# Patient Record
Sex: Male | Born: 1975 | Race: Black or African American | Hispanic: No | Marital: Single | Smoking: Current every day smoker
Health system: Southern US, Community
[De-identification: ages and names within clinical notes are randomized; demographics above are authoritative.]

## PROBLEM LIST (undated history)

## (undated) DIAGNOSIS — I10 Essential (primary) hypertension: Secondary | ICD-10-CM

## (undated) DIAGNOSIS — F329 Major depressive disorder, single episode, unspecified: Secondary | ICD-10-CM

## (undated) DIAGNOSIS — E119 Type 2 diabetes mellitus without complications: Secondary | ICD-10-CM

## (undated) DIAGNOSIS — F32A Depression, unspecified: Secondary | ICD-10-CM

---

## 2017-12-14 ENCOUNTER — Other Ambulatory Visit: Payer: Self-pay

## 2017-12-14 ENCOUNTER — Encounter (HOSPITAL_COMMUNITY): Payer: Self-pay | Admitting: *Deleted

## 2017-12-14 ENCOUNTER — Emergency Department (HOSPITAL_COMMUNITY)
Admission: EM | Admit: 2017-12-14 | Discharge: 2017-12-15 | Disposition: A | Discharge status: Other Acute Inpatient Hospital | Payer: BLUE CROSS/BLUE SHIELD | Attending: Emergency Medicine | Admitting: Emergency Medicine

## 2017-12-14 DIAGNOSIS — F333 Major depressive disorder, recurrent, severe with psychotic symptoms: Secondary | ICD-10-CM | POA: Insufficient documentation

## 2017-12-14 DIAGNOSIS — R45851 Suicidal ideations: Secondary | ICD-10-CM | POA: Insufficient documentation

## 2017-12-14 DIAGNOSIS — F329 Major depressive disorder, single episode, unspecified: Secondary | ICD-10-CM | POA: Diagnosis present

## 2017-12-14 HISTORY — DX: Depression, unspecified: F32.A

## 2017-12-14 HISTORY — DX: Major depressive disorder, single episode, unspecified: F32.9

## 2017-12-14 LAB — COMPREHENSIVE METABOLIC PANEL
ALBUMIN: 3.8 g/dL (ref 3.5–5.0)
ALT: 37 U/L (ref 17–63)
AST: 36 U/L (ref 15–41)
Alkaline Phosphatase: 65 U/L (ref 38–126)
Anion gap: 16 — ABNORMAL HIGH (ref 5–15)
BUN: 10 mg/dL (ref 6–20)
CHLORIDE: 98 mmol/L — AB (ref 101–111)
CO2: 23 mmol/L (ref 22–32)
CREATININE: 1.01 mg/dL (ref 0.61–1.24)
Calcium: 8.9 mg/dL (ref 8.9–10.3)
GFR calc non Af Amer: >60 mL/min (ref >60)
GLUCOSE: 94 mg/dL (ref 65–99)
Potassium: 3.6 mmol/L (ref 3.5–5.1)
SODIUM: 137 mmol/L (ref 135–145)
Total Bilirubin: 0.7 mg/dL (ref 0.3–1.2)
Total Protein: 7.4 g/dL (ref 6.5–8.1)

## 2017-12-14 LAB — SALICYLATE LEVEL

## 2017-12-14 LAB — CBC
HEMATOCRIT: 45.7 % (ref 39.0–52.0)
HEMOGLOBIN: 14.9 g/dL (ref 13.0–17.0)
MCH: 29.5 pg (ref 26.0–34.0)
MCHC: 32.6 g/dL (ref 30.0–36.0)
MCV: 90.5 fL (ref 78.0–100.0)
Platelets: 383 10*3/uL (ref 150–400)
RBC: 5.05 MIL/uL (ref 4.22–5.81)
RDW: 14.7 % (ref 11.5–15.5)
WBC: 7.8 10*3/uL (ref 4.0–10.5)

## 2017-12-14 LAB — ACETAMINOPHEN LEVEL: Acetaminophen (Tylenol), Serum: <10 ug/mL — ABNORMAL LOW (ref 10–30)

## 2017-12-14 LAB — ETHANOL: Alcohol, Ethyl (B): 88 mg/dL — ABNORMAL HIGH (ref <10)

## 2017-12-14 MED ORDER — ONDANSETRON HCL 4 MG PO TABS: 4.0000 mg | ORAL_TABLET | Freq: Three times a day (TID) | ORAL | Status: DC | PRN

## 2017-12-14 MED ORDER — ACETAMINOPHEN 325 MG PO TABS: 650.0000 mg | ORAL_TABLET | ORAL | Status: DC | PRN

## 2017-12-14 MED ORDER — ZOLPIDEM TARTRATE 5 MG PO TABS: 5.0000 mg | ORAL_TABLET | Freq: Every evening | ORAL | Status: DC | PRN

## 2017-12-14 NOTE — ED Notes (Addendum)
Called patient for triage, no answer

## 2017-12-14 NOTE — ED Provider Notes (Signed)
[facility] EMERGENCY [facility]PARTMENT Provider Note   CSN: [id number] Arrival date & time: 12/14/17  1526     History   Chief Complaint Chief Complaint  Patient presents with  . Suicidal  . Homicidal    HPI Aaron [name] is a 42 y.o. male.  Patient presents today for mental health evaluation. He endorses a prior history of depression, currently non-compliant with medications. He reports that he wants to kill himself, and placed a hand gun to head several times. He also endorses that he wants to harm others.   The history is provided by the patient. No language interpreter was used.  Mental Health Problem  Presenting symptoms: homicidal ideas, suicidal thoughts and suicidal threats   Degree of incapacity (severity):  Moderate Onset quality:  Unable to specify Progression:  Worsening Chronicity:  Recurrent Context: alcohol use   Treatment compliance:  Some of the time Time since last psychoactive medication taken:  2 months Associated symptoms: feelings of worthlessness and poor judgment   Risk factors: hx of mental illness     Past Medical History:  Diagnosis Date  . Depression     There are no active problems to display for this patient.   History reviewed. No pertinent surgical history.     Home Medications    Prior to Admission medications   Not on File    Family History History reviewed. No pertinent family history.  Social History Social History   Tobacco Use  . Smoking status: Never Smoker  Substance Use Topics  . Alcohol use: Yes    Comment: heavy  . Drug use: No     Allergies   Patient has no known allergies.   Review of Systems Review of Systems  Psychiatric/Behavioral: Positive for homicidal ideas and suicidal ideas.  All other systems reviewed and are negative.    Physical Exam Updated Vital Signs BP 132/85 (BP Location: Right Arm)   Pulse (!) 120   Temp 98.8 F (37.1 C) (Oral)   Resp 18   SpO2  96%   Physical Exam  Constitutional: He is oriented to person, place, and time. He appears well-developed.  HENT:  Head: Atraumatic.  Eyes: Conjunctivae are normal.  Neck: Neck supple.  Cardiovascular: Normal rate and regular rhythm.  Pulmonary/Chest: Effort normal and breath sounds normal.  Abdominal: Soft. Bowel sounds are normal.  Musculoskeletal: Normal range of motion.  Lymphadenopathy:    He has no cervical adenopathy.  Neurological: He is alert and oriented to person, place, and time.  Skin: Skin is warm and dry.  Psychiatric: His speech is normal. He is withdrawn. Cognition and memory are normal. He expresses impulsivity. He exhibits a depressed mood. He expresses homicidal and suicidal ideation. He expresses suicidal plans.  Nursing note and vitals reviewed.    ED Treatments / Results  Labs (all labs ordered are listed, but only abnormal results are displayed) Labs Reviewed  COMPREHENSIVE METABOLIC PANEL - Abnormal; Notable for the following components:      Result Value   Chloride 98 (*)    Anion gap 16 (*)    All other components within normal limits  ETHANOL - Abnormal; Notable for the following components:   Alcohol, Ethyl (B) 88 (*)    All other components within normal limits  ACETAMINOPHEN LEVEL - Abnormal; Notable for the following components:   Acetaminophen (Tylenol), Serum <10 (*)    All other components within normal limits  SALICYLATE LEVEL  CBC  RAPID URINE  DRUG SCREEN, HOSP PERFORMED    EKG  EKG Interpretation None       Radiology No results found.  Procedures Procedures (including critical care time)  Medications Ordered in ED Medications - No data to display   Initial Impression / Assessment and Plan / ED Course  I have reviewed the triage vital signs and the nursing notes.  Pertinent labs & imaging results that were available during my care of the patient were reviewed by me and considered in my medical decision making (see chart  for details).     Patient has been evaluated by TTS. Suicidal with psychosis. Meets criteria for inpatient admission and treatment. TTS working on placement. PATIENT IS CURRENTLY VOLUNTARILY SEEKING HELP, BUT SHOULD HE DECIDED TO LEAVE BEFORE ADMISSION, HE WILL NEED TO BE IVC'ED.  Final Clinical Impressions(s) / ED Diagnoses   Final diagnoses:  Suicidal ideation    ED Discharge Orders    None       Felicie Morn, NP 12/14/17 2114    Arby Barrette, MD 12/19/17 1712

## 2017-12-14 NOTE — ED Triage Notes (Signed)
Pt reports having thoughts of harming himself and others. Reports putting gun to his head multiple times today. Hx of same but denies taking meds as prescribed. Calm and cooperative at this time. Reports heavy ETOH use, last drank this am.
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
## 2017-12-14 NOTE — BH Assessment (Signed)
[facility] Assessment Progress Note   Sam[facility] called to say that they are accepting patient for voluntary admission.  Accepting physician is Dr. Merideth AbbeyEnrique Lopez.  [facility] and they can take him after 10:00 on 01/25.  Nurse call report to [phone].  Nurse Madison at [facility] notified.

## 2017-12-14 NOTE — Care Management (Signed)
Writer referred [facility] to the following [facility]: [facility], [facility], [facility], [facility], [facility], [facility], [facility]

## 2017-12-14 NOTE — ED Notes (Signed)
Ordered diet tray 

## 2017-12-14 NOTE — ED Notes (Signed)
Pt in paper scrubs, wanded at triage, belongings removed and called for a sitter.

## 2017-12-15 ENCOUNTER — Other Ambulatory Visit: Payer: Self-pay

## 2017-12-15 ENCOUNTER — Encounter (HOSPITAL_COMMUNITY): Payer: Self-pay

## 2017-12-15 ENCOUNTER — Inpatient Hospital Stay (HOSPITAL_COMMUNITY)
Admission: AD | Admit: 2017-12-15 | Discharge: 2017-12-22 | DRG: 885 | Disposition: A | Discharge status: Home / Self Care | Payer: BLUE CROSS/BLUE SHIELD | Source: Intra-hospital | Attending: Psychiatry | Admitting: Psychiatry

## 2017-12-15 DIAGNOSIS — F410 Panic disorder [episodic paroxysmal anxiety] without agoraphobia: Secondary | ICD-10-CM | POA: Diagnosis present

## 2017-12-15 DIAGNOSIS — Y904 Blood alcohol level of 80-99 mg/100 ml: Secondary | ICD-10-CM | POA: Diagnosis present

## 2017-12-15 DIAGNOSIS — F419 Anxiety disorder, unspecified: Secondary | ICD-10-CM | POA: Diagnosis not present

## 2017-12-15 DIAGNOSIS — R45851 Suicidal ideations: Secondary | ICD-10-CM | POA: Diagnosis present

## 2017-12-15 DIAGNOSIS — R450 Nervousness: Secondary | ICD-10-CM | POA: Diagnosis not present

## 2017-12-15 DIAGNOSIS — Z813 Family history of other psychoactive substance abuse and dependence: Secondary | ICD-10-CM | POA: Diagnosis not present

## 2017-12-15 DIAGNOSIS — Z590 Homelessness: Secondary | ICD-10-CM

## 2017-12-15 DIAGNOSIS — F17200 Nicotine dependence, unspecified, uncomplicated: Secondary | ICD-10-CM | POA: Diagnosis present

## 2017-12-15 DIAGNOSIS — G4700 Insomnia, unspecified: Secondary | ICD-10-CM | POA: Diagnosis present

## 2017-12-15 DIAGNOSIS — F333 Major depressive disorder, recurrent, severe with psychotic symptoms: Secondary | ICD-10-CM | POA: Diagnosis present

## 2017-12-15 DIAGNOSIS — F1024 Alcohol dependence with alcohol-induced mood disorder: Secondary | ICD-10-CM | POA: Diagnosis present

## 2017-12-15 DIAGNOSIS — R51 Headache: Secondary | ICD-10-CM | POA: Diagnosis not present

## 2017-12-15 DIAGNOSIS — Z811 Family history of alcohol abuse and dependence: Secondary | ICD-10-CM | POA: Diagnosis not present

## 2017-12-15 DIAGNOSIS — F17210 Nicotine dependence, cigarettes, uncomplicated: Secondary | ICD-10-CM | POA: Diagnosis not present

## 2017-12-15 DIAGNOSIS — R110 Nausea: Secondary | ICD-10-CM | POA: Diagnosis not present

## 2017-12-15 DIAGNOSIS — F39 Unspecified mood [affective] disorder: Secondary | ICD-10-CM | POA: Diagnosis not present

## 2017-12-15 MED ORDER — MAGNESIUM HYDROXIDE 400 MG/5ML PO SUSP: 30.0000 mL | Freq: Every day | ORAL | Status: DC | PRN

## 2017-12-15 MED ORDER — ONDANSETRON 4 MG PO TBDP
4.0000 mg | ORAL_TABLET | Freq: Four times a day (QID) | ORAL | Status: AC | PRN
  Administered 2017-12-17: 4 mg via ORAL
  Filled 2017-12-15: qty 1

## 2017-12-15 MED ORDER — CHLORDIAZEPOXIDE HCL 25 MG PO CAPS
25.0000 mg | ORAL_CAPSULE | ORAL | Status: AC
  Administered 2017-12-18 – 2017-12-19 (×2): 25 mg via ORAL
  Filled 2017-12-15 (×2): qty 1

## 2017-12-15 MED ORDER — CHLORDIAZEPOXIDE HCL 25 MG PO CAPS: 25.0000 mg | ORAL_CAPSULE | Freq: Four times a day (QID) | ORAL | Status: AC | PRN

## 2017-12-15 MED ORDER — ALUM & MAG HYDROXIDE-SIMETH 200-200-20 MG/5ML PO SUSP: 30.0000 mL | ORAL | Status: DC | PRN

## 2017-12-15 MED ORDER — ACETAMINOPHEN 325 MG PO TABS
650.0000 mg | ORAL_TABLET | Freq: Four times a day (QID) | ORAL | Status: DC | PRN
  Administered 2017-12-15 – 2017-12-19 (×3): 650 mg via ORAL
  Filled 2017-12-15 (×3): qty 2

## 2017-12-15 MED ORDER — LOPERAMIDE HCL 2 MG PO CAPS: 2.0000 mg | ORAL_CAPSULE | ORAL | Status: AC | PRN

## 2017-12-15 MED ORDER — CHLORDIAZEPOXIDE HCL 25 MG PO CAPS
25.0000 mg | ORAL_CAPSULE | Freq: Every day | ORAL | Status: AC
  Administered 2017-12-20: 25 mg via ORAL
  Filled 2017-12-15: qty 1

## 2017-12-15 MED ORDER — HYDROXYZINE HCL 25 MG PO TABS
25.0000 mg | ORAL_TABLET | Freq: Four times a day (QID) | ORAL | Status: AC | PRN
  Administered 2017-12-15 – 2017-12-16 (×2): 25 mg via ORAL
  Filled 2017-12-15 (×2): qty 1

## 2017-12-15 MED ORDER — CHLORDIAZEPOXIDE HCL 25 MG PO CAPS
25.0000 mg | ORAL_CAPSULE | Freq: Four times a day (QID) | ORAL | Status: AC
  Administered 2017-12-15 – 2017-12-17 (×6): 25 mg via ORAL
  Filled 2017-12-15 (×6): qty 1

## 2017-12-15 MED ORDER — CHLORDIAZEPOXIDE HCL 25 MG PO CAPS
25.0000 mg | ORAL_CAPSULE | Freq: Three times a day (TID) | ORAL | Status: AC
  Administered 2017-12-17 – 2017-12-18 (×3): 25 mg via ORAL
  Filled 2017-12-15 (×3): qty 1

## 2017-12-15 NOTE — ED Notes (Signed)
Carney [facility]Jean [facility] accepting is MD Cobos/ Nira ConnJason Berry

## 2017-12-15 NOTE — ED Notes (Signed)
[facility] .

## 2017-12-15 NOTE — ED Notes (Signed)
Pt belongings stored in locker number two and valuables sent with security

## 2017-12-15 NOTE — ED Notes (Signed)
Patient was offered a snack, but he only wanted a cup of water, and  A Regular Diet was ordered for Lunch.

## 2017-12-15 NOTE — Progress Notes (Signed)
Aaron [name] is a 42 year old male being admitted voluntarily to 302-2 from [facility].  He came to the ED for worsening depression and SI/HI.  He reported drinking daily over the last few weeks.  He is diagnosed with Major Depressive Disorder and Alcohol Use disorder.  [facility], he continues to voice suicidal ideation and will contract for safety on the unit.  He denies HI.  He admits to hearing voices that tell him to harm himself.  He denies any pain or discomfort and appears to be in no physical distress.  Oriented him to the unit.  Admission paperwork completed and signed.  Belongings searched and secured in locker # 26, no contraband found.  Skin assessment completed and no skin issues noted.  Q 15 minute checks initiated for safety.  We will continue to monitor the progress towards his goals.

## 2017-12-15 NOTE — Progress Notes (Addendum)
[facility] Nira ConnJason Berry, NP is the accepting provider.  Dr.Cobos is the attending provider.  Call report to [phone]  [email or web address] notified.   Pt is Voluntary.  Pt may be transported by [facility]  Pt scheduled  [facility]  Carney BernJean T. Kaylyn LimSutter, MSW, LCSWA Disposition Clinical Social Work [phone] (cell) [phone] (office)

## 2017-12-15 NOTE — ED Notes (Signed)
[facility] Made aware to expect pt there around 1 pm. Will fax over voluntary consent for treatment and place copy in medical records.

## 2017-12-15 NOTE — Progress Notes (Signed)
[facility] Group Notes:  (Nursing/MHT/Case Management/Adjunct)  Date:  12/15/2017  Time: 1545 Type of Therapy:  Nurse Education  Participation Level:  Active  Participation Quality:  Appropriate  Affect:  Flat  Cognitive:  Alert and Oriented  Insight:  Appropriate  Engagement in Group:  Engaged  Modes of Intervention:  Activity, Discussion, Education, Socialization and Support  Summary of Progress/Problems:The purpose of this group is to educate and introduce pt's to the benefits of aromatherapy. Pt was appropriate and participated during group.   Beatrix ShipperWright, Briscoe Daniello Martin 12/15/2017, 5:55 PM

## 2017-12-15 NOTE — ED Notes (Signed)
This RN called Number provided by [facility], [facility] states to have a referral sheet sent over to them and they would call me back. I called and spoke with social work at [facility] who states [facility] as there are open beds at this time. Will call me back with time [facility]

## 2017-12-15 NOTE — ED Notes (Signed)
Pts valuables were gathered from lockers and security and this RN helped escort by to [facility]. Copies of return of valuables placed in medical records and original paperwork notebook.

## 2017-12-15 NOTE — ED Notes (Signed)
[facility] Contacted and made aware [facility].

## 2017-12-15 NOTE — Tx Team (Signed)
Initial Treatment Plan 12/15/2017 2:05 PM Aaron [name] Butterbaugh III XBJ:[id number]    PATIENT STRESSORS: Medication change or noncompliance Occupational concerns Substance abuse   PATIENT STRENGTHS: Communication skills General fund of knowledge Motivation for treatment/growth Physical Health Work skills   PATIENT IDENTIFIED PROBLEMS: Depression  Suicidal ideation  Substance abuse  "I want to cope better and not have depression"  "Stop drinking"             DISCHARGE CRITERIA:  Improved stabilization in mood, thinking, and/or behavior Verbal commitment to aftercare and medication compliance  PRELIMINARY DISCHARGE PLAN: Outpatient therapy Medication management  PATIENT/FAMILY INVOLVEMENT: This treatment plan has been presented to and reviewed with the patient, Aaron [name] Spadoni III.  The patient and family have been given the opportunity to ask questions and make suggestions.  Levin BaconHeather V Kenzi Bardwell, RN 12/15/2017, 2:05 PM

## 2017-12-16 DIAGNOSIS — Z811 Family history of alcohol abuse and dependence: Secondary | ICD-10-CM

## 2017-12-16 DIAGNOSIS — R110 Nausea: Secondary | ICD-10-CM

## 2017-12-16 DIAGNOSIS — Z590 Homelessness: Secondary | ICD-10-CM

## 2017-12-16 DIAGNOSIS — F1024 Alcohol dependence with alcohol-induced mood disorder: Secondary | ICD-10-CM

## 2017-12-16 DIAGNOSIS — R51 Headache: Secondary | ICD-10-CM

## 2017-12-16 DIAGNOSIS — Z813 Family history of other psychoactive substance abuse and dependence: Secondary | ICD-10-CM

## 2017-12-16 MED ORDER — ESCITALOPRAM OXALATE 5 MG PO TABS
5.0000 mg | ORAL_TABLET | Freq: Every day | ORAL | Status: DC
  Administered 2017-12-17: 5 mg via ORAL
  Filled 2017-12-16 (×3): qty 1

## 2017-12-16 NOTE — Progress Notes (Signed)
Pt did not attend evening AA group, remained in bed resting. 

## 2017-12-16 NOTE — BHH Group Notes (Signed)
B[facility] Group Notes: (Clinical Social Work)   12/16/2017      Type of Therapy:  Group Therapy   Participation Level:  Did Not Attend despite MHT prompting   Zala Degrasse Grossman-Orr, LCSW 12/16/2017, 12:57 PM     

## 2017-12-16 NOTE — Progress Notes (Signed)
D.  Pt presented with flat affect but pleasant, no complaints voiced at this time.  Pt did not attend evening AA group, remained in bed.  Pt did get up for snacks later and was observed in dayroom with minimal but appropriate interaction.  Pt remains passively suicidal but contracts for safety on the unit.  Pt acknowledges HI towards those that make fun of him.  A.  Support and encouragement offered, medication given as ordered.  R.  Pt remains safe on the unit, will continue to monitor.

## 2017-12-16 NOTE — H&P (Signed)
Psychiatric Admission Assessment Adult  Patient Identification: Aaron [name] MRN:  [id number] Date of Evaluation:  12/16/2017 Chief Complaint:   Depression Principal Diagnosis: MDD, severe, with  psychotic symptoms, Alcohol Dependence, Alcohol Induced Mood Disorder, Depressed  Diagnosis:   Patient Active Problem List   Diagnosis Date Noted  . MDD (major depressive disorder), recurrent, severe, with psychosis ([facility]) [F33.3] 12/15/2017   History of Present Illness: 42 year old male, presented to ED voluntarily  due to worsening depression, suicidal ideations, with thoughts of shooting self .  Endorses neuro-vegetative symptoms of depression as below. Also endorses vague auditory hallucinations telling him he should die or kill self, but states " I know they are my own thoughts in my own head". At this time does not appear internally preoccupied .  He reports history of alcohol dependence and states he has been drinking more heavily recently especially since he has been out of work. States he has recently been drinking up to 2 pints of liquor per day .  States he feels symptoms of depression and increased alcohol consumption occurred after the death of his father 2 years ago. States he feels his family goes to him for help, advice ,which he states is also a stressor because " I am trying to keep it together myself " . Admission BAL 88.   Associated Signs/Symptoms: Depression Symptoms:  depressed mood, anhedonia, insomnia, suicidal thoughts with specific plan, loss of energy/fatigue, decreased appetite, has been isolating socially  (Hypo) Manic Symptoms:  None endorsed or noted  Anxiety Symptoms: reports increased anxiety recently  Psychotic Symptoms:  Reports auditory hallucinations telling him to hurt himself  PTSD Symptoms: Denies  Total Time spent with patient: 45 minutes  Past Psychiatric History: reports prior psychiatric admission about 8 months ago, for depression and  alcohol abuse . Denies history of suicide attempts, reports history of vague intermittent auditory hallucinations, as above . Denies history of mania or hypomania , denies agoraphobia. Reports history of occasional panic attacks. Denies history of violence   Is the patient at risk to self? Yes.    Has the patient been a risk to self in the past 6 months? Yes.    Has the patient been a risk to self within the distant past? No.  Is the patient a risk to others? No.  Has the patient been a risk to others in the past 6 months? No.  Has the patient been a risk to others within the distant past? No.   Prior Inpatient Therapy:  as above  Prior Outpatient Therapy:  not currently in therapy or being followed by a psychiatrist  Alcohol Screening: 1. How often do you have a drink containing alcohol?: 4 or more times a week 2. How many drinks containing alcohol do you have on a typical day when you are drinking?: 10 or more 3. How often do you have six or more drinks on one occasion?: Daily or almost daily AUDIT-C Score: 12 4. How often during the last year have you found that you were not able to stop drinking once you had started?: Weekly 5. How often during the last year have you failed to do what was normally expected from you becasue of drinking?: Monthly 6. How often during the last year have you needed a first drink in the morning to get yourself going after a heavy drinking session?: Never 7. How often during the last year have you had a feeling of guilt of remorse after drinking?: Daily  or almost daily 8. How often during the last year have you been unable to remember what happened the night before because you had been drinking?: Monthly 9. Have you or someone else been injured as a result of your drinking?: No 10. Has a relative or friend or a doctor or another health worker been concerned about your drinking or suggested you cut down?: Yes, during the last year Alcohol Use Disorder  Identification Test Final Score (AUDIT): 27 Intervention/Follow-up: Alcohol Education Substance Abuse History in the last 12 months: reports history of alcohol dependence, drinks on most days, up to 2 pints per day. Denies drug abuse . Consequences of Substance Abuse: History of DUIs in the past, no history of seizures, history of blackouts  Previous Psychotropic Medications: states he was on an antidepressant in the past last year, states he took it only briefly , does not remember name . Psychological Evaluations:  No  Past Medical History: denies medical illnesses  Past Medical History:  Diagnosis Date  . Depression    History reviewed. No pertinent surgical history. Family History: father deceased , died from colon cancer in 01-15-2015. Mother is alive . Has one brother .    Family Psychiatric  History: denies history of psychiatric illness in family, no suicidal attempts in family, brother had history of substance abuse . States he has uncles and cousins who drink heavily .  Tobacco Screening: smokes 1/2 PPD Social History: 42 year old male, separated, has one child aged 59 who lives with her mother, employed, currently on leave, currently homeless , had been staying at shelter .  Social History   Substance and Sexual Activity  Alcohol Use Yes   Comment: heavy     Social History   Substance and Sexual Activity  Drug Use No    Additional Social History: Marital status: Single Are you sexually active?: Yes What is your sexual orientation?: Straight Does patient have children?: Yes How many children?: 1 How is patient's relationship with their children?: 10yo daughter - has a good relationship, sees her 4 times a month  Allergies:  No Known Allergies Lab Results:  Results for orders placed or performed during the hospital encounter of 12/14/17 (from the past 48 hour(s))  Comprehensive metabolic panel     Status: Abnormal   Collection Time: 12/14/17  4:17 PM  Result Value Ref Range    Sodium 137 135 - 145 mmol/L   Potassium 3.6 3.5 - 5.1 mmol/L    Comment: SLIGHT HEMOLYSIS   Chloride 98 (L) 101 - 111 mmol/L   CO2 23 22 - 32 mmol/L   Glucose, Bld 94 65 - 99 mg/dL   BUN 10 6 - 20 mg/dL   Creatinine, Ser 1.01 0.61 - 1.24 mg/dL   Calcium 8.9 8.9 - 10.3 mg/dL   Total Protein 7.4 6.5 - 8.1 g/dL   Albumin 3.8 3.5 - 5.0 g/dL   AST 36 15 - 41 U/L   ALT 37 17 - 63 U/L   Alkaline Phosphatase 65 38 - 126 U/L   Total Bilirubin 0.7 0.3 - 1.2 mg/dL   GFR calc non Af Amer >60 >60 mL/min   GFR calc Af Amer >60 >60 mL/min    Comment: (NOTE) The eGFR has been calculated using the CKD EPI equation. This calculation has not been validated in all clinical situations. eGFR's persistently <60 mL/min signify possible Chronic Kidney Disease.    Anion gap 16 (H) 5 - 15  Ethanol  Status: Abnormal   Collection Time: 12/14/17  4:17 PM  Result Value Ref Range   Alcohol, Ethyl (B) 88 (H) <10 mg/dL    Comment:        LOWEST DETECTABLE LIMIT FOR SERUM ALCOHOL IS 10 mg/dL FOR MEDICAL PURPOSES ONLY   Salicylate level     Status: None   Collection Time: 12/14/17  4:17 PM  Result Value Ref Range   Salicylate Lvl <2.8 2.8 - 30.0 mg/dL  Acetaminophen level     Status: Abnormal   Collection Time: 12/14/17  4:17 PM  Result Value Ref Range   Acetaminophen (Tylenol), Serum <10 (L) 10 - 30 ug/mL    Comment:        THERAPEUTIC CONCENTRATIONS VARY SIGNIFICANTLY. A RANGE OF 10-30 ug/mL MAY BE AN EFFECTIVE CONCENTRATION FOR MANY PATIENTS. HOWEVER, SOME ARE BEST TREATED AT CONCENTRATIONS OUTSIDE THIS RANGE. ACETAMINOPHEN CONCENTRATIONS >150 ug/mL AT 4 HOURS AFTER INGESTION AND >50 ug/mL AT 12 HOURS AFTER INGESTION ARE OFTEN ASSOCIATED WITH TOXIC REACTIONS.   cbc     Status: None   Collection Time: 12/14/17  4:17 PM  Result Value Ref Range   WBC 7.8 4.0 - 10.5 K/uL   RBC 5.05 4.22 - 5.81 MIL/uL   Hemoglobin 14.9 13.0 - 17.0 g/dL   HCT 45.7 39.0 - 52.0 %   MCV 90.5 78.0 - 100.0  fL   MCH 29.5 26.0 - 34.0 pg   MCHC 32.6 30.0 - 36.0 g/dL   RDW 14.7 11.5 - 15.5 %   Platelets 383 150 - 400 K/uL    Blood Alcohol level:  Lab Results  Component Value Date   ETH 88 (H) 41/32/4401    Metabolic Disorder Labs:  No results found for: HGBA1C, MPG No results found for: PROLACTIN No results found for: CHOL, TRIG, HDL, CHOLHDL, VLDL, LDLCALC  Current Medications: Current Facility-Administered Medications  Medication Dose Route Frequency Provider Last Rate Last Dose  . acetaminophen (TYLENOL) tablet 650 mg  650 mg Oral Q6H PRN Rankin, Shuvon B, NP   650 mg at 12/15/17 1711  . alum & mag hydroxide-simeth (MAALOX/MYLANTA) 200-200-20 MG/5ML suspension 30 mL  30 mL Oral Q4H PRN Rankin, Shuvon B, NP      . chlordiazePOXIDE (LIBRIUM) capsule 25 mg  25 mg Oral Q6H PRN Lindon Romp A, NP      . chlordiazePOXIDE (LIBRIUM) capsule 25 mg  25 mg Oral QID Lindon Romp A, NP   25 mg at 12/16/17 1149   Followed by  . [START ON 12/17/2017] chlordiazePOXIDE (LIBRIUM) capsule 25 mg  25 mg Oral TID Rozetta Nunnery, NP       Followed by  . [START ON 12/18/2017] chlordiazePOXIDE (LIBRIUM) capsule 25 mg  25 mg Oral BH-qamhs Rozetta Nunnery, NP       Followed by  . [START ON 12/20/2017] chlordiazePOXIDE (LIBRIUM) capsule 25 mg  25 mg Oral Daily Lindon Romp A, NP      . hydrOXYzine (ATARAX/VISTARIL) tablet 25 mg  25 mg Oral Q6H PRN Lindon Romp A, NP   25 mg at 12/15/17 2139  . loperamide (IMODIUM) capsule 2-4 mg  2-4 mg Oral PRN Lindon Romp A, NP      . magnesium hydroxide (MILK OF MAGNESIA) suspension 30 mL  30 mL Oral Daily PRN Rankin, Shuvon B, NP      . ondansetron (ZOFRAN-ODT) disintegrating tablet 4 mg  4 mg Oral Q6H PRN Rozetta Nunnery, NP       PTA  Medications: No medications prior to admission.    Musculoskeletal: Strength & Muscle Tone: within normal limits Gait & Station: normal Patient leans: N/A  Psychiatric Specialty Exam: Physical Exam  Review of Systems  Constitutional:  Negative.   HENT: Negative.   Eyes: Negative.   Respiratory: Negative.   Cardiovascular: Negative.   Gastrointestinal: Positive for nausea. Negative for blood in stool, diarrhea and vomiting.  Genitourinary: Negative.   Musculoskeletal: Negative.   Skin: Negative.   Neurological: Positive for headaches. Negative for seizures.  Endo/Heme/Allergies: Negative.   Psychiatric/Behavioral: Positive for depression, hallucinations, substance abuse and suicidal ideas.    Blood pressure 130/79, pulse 95, temperature 98.2 F (36.8 C), temperature source Oral, resp. rate 18, height 5' (1.524 m), weight 70.8 kg (156 lb).Body mass index is 30.47 kg/m.  General Appearance: Well Groomed  Eye Contact:  Fair  Speech:  Normal Rate  Volume:  Decreased  Mood:  Depressed  Affect:  constricted, depressed   Thought Process:  Linear and Descriptions of Associations: Intact  Orientation:  Other:  fully alert and attentive   Thought Content:  reports vague auditory hallucinations as above, but states he is aware these are his own thoughts , does not appear internally preoccupied, no delusions expressed  Suicidal Thoughts:  No he denies current suicidal plan or intention on unit and currently denies any homicidal or violent ideations  Homicidal Thoughts:  No  Memory:  recent and remote grossly intact   Judgement:  Fair  Insight:  Fair  Psychomotor Activity:  Normal- no tremors , no diaphoresis, no acute distress.  Concentration:  Concentration: Good and Attention Span: Good  Recall:  Good  Fund of Knowledge:  Good  Language:  Good  Akathisia:  Negative  Handed:  Left  AIMS (if indicated):     Assets:  Desire for Improvement Resilience  ADL's:  Intact  Cognition:  WNL  Sleep:  Number of Hours: 6.5    Treatment Plan Summary: Daily contact with patient to assess and evaluate symptoms and progress in treatment, Medication management, Plan inpatient treatment  and medications as below  Observation  Level/Precautions:  15 minute checks  Laboratory:  as needed  TSH   Psychotherapy: milieu, group therapy     Medications:  Currently on librium detox protocol, which he is tolerating well thus far .  Agrees to antidepressant trial, will start Lexapro 5 mgrs QDAY initially .   Consultations:  As needed  Discharge Concerns:  - states he is interested in going to a rehab or [facility] at discharge  Estimated LOS: 4 days   Other:     Physician Treatment Plan for Primary Diagnosis: Major Depression, Severe  Long Term Goal(s): Improvement in symptoms so as ready for discharge  Short Term Goals: Ability to identify changes in lifestyle to reduce recurrence of condition will improve and Ability to maintain clinical measurements within normal limits will improve  Physician Treatment Plan for Secondary Diagnosis:  Alcohol Dependence  Long Term Goal(s): Improvement in symptoms so as ready for discharge  Short Term Goals: Ability to identify triggers associated with substance abuse/mental health issues will improve  I certify that inpatient services furnished can reasonably be expected to improve the patient's condition.    Jenne Campus, MD 1/26/20192:16 PM

## 2017-12-16 NOTE — Progress Notes (Signed)
D: Patient is soft spoken with flat, blunted affect.  He has poor eye contact and minimal interaction with staff.  He did attend group this morning and he was encouraged to fill out his self inventory.  Patient reports passive thoughts of self harm and contracts for safety on the unit.  He denies any homicidal ideation or auditory/visual hallucinations.  Patient is on librium protocol and reports minimal withdrawal symptoms.  A: Continue to monitor medication management and MD orders.  Safety checks continued every 15 minutes per protocol.  Offer support and encouragement as needed.  R: Patient has minimal interaction with staff and peers.

## 2017-12-16 NOTE — [facility] Suicide Risk Assessment (Signed)
[facility] INPATIENT:  Family/Significant Other Suicide Prevention Education  Suicide Prevention Education:  Patient Refusal for Family/Significant Other Suicide Prevention Education: The patient Aaron [name] has refused to provide written consent for family/significant other to be provided Family/Significant Other Suicide Prevention Education during admission and/or prior to discharge.  Physician notified.  Brochure was reviewed in detail with patient and given to him.  Carloyn JaegerMareida J Grossman-Orr 12/16/2017, 9:55 AM

## 2017-12-16 NOTE — Plan of Care (Signed)
  Progressing Activity: Interest or engagement in activities will improve 12/16/2017 1209 - Progressing by Angela AdamBeaudry, Kenyonna Micek E, RN Sleeping patterns will improve 12/16/2017 1209 - Progressing by Angela AdamBeaudry, Romney Compean E, RN
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
## 2017-12-16 NOTE — [facility] Suicide Risk Assessment (Signed)
[facility]   Nursing information obtained from:  Patient Demographic factors:  Male, Living alone Current Mental Status:  Suicidal ideation indicated by patient Loss Factors:  Financial problems / change in socioeconomic status Historical Factors:  Prior suicide attempts Risk Reduction Factors:  NA  Total Time spent with patient: 45 minutes Principal Problem:  MDD, Alcohol Dependence  Diagnosis:   Patient Active Problem List   Diagnosis Date Noted  . MDD (major depressive disorder), recurrent, severe, with psychosis ([facility]) [F33.3] 12/15/2017    Continued Clinical Symptoms:  Alcohol Use Disorder Identification Test Final Score (AUDIT): 27 The "Alcohol Use Disorders Identification Test", Guidelines for Use in Primary Care, Second Edition.  World Science writerHealth Organization [facility](WHO). Score between 0-7:  no or low risk or alcohol related problems. Score between 8-15:  moderate risk of alcohol related problems. Score between 16-19:  high risk of alcohol related problems. Score 20 or above:  warrants further diagnostic evaluation for alcohol dependence and treatment.   CLINICAL FACTORS:  42 year old male, presented due to worsening depression and suicidal ideations. Reports heavy, daily drinking, up to two pints per day over recent weeks. Vitals currently stable, and no tremors or diaphoresis noted at this time.   Psychiatric Specialty Exam: Physical Exam  ROS  Blood pressure 130/79, pulse 95, temperature 98.2 F (36.8 C), temperature source Oral, resp. rate 18, height 5' (1.524 m), weight 70.8 kg (156 lb).Body mass index is 30.47 kg/m.   see admit note MSE  COGNITIVE FEATURES THAT CONTRIBUTE TO RISK:  Closed-mindedness and Loss of executive function    SUICIDE RISK:   Moderate:  Frequent suicidal ideation with limited intensity, and duration, some specificity in terms of plans, no associated intent, good self-control, limited dysphoria/symptomatology, some risk  factors present, and identifiable protective factors, including available and accessible social support.  PLAN OF CARE: Patient will be admitted to inpatient psychiatric unit for stabilization and safety. Will provide and encourage milieu participation. Provide medication management and maked adjustments as needed.  Will also provide medication management to minimize risk of alcohol WDL. Will follow daily.    I certify that inpatient services furnished can reasonably be expected to improve the patient's condition.   Craige CottaFernando A Emberlyn Burlison, MD 12/16/2017, 2:16 PM

## 2017-12-16 NOTE — Progress Notes (Signed)
Pt new to the unit this afternoon.  Pt has been in bed most of the evening.  He contracts for safety this evening, and denies HI/AVH at this time.  Received orders for Librium protocol and meds given.  Pt encouraged to make needs known to staff.  Support and encouragement offered.  Discharge plans are in process.  Safety maintained with q15 minute checks.

## 2017-12-17 LAB — TSH: TSH: 2.426 u[IU]/mL (ref 0.350–4.500)

## 2017-12-17 MED ORDER — ESCITALOPRAM OXALATE 10 MG PO TABS
10.0000 mg | ORAL_TABLET | Freq: Every day | ORAL | Status: DC
  Administered 2017-12-18 – 2017-12-22 (×5): 10 mg via ORAL
  Filled 2017-12-17 (×7): qty 1

## 2017-12-17 MED ORDER — TRAZODONE HCL 50 MG PO TABS
50.0000 mg | ORAL_TABLET | Freq: Every evening | ORAL | Status: DC | PRN
  Administered 2017-12-17: 50 mg via ORAL
  Filled 2017-12-17 (×4): qty 1

## 2017-12-17 NOTE — Progress Notes (Signed)
[facility] MD Progress Note  12/17/2017 12:34 PM Aaron [name]  MRN:  [id number]   Subjective:  Patient reports that slept ok but woke up a couple of times throughout the night. He reports minimal withdrawal symptoms. He states that he has some passive SI, but denies any HI/AVH and contracts for safety. He still feels depressed and has some mild anxiety. He is concerned about his after care because he is from [place] and doesn't want to go back to [facility] and he has been told there may not be a rehab facility around this area for him.   Objective: Patient's chart and findings reviewed and discussed with treatment team. Patient  Presents in the day room and is pleasant and cooperative. He has a flat affect upon approach. Will increase the Lexapro to 10 mg Daily and continue all other medications as prescribed.   Principal Problem: MDD (major depressive disorder), recurrent, severe, with psychosis (HCC) Diagnosis:   Patient Active Problem List   Diagnosis Date Noted  . MDD (major depressive disorder), recurrent, severe, with psychosis (HCC) [F33.3] 12/15/2017   Total Time spent with patient: 25 minutes  Past Psychiatric History: See H&P  Past Medical History:  Past Medical History:  Diagnosis Date  . Depression    History reviewed. No pertinent surgical history. Family History: History reviewed. No pertinent family history. Family Psychiatric  History: See H&P Social History:  Social History   Substance and Sexual Activity  Alcohol Use Yes   Comment: heavy     Social History   Substance and Sexual Activity  Drug Use No    Social History   Socioeconomic History  . Marital status: Single    Spouse name: None  . Number of children: None  . Years of education: None  . Highest education level: None  Social Needs  . Financial resource strain: None  . Food insecurity - worry: None  . Food insecurity - inability: None  . Transportation needs - medical: None  .  Transportation needs - non-medical: None  Occupational History  . None  Tobacco Use  . Smoking status: Current Every Day Smoker  . Smokeless tobacco: Never Used  Substance and Sexual Activity  . Alcohol use: Yes    Comment: heavy  . Drug use: No  . Sexual activity: None  Other Topics Concern  . None  Social History Narrative  . None   Additional Social History:                         Sleep: Fair  Appetite:  Good  Current Medications: Current Facility-Administered Medications  Medication Dose Route Frequency Provider Last Rate Last Dose  . acetaminophen (TYLENOL) tablet 650 mg  650 mg Oral Q6H PRN Rankin, Shuvon B, NP   650 mg at 12/15/17 1711  . alum & mag hydroxide-simeth (MAALOX/MYLANTA) 200-200-20 MG/5ML suspension 30 mL  30 mL Oral Q4H PRN Rankin, Shuvon B, NP      . chlordiazePOXIDE (LIBRIUM) capsule 25 mg  25 mg Oral Q6H PRN Nira Conn A, NP      . chlordiazePOXIDE (LIBRIUM) capsule 25 mg  25 mg Oral TID Nira Conn A, NP   25 mg at 12/17/17 1215   Followed by  . [START ON 12/18/2017] chlordiazePOXIDE (LIBRIUM) capsule 25 mg  25 mg Oral BH-qamhs Jackelyn Poling, NP       Followed by  . [START ON 12/20/2017] chlordiazePOXIDE (LIBRIUM) capsule 25 mg  25 mg Oral Daily Nira ConnBerry, Jason A, NP      . escitalopram (LEXAPRO) tablet 5 mg  5 mg Oral Daily Loralye Loberg, Rockey SituFernando A, MD   5 mg at 12/17/17 0911  . hydrOXYzine (ATARAX/VISTARIL) tablet 25 mg  25 mg Oral Q6H PRN Nira ConnBerry, Jason A, NP   25 mg at 12/16/17 2151  . loperamide (IMODIUM) capsule 2-4 mg  2-4 mg Oral PRN Nira ConnBerry, Jason A, NP      . magnesium hydroxide (MILK OF MAGNESIA) suspension 30 mL  30 mL Oral Daily PRN Rankin, Shuvon B, NP      . ondansetron (ZOFRAN-ODT) disintegrating tablet 4 mg  4 mg Oral Q6H PRN Jackelyn PolingBerry, Jason A, NP   4 mg at 12/17/17 1216    Lab Results:  Results for orders placed or performed during the hospital encounter of 12/15/17 (from the past 48 hour(s))  TSH     Status: None   Collection Time:  12/17/17  6:40 AM  Result Value Ref Range   TSH 2.426 0.350 - 4.500 uIU/mL    Comment: Performed by a 3rd Generation assay with a functional sensitivity of <=0.01 uIU/mL. Performed at [facility], [address] [place], [place] [address]     Blood Alcohol level:  Lab Results  Component Value Date   ETH 2588 (H) 12/14/2017    Metabolic Disorder Labs: No results found for: HGBA1C, MPG No results found for: PROLACTIN No results found for: CHOL, TRIG, HDL, CHOLHDL, VLDL, LDLCALC  Physical Findings: AIMS: Facial and Oral Movements Muscles of Facial Expression: None, normal Lips and Perioral Area: None, normal Jaw: None, normal Tongue: None, normal,Extremity Movements Upper (arms, wrists, hands, fingers): None, normal Lower (legs, knees, ankles, toes): None, normal, Trunk Movements Neck, shoulders, hips: None, normal, Overall Severity Severity of abnormal movements (highest score from questions above): None, normal Incapacitation due to abnormal movements: None, normal Patient's awareness of abnormal movements (rate only patient's report): No Awareness, Dental Status Current problems with teeth and/or dentures?: No Does patient usually wear dentures?: No  CIWA:  CIWA-Ar Total: 5 COWS:     Musculoskeletal: Strength & Muscle Tone: within normal limits Gait & Station: normal Patient leans: N/A  Psychiatric Specialty Exam: Physical Exam  Nursing note and vitals reviewed. Constitutional: He is oriented to person, place, and time. He appears well-developed and well-nourished.  Cardiovascular: Normal rate.  Respiratory: Effort normal.  Musculoskeletal: Normal range of motion.  Neurological: He is alert and oriented to person, place, and time.  Skin: Skin is warm.    Review of Systems  Constitutional: Negative.   HENT: Negative.   Eyes: Negative.   Respiratory: Negative.   Cardiovascular: Negative.   Gastrointestinal: Negative.   Genitourinary:  Negative.   Musculoskeletal: Negative.   Skin: Negative.   Neurological: Negative.   Endo/Heme/Allergies: Negative.   Psychiatric/Behavioral: Positive for depression and suicidal ideas. The patient is nervous/anxious.     Blood pressure 117/83, pulse 92, temperature 99.4 F (37.4 C), temperature source Oral, resp. rate 16, height 5' (1.524 m), weight 70.8 kg (156 lb).Body mass index is 30.47 kg/m.  General Appearance: Casual  Eye Contact:  Good  Speech:  Clear and Coherent and Normal Rate  Volume:  Normal  Mood:  Depressed  Affect:  Flat  Thought Process:  Goal Directed and Descriptions of Associations: Intact  Orientation:  Full (Time, Place, and Person)  Thought Content:  WDL  Suicidal Thoughts:  Yes.  without intent/plan  Homicidal Thoughts:  No  Memory:  Immediate;   Good Recent;   Good Remote;   Good  Judgement:  Fair  Insight:  Good  Psychomotor Activity:  Normal  Concentration:  Concentration: Good and Attention Span: Good  Recall:  Good  Fund of Knowledge:  Good  Language:  Good  Akathisia:  No  Handed:  Right  AIMS (if indicated):     Assets:  Communication Skills Desire for Improvement Financial Resources/Insurance Physical Health Social Support  ADL's:  Intact  Cognition:  WNL  Sleep:  Number of Hours: 6.25   Problems Addressed: MDD severe  Treatment Plan Summary: Daily contact with patient to assess and evaluate symptoms and progress in treatment, Medication management and Plan is to:  -Increase Lexapro 10 mg PO Daily for mood stability -Continue Librium Detox Protocol  -Continue Vistaril 25 mg Q6H PRN for anxiety -Encourage group therapy participation  Maryfrances Bunnell, FNP 12/17/2017, 12:34 PM   Agree with NP Progress Note

## 2017-12-17 NOTE — [facility] Group Notes (Signed)
[facility]  Date/Time:  12/17/2017  10:00-11:00AM  Type of Therapy and Topic:  Group Therapy:  Music and Mood  Participation Level:  Active   Description of Group: In this process group, members listened to a variety of genres of music and identified that different types of music evoke different responses.  Patients were encouraged to identify music that was soothing for them and music that was energizing for them.  Patients discussed how this knowledge can help with wellness and recovery in various ways including managing depression and anxiety as well as encouraging healthy sleep habits.    Therapeutic Goals: 1. Patients will explore the impact of different varieties of music on mood 2. Patients will verbalize the thoughts they have when listening to different types of music 3. Patients will identify music that is soothing to them as well as music that is energizing to them 4. Patients will discuss how to use this knowledge to assist in maintaining wellness and recovery 5. Patients will explore the use of music as a coping skill  Summary of Patient Progress:  At the beginning of group, patient expressed he felt anxious and at the end of group said he felt a little better.  He participated very heavily and kept up with the worksheet he was given throughout group.  Therapeutic Modalities: Solution Focused Brief Therapy Activity   Ambrose MantleMareida Grossman-Orr, LCSW

## 2017-12-17 NOTE — Progress Notes (Signed)
Patient ID: Aaron [name], male   DOB: 09/04/1976, 42 y.o.   MRN: [id number]  DAR: Pt. Denies SI/HI and A/V Hallucinations. He reports that his sleep was fair last night, his appetite is fair, his energy level is normal, and his concentration is good. He rates depression 8/10, his hopelessness level is 6/10, and anxiety level is 6/10. Support and encouragement provided to the patient. Scheduled medications administered to patient per physician's order. He reports nausea and irritability as part of his withdrawal symptoms. Patient is minimal but cooperative with Clinical research associatewriter. He is seen intermittently in the milieu. Q15 minute checks are maintained for safety.

## 2017-12-18 DIAGNOSIS — R45851 Suicidal ideations: Secondary | ICD-10-CM

## 2017-12-18 DIAGNOSIS — G4700 Insomnia, unspecified: Secondary | ICD-10-CM

## 2017-12-18 DIAGNOSIS — F333 Major depressive disorder, recurrent, severe with psychotic symptoms: Principal | ICD-10-CM

## 2017-12-18 DIAGNOSIS — F17210 Nicotine dependence, cigarettes, uncomplicated: Secondary | ICD-10-CM

## 2017-12-18 DIAGNOSIS — F419 Anxiety disorder, unspecified: Secondary | ICD-10-CM

## 2017-12-18 DIAGNOSIS — F39 Unspecified mood [affective] disorder: Secondary | ICD-10-CM

## 2017-12-18 DIAGNOSIS — R450 Nervousness: Secondary | ICD-10-CM

## 2017-12-18 MED ORDER — TRAZODONE HCL 100 MG PO TABS
100.0000 mg | ORAL_TABLET | Freq: Every evening | ORAL | Status: DC | PRN
  Administered 2017-12-18 – 2017-12-21 (×5): 100 mg via ORAL
  Filled 2017-12-18 (×10): qty 1

## 2017-12-18 NOTE — Progress Notes (Signed)
Patient attended AA group meeting tonight.  

## 2017-12-18 NOTE — Progress Notes (Signed)
[facility] MD Progress Note  12/18/2017 11:11 AM Aaron [name]  MRN:  [id number]   Subjective:  Ollin reports " I am still feeling depressed with on and off thoughts of death, I am not ready to discharge yet."  Objective: Aaron [name] is awake, alert and oriented . Patient present with a flat and guarded affect.  Reports passive thoughts of suicidal ideations. Denies homicidal ideations, auditory or visual hallucination visual hallucination and does not appear to be responding to internal stimuli. Patient reports hearing voices however reports the voices has improved since admission. Noted this is the first mention of auditory hallucination. Noted that patient had a recent medication adjustment.  Report his depression 10/10 today. States he hasn't been resting well thorough the night. Will adjust trazodone. Support, encouragement and reassurance was provided.     Principal Problem: MDD (major depressive disorder), recurrent, severe, with psychosis (HCC) Diagnosis:   Patient Active Problem List   Diagnosis Date Noted  . MDD (major depressive disorder), recurrent, severe, with psychosis (HCC) [F33.3] 12/15/2017   Total Time spent with patient: 25 minutes  Past Psychiatric History: See H&P  Past Medical History:  Past Medical History:  Diagnosis Date  . Depression    History reviewed. No pertinent surgical history. Family History: History reviewed. No pertinent family history. Family Psychiatric  History: See H&P Social History:  Social History   Substance and Sexual Activity  Alcohol Use Yes   Comment: heavy     Social History   Substance and Sexual Activity  Drug Use No    Social History   Socioeconomic History  . Marital status: Single    Spouse name: None  . Number of children: None  . Years of education: None  . Highest education level: None  Social Needs  . Financial resource strain: None  . Food insecurity - worry: None  . Food insecurity -  inability: None  . Transportation needs - medical: None  . Transportation needs - non-medical: None  Occupational History  . None  Tobacco Use  . Smoking status: Current Every Day Smoker  . Smokeless tobacco: Never Used  Substance and Sexual Activity  . Alcohol use: Yes    Comment: heavy  . Drug use: No  . Sexual activity: None  Other Topics Concern  . None  Social History Narrative  . None   Additional Social History:                         Sleep: Poor  Appetite:  Good  Current Medications: Current Facility-Administered Medications  Medication Dose Route Frequency Provider Last Rate Last Dose  . acetaminophen (TYLENOL) tablet 650 mg  650 mg Oral Q6H PRN Rankin, Shuvon B, NP   650 mg at 12/15/17 1711  . alum & mag hydroxide-simeth (MAALOX/MYLANTA) 200-200-20 MG/5ML suspension 30 mL  30 mL Oral Q4H PRN Rankin, Shuvon B, NP      . chlordiazePOXIDE (LIBRIUM) capsule 25 mg  25 mg Oral Q6H PRN Nira Conn A, NP      . chlordiazePOXIDE (LIBRIUM) capsule 25 mg  25 mg Oral BH-qamhs Jackelyn Poling, NP       Followed by  . [START ON 12/20/2017] chlordiazePOXIDE (LIBRIUM) capsule 25 mg  25 mg Oral Daily Nira Conn A, NP      . escitalopram (LEXAPRO) tablet 10 mg  10 mg Oral Daily Money, Gerlene Burdock, FNP   10 mg at 12/18/17 4098  .  hydrOXYzine (ATARAX/VISTARIL) tablet 25 mg  25 mg Oral Q6H PRN Nira Conn A, NP   25 mg at 12/16/17 2151  . loperamide (IMODIUM) capsule 2-4 mg  2-4 mg Oral PRN Nira Conn A, NP      . magnesium hydroxide (MILK OF MAGNESIA) suspension 30 mL  30 mL Oral Daily PRN Rankin, Shuvon B, NP      . ondansetron (ZOFRAN-ODT) disintegrating tablet 4 mg  4 mg Oral Q6H PRN Nira Conn A, NP   4 mg at 12/17/17 1216  . traZODone (DESYREL) tablet 100 mg  100 mg Oral QHS,MR X 1 Oneta Rack, NP        Lab Results:  Results for orders placed or performed during the hospital encounter of 12/15/17 (from the past 48 hour(s))  TSH     Status: None   Collection  Time: 12/17/17  6:40 AM  Result Value Ref Range   TSH 2.426 0.350 - 4.500 uIU/mL    Comment: Performed by a 3rd Generation assay with a functional sensitivity of <=0.01 uIU/mL. Performed at [facility], [address] [place], [place] [address]     Blood Alcohol level:  Lab Results  Component Value Date   ETH 8 (H) 12/14/2017    Metabolic Disorder Labs: No results found for: HGBA1C, MPG No results found for: PROLACTIN No results found for: CHOL, TRIG, HDL, CHOLHDL, VLDL, LDLCALC  Physical Findings: AIMS: Facial and Oral Movements Muscles of Facial Expression: None, normal Lips and Perioral Area: None, normal Jaw: None, normal Tongue: None, normal,Extremity Movements Upper (arms, wrists, hands, fingers): None, normal Lower (legs, knees, ankles, toes): None, normal, Trunk Movements Neck, shoulders, hips: None, normal, Overall Severity Severity of abnormal movements (highest score from questions above): None, normal Incapacitation due to abnormal movements: None, normal Patient's awareness of abnormal movements (rate only patient's report): No Awareness, Dental Status Current problems with teeth and/or dentures?: No Does patient usually wear dentures?: No  CIWA:  CIWA-Ar Total: 2 COWS:     Musculoskeletal: Strength & Muscle Tone: within normal limits Gait & Station: normal Patient leans: N/A  Psychiatric Specialty Exam: Physical Exam  Nursing note and vitals reviewed. Constitutional: He is oriented to person, place, and time. He appears well-developed and well-nourished.  Cardiovascular: Normal rate.  Respiratory: Effort normal.  Musculoskeletal: Normal range of motion.  Neurological: He is alert and oriented to person, place, and time.  Skin: Skin is warm.    Review of Systems  Psychiatric/Behavioral: Positive for depression and suicidal ideas. The patient is nervous/anxious and has insomnia.   All other systems reviewed and are negative.    Blood pressure 116/66, pulse 90, temperature 97.8 F (36.6 C), temperature source Oral, resp. rate 18, height 5' (1.524 m), weight 70.8 kg (156 lb).Body mass index is 30.47 kg/m.  General Appearance: Casual and Guarded and flat  Eye Contact:  Good  Speech:  Clear and Coherent and Normal Rate  Volume:  Normal  Mood:  Depressed  Affect:  Flat  Thought Process:  Goal Directed and Descriptions of Associations: Intact  Orientation:  Full (Time, Place, and Person)  Thought Content:  WDL  Suicidal Thoughts:  Yes.  without intent/plan  Homicidal Thoughts:  No  Memory:  Immediate;   Good Recent;   Good Remote;   Good  Judgement:  Fair  Insight:  Good  Psychomotor Activity:  Normal  Concentration:  Concentration: Good and Attention Span: Good  Recall:  Good  Fund of Knowledge:  Good  Language:  Good  Akathisia:  No  Handed:  Right  AIMS (if indicated):     Assets:  Communication Skills Desire for Improvement Financial Resources/Insurance Physical Health Social Support  ADL's:  Intact  Cognition:  WNL  Sleep:  Number of Hours: 5.75   Problems Addressed: MDD severe  Treatment Plan Summary: Daily contact with patient to assess and evaluate symptoms and progress in treatment, Medication management and Plan is to:   Continue with current treatment plan 12/18/2017 except where noted   Mood stability   - Contiune Lexapro 10 mg PO Daily  Insomnia:  - Increased Trazdone 50 mg to 100 mg PRN with repeat dose   -Continue Librium Detox Protocol  -Continue Vistaril 25 mg Q6H PRN for anxiety -Encourage group therapy participation - CSW to continue with disposition   Oneta Rackanika N Lewis, NP 12/18/2017, 11:11 AM   Agree with NP Progress Note

## 2017-12-18 NOTE — Progress Notes (Signed)
D: Pt was quiet and didn't go into detail about his day or how he was feeling. However, pt did complain of sleep disturbance. Pt has no other questions or concerns.   A:  Support and encouragement was offered. 15 min checks continued for safety.  R: Pt remains safe.

## 2017-12-18 NOTE — Progress Notes (Signed)
D: Pt was in his bedroom upon initial approach.  Pt presents with depressed affect and mood.  He reports his day was "alright" and his goal is "planning on sleeping okay" and "going to group."  Pt denies SI/HI, denies hallucinations, denies pain.  Pt has been visible in milieu interacting with peers and staff minimally.  He forwards little information.  Pt attended evening group.    A: Introduced self to pt.  Actively listened to pt and offered support and encouragement. Medications administered per order.  Q15 minute safety checks maintained.  R: Pt is safe on the unit.  Pt is compliant with medications.  Pt verbally contracts for safety.  Will continue to monitor and assess.

## 2017-12-18 NOTE — [facility] Group Notes (Signed)
[facility] [employer] Group Therapy 12/18/2017 1:15pm  Type of Therapy: [employer] Presentation  Participation Level: Active  Participation Quality: Attentive  Affect: Appropriate  Cognitive: Oriented  Insight: Developing/Improving  Engagement in Therapy: Engaged  Modes of Intervention: Discussion, Education and Socialization  Summary of Progress/Problems: [employer] (MHA) Speaker came to talk about his personal journey with mental health. The pt processed ways by which to relate to the speaker. MHA speaker provided handouts and educational information pertaining to groups and services offered by the MHA. Pt was engaged in speaker's presentation and was receptive to resources provided.    Jan Olano N Smart, LCSW 12/18/2017 2:02 PM  

## 2017-12-18 NOTE — Progress Notes (Signed)
Recreation Therapy Notes  Date: 12/18/17 Time: 0930 Location: [address] Dayroom  Group Topic: Stress Management  Goal Area(s) Addresses:  Patient will verbalize importance of using healthy stress management.  Patient will identify positive emotions associated with healthy stress management.   Intervention: Stress Management  Activity : Meditation.  LRT played a body scan meditation from the Calm app. The meditation allowed patients to take inventory of any sensations they may or may not have been feeling.  Education:  Stress Management, Discharge Planning.   Education Outcome: Acknowledges edcuation/In group clarification offered/Needs additional education  Clinical Observations/Feedback: Pt did not attend group.     Yeudiel Mateo, LRT/CTRS         Merwyn Hodapp A 12/18/2017 12:04 PM 

## 2017-12-18 NOTE — Progress Notes (Signed)
  Adult Psychoeducational Group Note  Date:  12/18/2017 Time:  4:42 PM  Group Topic/Focus:  BINGO/Relaxation Activity   Participation Level:  Active  Participation Quality:  Appropriate  Affect:  Appropriate  Cognitive:  Appropriate  Insight: Appropriate  Engagement in Group:  Engaged  Modes of Intervention:  Activity  Additional Comments:    Criag Wicklund L 12/18/2017, 4:42 PM  

## 2017-12-18 NOTE — Plan of Care (Signed)
  Progressing Coping: Ability to verbalize feelings will improve 12/18/2017 1426 - Progressing by Lenord Fellersopson, Daily Doe Elizabeth, RN Medication: Compliance with prescribed medication regimen will improve 12/18/2017 1426 - Progressing by Lenord Fellersopson, Rochele Lueck Elizabeth, RN   Not Progressing Self-Concept: Ability to verbalize positive feelings about self will improve 12/18/2017 1426 - Not Progressing by Lenord Fellersopson, Alayia Meggison Elizabeth, RN   Patient is beginning to present as more assertive and verbalizes feelings better than yesterday. However, patient does not verbalize any positive feelings about himself.

## 2017-12-18 NOTE — Tx Team (Signed)
Interdisciplinary Treatment and Diagnostic Plan Update  12/18/2017 Time of Session: [id number] Aaron [name] Austin [name] Aaron [name] MRN: [id number]  Principal Diagnosis: MDD (major depressive disorder), recurrent, severe, with psychosis (HCC)  Secondary Diagnoses: Principal Problem:   MDD (major depressive disorder), recurrent, severe, with psychosis (HCC)   Current Medications:  Current Facility-Administered Medications  Medication Dose Route Frequency Provider Last Rate Last Dose  . acetaminophen (TYLENOL) tablet 650 mg  650 mg Oral Q6H PRN Rankin, Shuvon B, NP   650 mg at 12/15/17 1711  . alum & mag hydroxide-simeth (MAALOX/MYLANTA) 200-200-20 MG/5ML suspension 30 mL  30 mL Oral Q4H PRN Rankin, Shuvon B, NP      . chlordiazePOXIDE (LIBRIUM) capsule 25 mg  25 mg Oral Q6H PRN Nira ConnBerry, Jason A, NP      . chlordiazePOXIDE (LIBRIUM) capsule 25 mg  25 mg Oral BH-qamhs Jackelyn PolingBerry, Jason A, NP       Followed by  . [START ON 12/20/2017] chlordiazePOXIDE (LIBRIUM) capsule 25 mg  25 mg Oral Daily Nira ConnBerry, Jason A, NP      . escitalopram (LEXAPRO) tablet 10 mg  10 mg Oral Daily Money, Gerlene Burdockravis B, FNP   10 mg at 12/18/17 [id number]  . hydrOXYzine (ATARAX/VISTARIL) tablet 25 mg  25 mg Oral Q6H PRN Nira ConnBerry, Jason A, NP   25 mg at 12/16/17 2151  . loperamide (IMODIUM) capsule 2-4 mg  2-4 mg Oral PRN Nira ConnBerry, Jason A, NP      . magnesium hydroxide (MILK OF MAGNESIA) suspension 30 mL  30 mL Oral Daily PRN Rankin, Shuvon B, NP      . ondansetron (ZOFRAN-ODT) disintegrating tablet 4 mg  4 mg Oral Q6H PRN Nira ConnBerry, Jason A, NP   4 mg at 12/17/17 1216  . traZODone (DESYREL) tablet 50 mg  50 mg Oral QHS,MR X 1 Nira ConnBerry, Jason A, NP   50 mg at 12/17/17 2247   PTA Medications: No medications prior to admission.    Patient Stressors: Medication change or noncompliance Occupational concerns Substance abuse  Patient Strengths: Wellsite geologistCommunication skills General fund of knowledge Motivation for treatment/growth Physical Health Work skills  Treatment  Modalities: Medication Management, Group therapy, Case management,  1 to 1 session with clinician, Psychoeducation, Recreational therapy.   Physician Treatment Plan for Primary Diagnosis: MDD (major depressive disorder), recurrent, severe, with psychosis (HCC) Long Term Goal(s): Improvement in symptoms so as ready for discharge Improvement in symptoms so as ready for discharge   Short Term Goals: Ability to identify changes in lifestyle to reduce recurrence of condition will improve Ability to maintain clinical measurements within normal limits will improve Ability to identify triggers associated with substance abuse/mental health issues will improve  Medication Management: Evaluate patient's response, side effects, and tolerance of medication regimen.  Therapeutic Interventions: 1 to 1 sessions, Unit Group sessions and Medication administration.  Evaluation of Outcomes: Progressing  Physician Treatment Plan for Secondary Diagnosis: Principal Problem:   MDD (major depressive disorder), recurrent, severe, with psychosis (HCC)  Long Term Goal(s): Improvement in symptoms so as ready for discharge Improvement in symptoms so as ready for discharge   Short Term Goals: Ability to identify changes in lifestyle to reduce recurrence of condition will improve Ability to maintain clinical measurements within normal limits will improve Ability to identify triggers associated with substance abuse/mental health issues will improve     Medication Management: Evaluate patient's response, side effects, and tolerance of medication regimen.  Therapeutic Interventions: 1 to 1 sessions, Unit Group sessions and Medication administration.  Evaluation of  Outcomes: Progressing   RN Treatment Plan for Primary Diagnosis: MDD (major depressive disorder), recurrent, severe, with psychosis (HCC) Long Term Goal(s): Knowledge of disease and therapeutic regimen to maintain health will improve  Short Term Goals:  Ability to remain free from injury will improve, Ability to disclose and discuss suicidal ideas and Ability to identify and develop effective coping behaviors will improve  Medication Management: RN will administer medications as ordered by provider, will assess and evaluate patient's response and provide education to patient for prescribed medication. RN will report any adverse and/or side effects to prescribing provider.  Therapeutic Interventions: 1 on 1 counseling sessions, Psychoeducation, Medication administration, Evaluate responses to treatment, Monitor vital signs and CBGs as ordered, Perform/monitor CIWA, COWS, AIMS and Fall Risk screenings as ordered, Perform wound care treatments as ordered.  Evaluation of Outcomes: Progressing   LCSW Treatment Plan for Primary Diagnosis: MDD (major depressive disorder), recurrent, severe, with psychosis (HCC) Long Term Goal(s): Safe transition to appropriate next level of care at discharge, Engage patient in therapeutic group addressing interpersonal concerns.  Short Term Goals: Engage patient in aftercare planning with referrals and resources, Facilitate patient progression through stages of change regarding substance use diagnoses and concerns and Identify triggers associated with mental health/substance abuse issues  Therapeutic Interventions: Assess for all discharge needs, 1 to 1 time with Social worker, Explore available resources and support systems, Assess for adequacy in community support network, Educate family and significant other(s) on suicide prevention, Complete Psychosocial Assessment, Interpersonal group therapy.  Evaluation of Outcomes: Progressing   Progress in Treatment: Attending groups: Yes. Participating in groups: Yes. Taking medication as prescribed: Yes. Toleration medication: Yes. Family/Significant other contact made: SPE completed with pt; pt declined to consent to family contact.  Patient understands diagnosis:  Yes. Discussing patient identified problems/goals with staff: Yes. Medical problems stabilized or resolved: Yes. Denies suicidal/homicidal ideation: Yes. Issues/concerns per patient self-inventory: No. Other: n/a   New problem(s) identified: No, Describe:  n/a  New Short Term/Long Term Goal(s): detox, medication management for mood stabilization; elimination of SI thoughts; development of comprehensive mental wellness/sobriety plan.   Patient Goal: "to get into a rehab and then an [facility]."   Discharge Plan or Barriers: CSW assessing. Pt intersted in Rehab then going to [facility]. N current mental health providers.   Reason for Continuation of Hospitalization: Anxiety Depression Medication stabilization Suicidal ideation Withdrawal symptoms  Estimated Length of Stay: Wed, 12/20/17  Attendees: Patient: 12/18/2017 8:57 AM  Physician: Dr. Jama Flavors MD; Dr. Altamese McLain MD 12/18/2017 8:57 AM  Nursing: Foy Guadalajara RN; Patrice RN 12/18/2017 8:57 AM  RN Care Manager: Onnie Boer CM 12/18/2017 8:57 AM  Social Worker: Trula Slade, LCSW 12/18/2017 8:57 AM  Recreational Therapist: x 12/18/2017 8:57 AM  Other: Armandina Stammer NP; Hillery Jacks NP 12/18/2017 8:57 AM  Other:  12/18/2017 8:57 AM  Other: 12/18/2017 8:57 AM    Scribe for Treatment Team: Ledell Peoples Smart, LCSW 12/18/2017 8:57 AM

## 2017-12-18 NOTE — Progress Notes (Signed)
Patient ID: Aaron [name] Austin Zuch [name], male   DOB: 04/04/1976, 42 y.o.   MRN: [id number]  DAR: Pt. Denies HI and A/V Hallucinations. He continues to endorse SI. He reports, "I still feel the same as when I came in here." He reports that his sleep last night was fair, his appetite is fair, his energy level is low, and his concentration is poor. He rates his depression level 7/10, his hopelessness level 5/10, and his anxiety level 7/10. Patient does not report any pain or discomfort at this time. Support and encouragement provided to the patient. Scheduled medications administered to patient per physician's orders. Patient received an education handout on his medication Lexapro. Patient is minimal but cooperative with staff. Q15 minute checks are maintained for safety.

## 2017-12-18 NOTE — Progress Notes (Signed)
Per pt request, [facility] (ATTN: Almon RegisterBetsy Lewis).   Trula SladeHeather Smart, MSW, LCSW Clinical Social Worker 12/18/2017 2:43 PM

## 2017-12-19 NOTE — Progress Notes (Signed)
D: Pt was in the dayroom upon initial approach.  Pt presents with depressed affect and mood.  He describes his day as "pretty fair" and reports goal is to "stay positive, stay away from negative thoughts."  Pt denies SI/HI, denies hallucinations, reports pain from headache of 6/10.  Pt has been visible in milieu interacting with peers and staff appropriately.  Pt attended evening group.    A: Introduced self to pt.  Actively listened to pt and offered support and encouragement. Medication administered per order.  PRN medication administered for pain.  Q15 minute safety checks maintained.  R: Pt is safe on the unit.  Pt is compliant with medications.  Pt verbally contracts for safety.  Will continue to monitor and assess.

## 2017-12-19 NOTE — [facility] Group Notes (Signed)
LCSW Group Therapy Note   12/19/2017 1:15pm   Type of Therapy and Topic:  Group Therapy:  Overcoming Obstacles   Participation Level:  Active   Description of Group:    In this group patients will be encouraged to explore what they see as obstacles to their own wellness and recovery. They will be guided to discuss their thoughts, feelings, and behaviors related to these obstacles. The group will process together ways to cope with barriers, with attention given to specific choices patients can make. Each patient will be challenged to identify changes they are motivated to make in order to overcome their obstacles. This group will be process-oriented, with patients participating in exploration of their own experiences as well as giving and receiving support and challenge from other group members.   Therapeutic Goals: 1. Patient will identify personal and current obstacles as they relate to admission. 2. Patient will identify barriers that currently interfere with their wellness or overcoming obstacles.  3. Patient will identify feelings, thought process and behaviors related to these barriers. 4. Patient will identify two changes they are willing to make to overcome these obstacles:      Summary of Patient Progress   Aaron [name] was attentive and engaged during today's processing group. He shared that his biggest obstacle is "being anxious about [facility] to [facility] Aaron [name] was receptive to receiving a pamphlet/brochure about [facility] to learn more about programming. Aaron [name] is looking forward to having a phone interview today with the assessment team and plans to ask any other questions about the facility at that point. Aaron [name] continues to show progress in the group setting with improving insight.    Therapeutic Modalities:   Cognitive Behavioral Therapy Solution Focused Therapy Motivational Interviewing Relapse Prevention Therapy  Ledell PeoplesHeather N Smart, LCSW 12/19/2017 3:03  PM

## 2017-12-19 NOTE — [facility] Group Notes (Signed)
[facility] Group Notes:  (Nursing/MHT/Case Management/Adjunct)  Date:  12/19/2017  Time:  4:18 PM  Type of Therapy:  Psychoeducational Skills  Participation Level:  Active  Participation Quality:  Appropriate  Affect:  Appropriate  Cognitive:  Appropriate  Insight:  Appropriate  Engagement in Group:  Engaged  Modes of Intervention:  Activity and Education  Summary of Progress/Problems:  Nurse taught participants how to use deep breathing and short guided imagery sessions, on their own, specifically how these pertain to small time spans and stressful situations, in their every day lives.  Almira Barenny G Joseangel Nettleton 12/19/2017, 4:18 PM

## 2017-12-19 NOTE — Progress Notes (Signed)
Data. Patient denies HI/AVH. Endorses passive SI without a plan and is able to verbally contract for safety on the unit and to come to staff before acting of any self harm thoughts/feelings. Patient interacting well with staff and other patients. Affect has been flat but brightens with interaction. Patient has been in the day room and been active in the milieu all day. On his self assessment patient reports 7/10 for depression, 5/10 for hopelessness and 6/10 for anxiety. His goal for today is, "Staying positive."  Action. Emotional support and encouragement offered. Education provided on medication, indications and side effect. Q 15 minute checks done for safety. Response. Safety on the unit maintained through 15 minute checks.  Medications taken as prescribed. Attended groups. Remained calm and appropriate through out shift.

## 2017-12-19 NOTE — Progress Notes (Signed)
[facility]  12/19/2017 1:37 PM Aaron [name] Austin [name] [name]  MRN:  [id number]   Subjective:  Patient reports that he is doing better today and is hoping that [facility] good and he can go there. He reports intermittent SI with no plan and he feels it is mild feelings about SI. He denies any HI/AVH and contracts for safety. He reports his depression at 5/10 and his anxiety at 5/10. He reports sleeping well and having a good appetite.    Objective: Patient's chart and findings reviewed and discussed with the treatment team. Patient presents pleasant and cooperative. His affect is congruent. [facility] the paln is for him to discharge in 2 days. Patient will continue current medications.   Principal Problem: MDD (major depressive disorder), recurrent, severe, with psychosis (HCC) Diagnosis:   Patient Active Problem List   Diagnosis Date Noted  . MDD (major depressive disorder), recurrent, severe, with psychosis (HCC) [F33.3] 12/15/2017   Total Time spent with patient: 15 minutes  Past Psychiatric History: See H&P  Past Medical History:  Past Medical History:  Diagnosis Date  . Depression    History reviewed. No pertinent surgical history. Family History: History reviewed. No pertinent family history. Family Psychiatric  History: See H&P Social History:  Social History   Substance and Sexual Activity  Alcohol Use Yes   Comment: heavy     Social History   Substance and Sexual Activity  Drug Use No    Social History   Socioeconomic History  . Marital status: Single    Spouse name: None  . Number of children: None  . Years of education: None  . Highest education level: None  Social Needs  . Financial resource strain: None  . Food insecurity - worry: None  . Food insecurity - inability: None  . Transportation needs - medical: None  . Transportation needs - non-medical: None  Occupational History  . None   Tobacco Use  . Smoking status: Current Every Day Smoker  . Smokeless tobacco: Never Used  Substance and Sexual Activity  . Alcohol use: Yes    Comment: heavy  . Drug use: No  . Sexual activity: None  Other Topics Concern  . None  Social History Narrative  . None   Additional Social History:                         Sleep: Good  Appetite:  Good  Current Medications: Current Facility-Administered Medications  Medication Dose Route Frequency Provider Last Rate Last Dose  . acetaminophen (TYLENOL) tablet 650 mg  650 mg Oral Q6H PRN Rankin, Shuvon B, NP   650 mg at 12/19/17 0901  . alum & mag hydroxide-simeth (MAALOX/MYLANTA) 200-200-20 MG/5ML suspension 30 mL  30 mL Oral Q4H PRN Rankin, Shuvon B, NP      . [START ON 12/20/2017] chlordiazePOXIDE (LIBRIUM) capsule 25 mg  25 mg Oral Daily Nira ConnBerry, Jason A, NP      . escitalopram (LEXAPRO) tablet 10 mg  10 mg Oral Daily Rayen Dafoe, Gerlene Burdockravis B, FNP   10 mg at 12/19/17 0859  . magnesium hydroxide (MILK OF MAGNESIA) suspension 30 mL  30 mL Oral Daily PRN Rankin, Shuvon B, NP      . traZODone (DESYREL) tablet 100 mg  100 mg Oral QHS,MR X 1 Oneta RackLewis, Tanika N, NP   100 mg at 12/18/17 2128    Lab Results: No results  found for this or any previous visit (from the past 48 hour(s)).  Blood Alcohol level:  Lab Results  Component Value Date   ETH 88 (H) 12/14/2017    Metabolic Disorder Labs: No results found for: HGBA1C, MPG No results found for: PROLACTIN No results found for: CHOL, TRIG, HDL, CHOLHDL, VLDL, LDLCALC  Physical Findings: AIMS: Facial and Oral Movements Muscles of Facial Expression: None, normal Lips and Perioral Area: None, normal Jaw: None, normal Tongue: None, normal,Extremity Movements Upper (arms, wrists, hands, fingers): None, normal Lower (legs, knees, ankles, toes): None, normal, Trunk Movements Neck, shoulders, hips: None, normal, Overall Severity Severity of abnormal movements (highest score from questions  above): None, normal Incapacitation due to abnormal movements: None, normal Patient's awareness of abnormal movements (rate only patient's report): No Awareness, Dental Status Current problems with teeth and/or dentures?: No Does patient usually wear dentures?: No  CIWA:  CIWA-Ar Total: 2 COWS:     Musculoskeletal: Strength & Muscle Tone: within normal limits Gait & Station: normal Patient leans: N/A  Psychiatric Specialty Exam: Physical Exam  Nursing note and vitals reviewed. Constitutional: He is oriented to person, place, and time. He appears well-developed and well-nourished.  Cardiovascular: Normal rate.  Respiratory: Effort normal.  Musculoskeletal: Normal range of motion.  Neurological: He is alert and oriented to person, place, and time.  Skin: Skin is warm.    Review of Systems  Constitutional: Negative.   HENT: Negative.   Eyes: Negative.   Respiratory: Negative.   Cardiovascular: Negative.   Gastrointestinal: Negative.   Genitourinary: Negative.   Musculoskeletal: Negative.   Skin: Negative.   Neurological: Negative.   Endo/Heme/Allergies: Negative.   Psychiatric/Behavioral: Positive for depression and suicidal ideas. Negative for hallucinations. The patient is nervous/anxious.     Blood pressure 126/70, pulse 78, temperature 97.9 F (36.6 C), temperature source Oral, resp. rate 18, height 5' (1.524 m), weight 70.8 kg (156 lb).Body mass index is 30.47 kg/m.  General Appearance: Casual  Eye Contact:  Good  Speech:  Clear and Coherent and Normal Rate  Volume:  Normal  Mood:  Depressed  Affect:  Congruent  Thought Process:  Goal Directed and Descriptions of Associations: Intact  Orientation:  Full (Time, Place, and Person)  Thought Content:  WDL  Suicidal Thoughts:  No  Homicidal Thoughts:  No  Memory:  Immediate;   Good Recent;   Good Remote;   Good  Judgement:  Good  Insight:  Good  Psychomotor Activity:  Normal  Concentration:  Concentration: Good  and Attention Span: Good  Recall:  Good  Fund of Knowledge:  Good  Language:  Good  Akathisia:  No  Handed:  Right  AIMS (if indicated):     Assets:  Communication Skills Desire for Improvement Financial Resources/Insurance Housing Physical Health Social Support Transportation  ADL's:  Intact  Cognition:  WNL  Sleep:  Number of Hours: 6.75   Problems Addressed: MDD severe  Treatment Plan Summary: Daily contact with patient to assess and evaluate symptoms and progress in treatment, Medication management and Plan is to:  -Continue Librium Detox Protocol -Continue Lexapro 10 mg PO Daily for mood stability -Continue Trazodone 50 mg PO QHS PRN for insomnia -Encourage group therapy participation  Maryfrances Bunnell, FNP 12/19/2017, 1:37 PM

## 2017-12-19 NOTE — Plan of Care (Signed)
Patient has been appropriate on the unit through out the day this shift. He has been able to discuss his feelings in an appropriate manner, and his behavior has remained appropriate also.  Patient has been up, interacting with peers and going to groups. He is able to name his medications, their indications and schedules.

## 2017-12-19 NOTE — Progress Notes (Signed)
Adult Psychoeducational Group Note  Date:  12/19/2017 Time:  6:26 PM  Group Topic/Focus:  Relapse Prevention Planning:   The focus of this group is to define relapse and discuss the need for planning to combat relapse.  Participation Level:  Active  Participation Quality:  Appropriate  Affect:  Appropriate  Cognitive:  Appropriate  Insight: Appropriate  Engagement in Group:  Engaged  Modes of Intervention:  Discussion  Additional Comments:  Pt did attend group and actively participated in defining ways to prevent relapse.  Wynema BirchCagle, Yaslene Lindamood D 12/19/2017, 6:26 PM

## 2017-12-20 NOTE — [facility] Group Notes (Signed)
LCSW Group Therapy Note 12/20/2017 2:44 PM  Type of Therapy/Topic: Group Therapy: Emotion Regulation  Participation Level: Active   Description of Group:  The purpose of this group is to assist patients in learning to regulate negative emotions and experience positive emotions. Patients will be guided to discuss ways in which they have been vulnerable to their negative emotions. These vulnerabilities will be juxtaposed with experiences of positive emotions or situations, and patients will be challenged to use positive emotions to combat negative ones. Special emphasis will be placed on coping with negative emotions in conflict situations, and patients will process healthy conflict resolution skills.  Therapeutic Goals: 1. Patient will identify two positive emotions or experiences to reflect on in order to balance out negative emotions 2. Patient will label two or more emotions that they find the most difficult to experience 3. Patient will demonstrate positive conflict resolution skills through discussion and/or role plays  Summary of Patient Progress:  Aaron [name] was engaged throughout the entire group. He participated and contributed to the groups discussion regarding emotional regulation. Aaron [name] stated that once he discharges from the hospital, he plans to surround himself with positive people and stay away from any negative energy.    Therapeutic Modalities:  Cognitive Behavioral Therapy Feelings Identification Dialectical Behavioral Therapy   Aaron [name] LCSWA Clinical Social Worker

## 2017-12-20 NOTE — Progress Notes (Signed)
Patient attended N/A group meeting tonight. 

## 2017-12-20 NOTE — Progress Notes (Signed)
Pt attended orientation/goals group 

## 2017-12-20 NOTE — Progress Notes (Signed)
Pt has been accepted [facility] for Thursday per TownerJonathan in admissions. Transportation from the facility will pick patient up "after lunch." Pt made aware; Feliz Beamravis NP made aware. Christiane HaJonathan in admission will call unit with more concrete time.  Trula SladeHeather Smart, MSW, LCSW Clinical Social Worker 12/20/2017 11:29 AM

## 2017-12-20 NOTE — Progress Notes (Signed)
Recreation Therapy Notes  Animal-Assisted Activity (AAA) Program Checklist/Progress Notes Patient Eligibility Criteria Checklist & Daily Group note for Rec TxIntervention  Date: 1.29.19 Time: 2:45 pm  [facility] [address]   AAA/T Program Assumption of Risk Form signed by Patient/ or Parent Legal Guardian Yes  Patient is free of allergies or sever asthma Yes  Patient reports no fear of animals Yes  Patient reports no history of cruelty to animals Yes  Patient understands his/her participation is voluntary Yes  Patient washes hands before animal contact Yes  Patient washes hands after animal contact Yes  Behavioral Response: Did not attend   Sheryle Hailarian Graysyn Bache, Recreation Therapy Intern  Sheryle HailDarian Ching Rabideau 12/20/2017 9:26 AM

## 2017-12-20 NOTE — Progress Notes (Signed)
[facility] MD Progress Note  12/20/2017 12:35 PM Aaron [name]  MRN:  [id number]   Subjective:  Patient states that he is doing good today. He feels better each day. He feels he is ready to go to [employer]. He denies any SI/HI/AVH and contracts for safety. He reports having some mild anxiety about going to a new place.    Objective: Patient's chart and findings reviewed and discussed with the treatment team. Patient is in his bed sleeping. He has been up in the afternoons more and has attended groups. He has completed the detox protocol and will be discharging to the [facility] tomorrow.    Principal Problem: MDD (major depressive disorder), recurrent, severe, with psychosis (HCC) Diagnosis:   Patient Active Problem List   Diagnosis Date Noted  . MDD (major depressive disorder), recurrent, severe, with psychosis (HCC) [F33.3] 12/15/2017   Total Time spent with patient: 15 minutes  Past Psychiatric History: See H&P  Past Medical History:  Past Medical History:  Diagnosis Date  . Depression    History reviewed. No pertinent surgical history. Family History: History reviewed. No pertinent family history. Family Psychiatric  History: See H&P Social History:  Social History   Substance and Sexual Activity  Alcohol Use Yes   Comment: heavy     Social History   Substance and Sexual Activity  Drug Use No    Social History   Socioeconomic History  . Marital status: Single    Spouse name: None  . Number of children: None  . Years of education: None  . Highest education level: None  Social Needs  . Financial resource strain: None  . Food insecurity - worry: None  . Food insecurity - inability: None  . Transportation needs - medical: None  . Transportation needs - non-medical: None  Occupational History  . None  Tobacco Use  . Smoking status: Current Every Day Smoker  . Smokeless tobacco: Never Used  Substance and Sexual Activity  . Alcohol  use: Yes    Comment: heavy  . Drug use: No  . Sexual activity: None  Other Topics Concern  . None  Social History Narrative  . None   Additional Social History:                         Sleep: Good  Appetite:  Good  Current Medications: Current Facility-Administered Medications  Medication Dose Route Frequency Provider Last Rate Last Dose  . acetaminophen (TYLENOL) tablet 650 mg  650 mg Oral Q6H PRN Rankin, Shuvon B, NP   650 mg at 12/19/17 2115  . alum & mag hydroxide-simeth (MAALOX/MYLANTA) 200-200-20 MG/5ML suspension 30 mL  30 mL Oral Q4H PRN Rankin, Shuvon B, NP      . escitalopram (LEXAPRO) tablet 10 mg  10 mg Oral Daily Venancio Chenier, Gerlene Burdock, FNP   10 mg at 12/20/17 0804  . magnesium hydroxide (MILK OF MAGNESIA) suspension 30 mL  30 mL Oral Daily PRN Rankin, Shuvon B, NP      . traZODone (DESYREL) tablet 100 mg  100 mg Oral QHS,MR X 1 Oneta Rack, NP   100 mg at 12/19/17 2115    Lab Results: No results found for this or any previous visit (from the past 48 hour(s)).  Blood Alcohol level:  Lab Results  Component Value Date   ETH 88 (H) 12/14/2017    Metabolic Disorder Labs: No results found for: HGBA1C, MPG No results found  for: PROLACTIN No results found for: CHOL, TRIG, HDL, CHOLHDL, VLDL, LDLCALC  Physical Findings: AIMS: Facial and Oral Movements Muscles of Facial Expression: None, normal Lips and Perioral Area: None, normal Jaw: None, normal Tongue: None, normal,Extremity Movements Upper (arms, wrists, hands, fingers): None, normal Lower (legs, knees, ankles, toes): None, normal, Trunk Movements Neck, shoulders, hips: None, normal, Overall Severity Severity of abnormal movements (highest score from questions above): None, normal Incapacitation due to abnormal movements: None, normal Patient's awareness of abnormal movements (rate only patient's report): No Awareness, Dental Status Current problems with teeth and/or dentures?: No Does patient  usually wear dentures?: No  CIWA:  CIWA-Ar Total: 4 COWS:     Musculoskeletal: Strength & Muscle Tone: within normal limits Gait & Station: normal Patient leans: N/A  Psychiatric Specialty Exam: Physical Exam  Nursing note and vitals reviewed. Constitutional: He is oriented to person, place, and time. He appears well-developed and well-nourished.  Cardiovascular: Normal rate.  Respiratory: Effort normal.  Musculoskeletal: Normal range of motion.  Neurological: He is alert and oriented to person, place, and time.  Skin: Skin is warm.    Review of Systems  Constitutional: Negative.   HENT: Negative.   Eyes: Negative.   Respiratory: Negative.   Cardiovascular: Negative.   Gastrointestinal: Negative.   Genitourinary: Negative.   Musculoskeletal: Negative.   Skin: Negative.   Neurological: Negative.   Endo/Heme/Allergies: Negative.   Psychiatric/Behavioral: Negative for depression, hallucinations and suicidal ideas. The patient is nervous/anxious.     Blood pressure 115/71, pulse 80, temperature (!) 97.3 F (36.3 C), temperature source Oral, resp. rate 16, height 5' (1.524 m), weight 70.8 kg (156 lb), SpO2 98 %.Body mass index is 30.47 kg/m.  General Appearance: Casual  Eye Contact:  Good  Speech:  Clear and Coherent and Normal Rate  Volume:  Normal  Mood:  Euthymic  Affect:  Congruent  Thought Process:  Goal Directed and Descriptions of Associations: Intact  Orientation:  Full (Time, Place, and Person)  Thought Content:  WDL  Suicidal Thoughts:  No  Homicidal Thoughts:  No  Memory:  Immediate;   Good Recent;   Good Remote;   Good  Judgement:  Good  Insight:  Good  Psychomotor Activity:  Normal  Concentration:  Concentration: Good and Attention Span: Good  Recall:  Good  Fund of Knowledge:  Good  Language:  Good  Akathisia:  No  Handed:  Right  AIMS (if indicated):     Assets:  Communication Skills Desire for Improvement Financial  Resources/Insurance Housing Physical Health Social Support Transportation  ADL's:  Intact  Cognition:  WNL  Sleep:  Number of Hours: 6.75   Problems Addressed: MDD severe  Treatment Plan Summary: Daily contact with patient to assess and evaluate symptoms and progress in treatment, Medication management and Plan is to:  -Continue Librium Detox Protocol -Continue Lexapro 10 mg PO Daily for mood stability -Continue Trazodone 50 mg PO QHS PRN for insomnia -Encourage group therapy participation -[facility]  Maryfrances Bunnellravis B Samuell Knoble, FNP 12/20/2017, 12:35 PM

## 2017-12-20 NOTE — Progress Notes (Signed)
Recreation Therapy Notes  Date: 12/20/17 Time: 0930 Location: 300 [address] Dayroom  Group Topic: Stress Management  Goal Area(s) Addresses:  Patient will verbalize importance of using healthy stress management.  Patient will identify positive emotions associated with healthy stress management.   Behavioral Response: Engaged  Intervention: Stress Management  Activity :  Guided Imagery.  LRT introduced the stress management technique of guided imagery.  LRT read a script that allowed patients to take a stroll on the beach.  Patients were to follow along as LRT read script to engage in activity.  Education:  Stress Management, Discharge Planning.   Education Outcome: Acknowledges edcuation/In group clarification offered/Needs additional education  Clinical Observations/Feedback: Pt attended group.    Tashika Goodin, LRT/CTRS         Kendal Ghazarian A 12/20/2017 11:32 AM 

## 2017-12-20 NOTE — Progress Notes (Signed)
D: Pt was in the dayroom upon initial approach.  Pt presents with appropriate affect and mood.  He reports his day was "good" and his goal is to "stay in the same positive mood."  Pt denies SI/HI, denies hallucinations, denies pain.  Pt has been visible in milieu interacting with peers and staff appropriately.  Pt attended evening group.    A: Introduced self to pt.  Actively listened to pt and offered support and encouragement. Medication administered per order.  Q15 minute safety checks maintained.  R: Pt is safe on the unit.  Pt is compliant with medication.  Pt verbally contracts for safety.  Will continue to monitor and assess.

## 2017-12-20 NOTE — [facility] Group Notes (Signed)
[facility] Group Notes:  (Nursing/MHT/Case Management/Adjunct)  Date:  12/20/2017  Time:  2:14 PM  Type of Therapy:  Psychoeducational Skills  Participation Level:  Active  Participation Quality:  Attentive  Affect:  Anxious  Cognitive:  Alert  Insight:  Appropriate  Engagement in Group:  Engaged  Modes of Intervention:  Discussion and Education  Summary of Progress/Problems:  Pt participated in group. During this group patients discussed values. Pt's were asked to state which values are important to them and which values they would like to work on improving. Pt's completed two worksheets from their daily packets.   Karren CobbleFizah G Mckinze Poirier 12/20/2017, 2:14 PM

## 2017-12-21 NOTE — [facility] Group Notes (Signed)
LCSW Group Therapy Note  12/21/2017 1:15pm  Type of Therapy/Topic:  Group Therapy:  Feelings about Diagnosis  Participation Level:  Did Not Attend--pt on phone with insurance provider-excused from group.    Description of Group:   This group will allow patients to explore their thoughts and feelings about diagnoses they have received. Patients will be guided to explore their level of understanding and acceptance of these diagnoses. Facilitator will encourage patients to process their thoughts and feelings about the reactions of others to their diagnosis and will guide patients in identifying ways to discuss their diagnosis with significant others in their lives. This group will be process-oriented, with patients participating in exploration of their own experiences, giving and receiving support, and processing challenge from other group members.   Therapeutic Goals: 1. Patient will demonstrate understanding of diagnosis as evidenced by identifying two or more symptoms of the disorder 2. Patient will be able to express two feelings regarding the diagnosis 3. Patient will demonstrate their ability to communicate their needs through discussion and/or role play  Summary of Patient Progress:   x    Therapeutic Modalities:   Cognitive Behavioral Therapy Brief Therapy Feelings Identification    Ledell PeoplesHeather N Smart, LCSW 12/21/2017 11:42 AM

## 2017-12-21 NOTE — Progress Notes (Signed)
[facility]  12/21/2017 2:05 PM Aaron [name] Austin Merwin [name]  MRN:  [id number]   Subjective:  Patient reports that he is doing good today. [facility]. He states that they report that they will work with him on payment. He is hoping to get to go there tomorrow. He denies any SI/HI/AVH and contracts for safety. He denies any depression, only some anxiety over getting to treatment   Objective: Patient's chart and findings reviewed and discussed with the treatment team. Patient presents in the milieu interacting appropriately with peers and staff. He will continue current medications. Patient is planned for discharge tomorrow whether [facility] accepts him or not.   Principal Problem: MDD (major depressive disorder), recurrent, severe, with psychosis (HCC) Diagnosis:   Patient Active Problem List   Diagnosis Date Noted  . MDD (major depressive disorder), recurrent, severe, with psychosis (HCC) [F33.3] 12/15/2017   Total Time spent with patient: 15 minutes  Past Psychiatric History: See H&P  Past Medical History:  Past Medical History:  Diagnosis Date  . Depression    History reviewed. No pertinent surgical history. Family History: History reviewed. No pertinent family history. Family Psychiatric  History: See H&P Social History:  Social History   Substance and Sexual Activity  Alcohol Use Yes   Comment: heavy     Social History   Substance and Sexual Activity  Drug Use No    Social History   Socioeconomic History  . Marital status: Single    Spouse name: None  . Number of children: None  . Years of education: None  . Highest education level: None  Social Needs  . Financial resource strain: None  . Food insecurity - worry: None  . Food insecurity - inability: None  . Transportation needs - medical: None  . Transportation needs - non-medical: None  Occupational History  . None  Tobacco Use  . Smoking status:  Current Every Day Smoker  . Smokeless tobacco: Never Used  Substance and Sexual Activity  . Alcohol use: Yes    Comment: heavy  . Drug use: No  . Sexual activity: None  Other Topics Concern  . None  Social History Narrative  . None   Additional Social History:                         Sleep: Good  Appetite:  Good  Current Medications: Current Facility-Administered Medications  Medication Dose Route Frequency Provider Last Rate Last Dose  . acetaminophen (TYLENOL) tablet 650 mg  650 mg Oral Q6H PRN Rankin, Shuvon B, NP   650 mg at 12/19/17 2115  . alum & mag hydroxide-simeth (MAALOX/MYLANTA) 200-200-20 MG/5ML suspension 30 mL  30 mL Oral Q4H PRN Rankin, Shuvon B, NP      . escitalopram (LEXAPRO) tablet 10 mg  10 mg Oral Daily Money, Gerlene Burdockravis B, FNP   10 mg at 12/21/17 0801  . magnesium hydroxide (MILK OF MAGNESIA) suspension 30 mL  30 mL Oral Daily PRN Rankin, Shuvon B, NP      . traZODone (DESYREL) tablet 100 mg  100 mg Oral QHS,MR X 1 Oneta RackLewis, Tanika N, NP   100 mg at 12/20/17 2101    Lab Results: No results found for this or any previous visit (from the past 48 hour(s)).  Blood Alcohol level:  Lab Results  Component Value Date   ETH 88 (H) 12/14/2017    Metabolic Disorder Labs: No  results found for: HGBA1C, MPG No results found for: PROLACTIN No results found for: CHOL, TRIG, HDL, CHOLHDL, VLDL, LDLCALC  Physical Findings: AIMS: Facial and Oral Movements Muscles of Facial Expression: None, normal Lips and Perioral Area: None, normal Jaw: None, normal Tongue: None, normal,Extremity Movements Upper (arms, wrists, hands, fingers): None, normal Lower (legs, knees, ankles, toes): None, normal, Trunk Movements Neck, shoulders, hips: None, normal, Overall Severity Severity of abnormal movements (highest score from questions above): None, normal Incapacitation due to abnormal movements: None, normal Patient's awareness of abnormal movements (rate only patient's  report): No Awareness, Dental Status Current problems with teeth and/or dentures?: No Does patient usually wear dentures?: No  CIWA:  CIWA-Ar Total: 4 COWS:     Musculoskeletal: Strength & Muscle Tone: within normal limits Gait & Station: normal Patient leans: N/A  Psychiatric Specialty Exam: Physical Exam  Nursing note and vitals reviewed. Constitutional: He is oriented to person, place, and time. He appears well-developed and well-nourished.  Cardiovascular: Normal rate.  Respiratory: Effort normal.  Musculoskeletal: Normal range of motion.  Neurological: He is alert and oriented to person, place, and time.  Skin: Skin is warm.    Review of Systems  Constitutional: Negative.   HENT: Negative.   Eyes: Negative.   Respiratory: Negative.   Cardiovascular: Negative.   Gastrointestinal: Negative.   Genitourinary: Negative.   Musculoskeletal: Negative.   Skin: Negative.   Neurological: Negative.   Endo/Heme/Allergies: Negative.   Psychiatric/Behavioral: Negative for depression, hallucinations and suicidal ideas. The patient is nervous/anxious.     Blood pressure 107/68, pulse 93, temperature (!) 97.5 F (36.4 C), temperature source Oral, resp. rate 16, height 5' (1.524 m), weight 70.8 kg (156 lb), SpO2 98 %.Body mass index is 30.47 kg/m.  General Appearance: Casual  Eye Contact:  Good  Speech:  Clear and Coherent and Normal Rate  Volume:  Normal  Mood:  Euthymic  Affect:  Congruent  Thought Process:  Goal Directed and Descriptions of Associations: Intact  Orientation:  Full (Time, Place, and Person)  Thought Content:  WDL  Suicidal Thoughts:  No  Homicidal Thoughts:  No  Memory:  Immediate;   Good Recent;   Good Remote;   Good  Judgement:  Good  Insight:  Good  Psychomotor Activity:  Normal  Concentration:  Concentration: Good and Attention Span: Good  Recall:  Good  Fund of Knowledge:  Good  Language:  Good  Akathisia:  No  Handed:  Right  AIMS (if  indicated):     Assets:  Communication Skills Desire for Improvement Financial Resources/Insurance Housing Physical Health Social Support Transportation  ADL's:  Intact  Cognition:  WNL  Sleep:  Number of Hours: 6.75   Problems Addressed: MDD severe  Treatment Plan Summary: Daily contact with patient to assess and evaluate symptoms and progress in treatment, Medication management and Plan is to:  -Continue Librium Detox Protocol -Continue Lexapro 10 mg PO Daily for mood stability -Continue Trazodone 50 mg PO QHS PRN for insomnia -Encourage group therapy participation -Discharge Tomorrow  Maryfrances Bunnell, FNP 12/21/2017, 2:05 PM

## 2017-12-21 NOTE — Progress Notes (Signed)
CSW [place] in [place], stating that pt's insurance expires today, making him ineligible for treatment. They are requesting his deductable amount to find out if they can help him cover a portion of this and possibly get him into treatment. Onnie BoerJennifer Clark CM notified and is waiting on call back from pt's insurance provider. [facility]he plans to call tonight for phone interviews[facility] or [facility]. "I've been there before and know the drill." If pt would go to [facility], he would follow-up at [facility] for outpatient mental health services [place]. CSW continuing to assess.   Trula SladeHeather Smart, MSW, LCSW Clinical Social Worker 12/21/2017 10:45 AM

## 2017-12-21 NOTE — Progress Notes (Signed)
Patient ID: Aaron [name] [name], male   DOB: 02/23/1976, 42 y.o.   MRN: [id number]  Pt currently presents with a flat affect and cooperative behavior. Pt eyes were partially open during conversations tonight, seems drowsy currently. Mood is pleasant. Pt attends group, interacts with peers. Pt states denies any current concerns. Pt reports good sleep with current medication regimen.   Pt provided with medications per providers orders. Pt's labs and vitals were monitored throughout the night. Pt given a 1:1 about emotional and mental status. Pt supported and encouraged to express concerns and questions. Pt educated on medications.  Pt's safety ensured with 15 minute and environmental checks. Pt currently denies SI/HI and A/V hallucinations. Pt verbally agrees to seek staff if SI/HI or A/VH occurs and to consult with staff before acting on any harmful thoughts. Will continue POC.

## 2017-12-21 NOTE — Progress Notes (Signed)
Adult Psychoeducational Group Note  Date:  12/21/2017 Time:  4:00 PM  Group Topic/Focus:  Relaxation Group  Participation Level:  Active  Participation Quality:  Appropriate, Attentive and Supportive  Affect:  Appropriate  Cognitive: Alert; Oriented.  Insight: Improving  Engagement in Group: Engaged  Modes of Intervention:  Discussion and Support  Additional Comments: Patient participated in the exercise and enjoyed it.   Aaron [name], Aaron [name] 12/21/2017, 5:49 PM

## 2017-12-21 NOTE — Progress Notes (Signed)
Pt attended morning Alcoholics Anonymous group. 

## 2017-12-21 NOTE — Progress Notes (Signed)
D: Patient has been on the phone with his insurance company today.  He states that his coverage lapsed and he was on [employer]ra.  He states, "they don't when that will kick in to cover my treatment."  Social worker has been working with him to see if he can possibly get into treatment if they will cover a portion of his treatment.  Patient plans to [facility].  He denies any thoughts of self harm.  Patient has been continuing to improve with his treatment.  He is brighter and has handled the interruption of his insurance well.    A: Continue to monitor medication management and MD orders.  Safety checks continued every 15 minutes per protocol.  Offer support and encouragement as needed.  R: Patient is receptive to staff; his behavior is appropriate.

## 2017-12-21 NOTE — Plan of Care (Signed)
  Completed/Met Activity: Interest or engagement in activities will improve 12/21/2017 1553 - Completed/Met by Joice Lofts, RN Sleeping patterns will improve 12/21/2017 1553 - Completed/Met by Joice Lofts, RN Education: Knowledge of [place] Education information/materials will improve 12/21/2017 1553 - Completed/Met by Joice Lofts, RN Emotional status will improve 12/21/2017 1553 - Completed/Met by Joice Lofts, RN Mental status will improve 12/21/2017 1553 - Completed/Met by Joice Lofts, RN Verbalization of understanding the information provided will improve 12/21/2017 1553 - Completed/Met by Joice Lofts, RN Coping: Ability to verbalize frustrations and anger appropriately will improve 12/21/2017 1553 - Completed/Met by Joice Lofts, RN Ability to demonstrate self-control will improve 12/21/2017 1553 - Completed/Met by Joice Lofts, RN Health Behavior/Discharge Planning: Identification of resources available to assist in meeting health care needs will improve 12/21/2017 1553 - Completed/Met by Joice Lofts, RN Compliance with treatment plan for underlying cause of condition will improve 12/21/2017 1553 - Completed/Met by Joice Lofts, RN Physical Regulation: Ability to maintain clinical measurements within normal limits will improve 12/21/2017 1553 - Completed/Met by Joice Lofts, RN Safety: Periods of time without injury will increase 12/21/2017 1553 - Completed/Met by Joice Lofts, RN Activity: Interest or engagement in leisure activities will improve 12/21/2017 1553 - Completed/Met by Joice Lofts, RN Imbalance in normal sleep/wake cycle will improve 12/21/2017 1553 - Completed/Met by Joice Lofts, RN Education: Utilization of techniques to improve thought processes will improve 12/21/2017 1553 - Completed/Met by Joice Lofts, RN Knowledge of the prescribed therapeutic  regimen will improve 12/21/2017 1553 - Completed/Met by Joice Lofts, RN Coping: Ability to cope will improve 12/21/2017 1553 - Completed/Met by Joice Lofts, RN Ability to verbalize feelings will improve 12/21/2017 1553 - Completed/Met by [place], [place] Behavior/Discharge Planning: Ability to make decisions will improve 12/21/2017 1553 - Completed/Met by Joice Lofts, RN Compliance with therapeutic regimen will improve 12/21/2017 1553 - Completed/Met by Joice Lofts, RN Education: Ability to make informed decisions regarding treatment will improve 12/21/2017 1553 - Completed/Met by Joice Lofts, RN Coping: Ability to cope will improve 12/21/2017 1553 - Completed/Met by Joice Lofts, [place] Behavior/Discharge Planning: Identification of resources available to assist in meeting health care needs will improve 12/21/2017 1553 - Completed/Met by Joice Lofts, RN Medication: Compliance with prescribed medication regimen will improve 12/21/2017 1553 - Completed/Met by Joice Lofts, RN Self-Concept: Ability to disclose and discuss suicidal ideas will improve 12/21/2017 1553 - Completed/Met by Joice Lofts, RN Ability to verbalize positive feelings about self will improve 12/21/2017 1553 - Completed/Met by Joice Lofts, [place] Behavior/Discharge Planning: Ability to identify changes in lifestyle to reduce recurrence of condition will improve 12/21/2017 1553 - Completed/Met by Joice Lofts, RN Identification of resources available to assist in meeting health care needs will improve 12/21/2017 1553 - Completed/Met by Joice Lofts, RN Physical Regulation: Complications related to the disease process, condition or treatment will be avoided or minimized 12/21/2017 1553 - Completed/Met by Joice Lofts, RN Spiritual Needs Ability to function at adequate level 12/21/2017 1553 - Completed/Met by  Joice Lofts, RN

## 2017-12-21 NOTE — [facility] Group Notes (Signed)
Adult Psychoeducational Group Note  Date:  12/21/2017 Time:  10:42 AM  Group Topic/Focus:  Goals Group:   The focus of this group is to help patients establish daily goals to achieve during treatment and discuss how the patient can incorporate goal setting into their daily lives to aide in recovery.  Participation Level:  Active  Participation Quality:  Appropriate  Affect:  Appropriate  Cognitive:  Alert  Insight: Appropriate  Engagement in Group:  Engaged  Modes of Intervention:  Orientation  Additional Comments:  Pt was engaged and alert in orientation/goals group. Pt goal for today is to stay positive and focus.  Dellia NimsJaquesha M Jarod Bozzo 12/21/2017, 10:42 AM

## 2017-12-21 NOTE — Progress Notes (Signed)
[facility] Group Notes:  (Nursing/MHT/Case Management/Adjunct)  Date:  12/21/2017  Time:  2045  Type of Therapy:  wrap up group  Participation Level:  Active  Participation Quality:  Appropriate, Attentive, Sharing and Supportive  Affect:  Flat  Cognitive:  Appropriate  Insight:  Improving  Engagement in Group:  Engaged  Modes of Intervention:  Clarification, Education and Support  Summary of Progress/Problems:  Marcille BuffyMcNeil, Margy Sumler S 12/21/2017, 9:29 PM

## 2017-12-22 MED ORDER — TRAZODONE HCL 100 MG PO TABS: 100.0000 mg | ORAL_TABLET | Freq: Every evening | ORAL | 0 refills | Status: DC | PRN

## 2017-12-22 MED ORDER — ESCITALOPRAM OXALATE 10 MG PO TABS: 10.0000 mg | ORAL_TABLET | Freq: Every day | ORAL | 0 refills | Status: DC

## 2017-12-22 MED ORDER — TRAZODONE HCL 100 MG PO TABS
100.0000 mg | ORAL_TABLET | Freq: Every evening | ORAL | Status: DC | PRN
  Filled 2017-12-22: qty 7

## 2017-12-22 NOTE — [facility] Suicide Risk Assessment (Signed)
[facility] Discharge Suicide Risk Assessment   Principal Problem: MDD (major depressive disorder), recurrent, severe, with psychosis (HCC) Discharge Diagnoses:  Patient Active Problem List   Diagnosis Date Noted  . MDD (major depressive disorder), recurrent, severe, with psychosis (HCC) [F33.3] 12/15/2017    Total Time spent with patient: 45 minutes  Musculoskeletal: Strength & Muscle Tone: within normal limits Gait & Station: normal Patient leans: N/A  Psychiatric Specialty Exam: Review of Systems  Constitutional: Negative.   HENT: Negative.   Eyes: Negative.   Respiratory: Negative.   Cardiovascular: Negative.   Gastrointestinal: Negative.   Genitourinary: Negative.   Musculoskeletal: Negative.   Skin: Negative.   Neurological: Negative.   Endo/Heme/Allergies: Negative.   Psychiatric/Behavioral: Negative for depression, hallucinations, memory loss, substance abuse and suicidal ideas. The patient is not nervous/anxious and does not have insomnia.     Blood pressure 124/76, pulse 78, temperature (!) 97.5 F (36.4 C), temperature source Oral, resp. rate 16, height 5' (1.524 m), weight 70.8 kg (156 lb), SpO2 98 %.Body mass index is 30.47 kg/m.  General Appearance: Neatly dressed, pleasant, engaging well and cooperative. Appropriate behavior. Not in any distress. Good relatedness. Not internally stimulated  Eye Contact::  Good  Speech:  Spontaneous, normal prosody. Normal tone and rate.   Volume:  Normal  Mood:  Euthymic  Affect:  Appropriate and Full Range  Thought Process:  Linear  Orientation:  Full (Time, Place, and Person)  Thought Content:  Future oriented. No delusional theme. No preoccupation with violent thoughts. No negative ruminations. No obsession.  No hallucination in any modality.   Suicidal Thoughts:  No  Homicidal Thoughts:  No  Memory:  Immediate;   Good Recent;   Good Remote;   Good  Judgement:  Good  Insight:  Good  Psychomotor Activity:  Normal   Concentration:  Good  Recall:  Good  Fund of Knowledge:Good  Language: Good  Akathisia:  Negative  Handed:    AIMS (if indicated):     Assets:  Communication Skills Desire for Improvement Physical Health Resilience  Sleep:  Number of Hours: 6.25  Cognition: WNL  ADL's:  Intact   Clinical Assessment::    42 y.o AAM single, employed, homeless. Background history of SUD and depression. Presented to the ER intoxicated with alcohol. Was hearing voices and felt people are out to harm him. Patient expressed thoughts of harming others and thoughts of shooting himself in the head. Routine labs are within normal limits. BAL88 mg/dl.  Chart reviewed today. Patient discussed at team today. Patient has been doing well. He has been motivated to get into rehab. Insurance coverage has been a hindrance.   Nursing staff reports that patient has been appropriate on the unit. Patient has been interacting well with peers. No behavioral issues. Patient has not voiced any suicidal thoughts. Patient has not been observed to be internally stimulated. Patient has been adherent with treatment recommendations. Patient has been tolerating their medication well.   Seen today. Patient tells me that he was drunk when he came to the hospital. Says his actions was due to effects of alcohol. Says he has fully come off the effects of alcohol. He now feels good again. Says he has no intentions of harming himself or anyone else. Wished he could have gone right into rehab. Says he plans to do outpatient until his insurance kicks in again. [facility]. Reports that he is in good spirits. Not feeling depressed. Reports normal energy  and interest. Has been maintaining normal biological functions. He is able to think clearly. He is able to focus on task. His thoughts are not crowded or racing. No evidence of mania. No hallucination in any modality. He is not making any delusional statement. No  passivity of will/thought. He is fully in touch with reality. No thoughts of suicide. No thoughts of homicide. No violent thoughts. No overwhelming anxiety. No access to weapons. No new stressors. No craving for substances.    Demographic Factors:  Male and Low socioeconomic status  Loss Factors: NA  Historical Factors: Impulsivity  Risk Reduction Factors:   Sense of responsibility to family, Employed, Living with another person, especially a relative, Positive social support, Positive therapeutic relationship, Positive coping skills or problem solving skills and has a grown daughter.  Continued Clinical Symptoms:  As above.   Cognitive Features That Contribute To Risk:  None    Suicide Risk:  Minimal: No identifiable suicidal ideation.  Patient is not having any thoughts of suicide at this time. Modifiable risk factors targeted during this admission includes substance use and related psychosis.  Demographical and historical risk factors cannot be modified. Patient is now engaging well. Patient is reliable and is future oriented. We have buffered patient's support structures. At this point, patient is at low risk of suicide. Patient is aware of the effects of psychoactive substances on decision making process. Patient has been provided with emergency contacts. Patient acknowledges to use resources provided if unforseen circumstances changes their current risk stratification.    Follow-up Information    [facility] Follow up on 12/21/2017.   Contact information: [address] [place] [address] [phone]        [facility] Follow up.   Why:  Please walk in within 3 days of hospital discharge to be assessed for outpatient mental health services including medication management/counseling. Please arrive by 8:30AM Monday-Friday. Thank you.  Contact information: [address] [place], [place] [address] Phone: [phone] Fax: [phone]           Plan Of Care/Follow-up recommendations:  1. Continue current psychotropic medications 2. Mental health and addiction follow up as arranged.  3. Provided limited quantity of prescriptions   Georgiann CockerVincent A Izediuno, MD 12/22/2017, 8:41 AM

## 2017-12-22 NOTE — Progress Notes (Signed)
Discharge note:  Patient discharged home per MD order.  Reviewed AVS/transition record with patient and he indicated understanding.  Patient received all personal belongings from locker and unit.  [facility] and he will buy his own ticket.  He denies any thoughts of self harm.  He is hoping to get into a treatment center later in the month.  Patient left ambulatory with a bus ticket.

## 2017-12-22 NOTE — Progress Notes (Signed)
Recreation Therapy Notes  Date:  12/22/17 Time: 0930 Location: 300 Hall Dayroom  Group Topic: Stress Management  Goal Area(s) Addresses:  Patient will verbalize importance of using healthy stress management.  Patient will identify positive emotions associated with healthy stress management.   Behavioral Response: Engaged  Intervention: Stress Managemnt  Activity :  Meditation.  LRT introduced the stress management technique of meditation.  LRT played a meditation on the stillness and resilience of mountains and how it can be used in mindfulness to ground and center you during the meditation.  Education:  Stress Management, Discharge Planning.   Education Outcome: Acknowledges edcuation/In group clarification offered/Needs additional education  Clinical Observations/Feedback: Pt attended group.     Caroll RancherMarjette Laketia Vicknair, LRT/CTRS        Lillia AbedLindsay, Azarian Starace A 12/22/2017 11:16 AM

## 2017-12-22 NOTE — Progress Notes (Addendum)
  [facility] :  Will you be returning to the same living situation after discharge:  No. [facility].  At discharge, do you have transportation home?: Yes,  bus pass provided. Pt states that he has money for train ticket. Train schedule provided to pt. PT MUST DISCHARGE BY 10AM ON Friday, 12/22/17 Do you have the ability to pay for your medications: Yes,  BCBS-lapsed during his stay here. Currently mental health  Release of information consent forms completed and submitted to medical records by CSW.  Patient to Follow up at: Follow-up Information    [facility] Follow up.   Why:  Please walk in within 3 days of hospital discharge to be assessed for outpatient mental health services including medication management/counseling. Please arrive by 8:30AM Monday-Friday. Thank you.  Contact information: [address] , [place] [address] Phone: [phone] Fax: [phone]          Next level of care provider has access to [facility] Link:no  Safety Planning and Suicide Prevention discussed: Yes,  SPE completed with pt; pt declined to consent to family contact. SPI pamphlet and Mobile Crisis information provided to pt.   Have you used any form of tobacco in the last 30 days? (Cigarettes, Smokeless Tobacco, Cigars, and/or Pipes): Yes  Has patient been referred to the Quitline?: Patient refused referral  Patient has been referred for addiction treatment: Yes  Pulte HomesHeather N Smart, LCSW 12/22/2017, 8:43 AM

## 2017-12-22 NOTE — Discharge Summary (Signed)
Physician Discharge Summary Note  Patient:  Aaron [name] is an 42 y.o., male MRN:  [id number] DOB:  [birth year] Patient phone:  [phone] (home)  Patient address:   [address] [place] [address],  Total Time spent with patient: 20 minutes  Date of Admission:  12/15/2017 Date of Discharge: 12/22/17   Reason for Admission:  Worsening depression with SI  Principal Problem: MDD (major depressive disorder), recurrent, severe, with psychosis [facility]) Discharge Diagnoses: Patient Active Problem List   Diagnosis Date Noted  . MDD (major depressive disorder), recurrent, severe, with psychosis (HCC) [F33.3] 12/15/2017    Past Psychiatric History: reports prior psychiatric admission about 8 months ago, for depression and alcohol abuse . Denies history of suicide attempts, reports history of vague intermittent auditory hallucinations, as above . Denies history of mania or hypomania , denies agoraphobia. Reports history of occasional panic attacks. Denies history of violence  Past Medical History:  Past Medical History:  Diagnosis Date  . Depression    History reviewed. No pertinent surgical history. Family History: History reviewed. No pertinent family history. Family Psychiatric  History: denies history of psychiatric illness in family, no suicidal attempts in family, brother had history of substance abuse . States he has uncles and cousins who drink heavily  Social History:  Social History   Substance and Sexual Activity  Alcohol Use Yes   Comment: heavy     Social History   Substance and Sexual Activity  Drug Use No    Social History   Socioeconomic History  . Marital status: Single    Spouse name: None  . Number of children: None  . Years of education: None  . Highest education level: None  Social Needs  . Financial resource strain: None  . Food insecurity - worry: None  . Food insecurity - inability: None  . Transportation needs - medical: None  .  Transportation needs - non-medical: None  Occupational History  . None  Tobacco Use  . Smoking status: Current Every Day Smoker  . Smokeless tobacco: Never Used  Substance and Sexual Activity  . Alcohol use: Yes    Comment: heavy  . Drug use: No  . Sexual activity: None  Other Topics Concern  . None  Social History Narrative  . None    Hospital Course:   12/16/17 [facility] MD Assessment: 42 year old male, presented to ED voluntarily  due to worsening depression, suicidal ideations, with thoughts of shooting self .  Endorses neuro-vegetative symptoms of depression as below. Also endorses vague auditory hallucinations telling him he should die or kill self, but states " I know they are my own thoughts in my own head". At this time does not appear internally preoccupied . He reports history of alcohol dependence and states he has been drinking more heavily recently especially since he has been out of work. States he has recently been drinking up to 2 pints of liquor per day . States he feels symptoms of depression and increased alcohol consumption occurred after the death of his father 2 years ago. States he feels his family goes to him for help, advice ,which he states is also a stressor because " I am trying to keep it together myself " . Admission BAL 88.   Patient remained on the [facility] unit for 6 days and stabilized with medication and therapy. Patient was started on Lexapro 10 mg Daily and used Trazodone 100 mg PO QHS PRN. He completed the Librium Detox protocol.  He was accepted to [facility] center, but his insurance ended on 12-21-17 but will be restarting in February. He has agreed to go to shelter until insurance is reinstated. Patient has shown improvement with improved mood, affect, sleep, appetite, and interaction. Patient was seen in the day room interacting with peers and staff appropriately. Patient has been attending groups . Patient denies any SI/HI/AVh and contracts for safety.  Patient agrees to go to [facility] and then to [facility]. Patient is provided with prescriptions and samples of his medications upon discharge.   Physical Findings: AIMS: Facial and Oral Movements Muscles of Facial Expression: None, normal Lips and Perioral Area: None, normal Jaw: None, normal Tongue: None, normal,Extremity Movements Upper (arms, wrists, hands, fingers): None, normal Lower (legs, knees, ankles, toes): None, normal, Trunk Movements Neck, shoulders, hips: None, normal, Overall Severity Severity of abnormal movements (highest score from questions above): None, normal Incapacitation due to abnormal movements: None, normal Patient's awareness of abnormal movements (rate only patient's report): No Awareness, Dental Status Current problems with teeth and/or dentures?: No Does patient usually wear dentures?: No  CIWA:  CIWA-Ar Total: 4 COWS:     Musculoskeletal: Strength & Muscle Tone: within normal limits Gait & Station: normal Patient leans: N/A  Psychiatric Specialty Exam: Physical Exam  Nursing note and vitals reviewed. Constitutional: He is oriented to person, place, and time. He appears well-developed and well-nourished.  Cardiovascular: Normal rate.  Respiratory: Effort normal.  Musculoskeletal: Normal range of motion.  Neurological: He is alert and oriented to person, place, and time.  Skin: Skin is warm.    Review of Systems  Constitutional: Negative.   HENT: Negative.   Eyes: Negative.   Respiratory: Negative.   Cardiovascular: Negative.   Gastrointestinal: Negative.   Genitourinary: Negative.   Musculoskeletal: Negative.   Skin: Negative.   Neurological: Negative.   Endo/Heme/Allergies: Negative.   Psychiatric/Behavioral: Negative.     Blood pressure 124/76, pulse 78, temperature (!) 97.5 F (36.4 C), temperature source Oral, resp. rate 16, height 5' (1.524 m), weight 70.8 kg (156 lb), SpO2 98 %.Body mass index is  30.47 kg/m.  General Appearance: Casual  Eye Contact:  Good  Speech:  Clear and Coherent and Normal Rate  Volume:  Normal  Mood:  Euthymic  Affect:  Congruent  Thought Process:  Goal Directed and Descriptions of Associations: Intact  Orientation:  Full (Time, Place, and Person)  Thought Content:  WDL  Suicidal Thoughts:  No  Homicidal Thoughts:  No  Memory:  Immediate;   Good Recent;   Good Remote;   Good  Judgement:  Good  Insight:  Good  Psychomotor Activity:  Normal  Concentration:  Concentration: Good and Attention Span: Good  Recall:  Good  Fund of Knowledge:  Good  Language:  Good  Akathisia:  No  Handed:  Right  AIMS (if indicated):     Assets:  Communication Skills Desire for Improvement Financial Resources/Insurance Housing Physical Health Social Support Transportation  ADL's:  Intact  Cognition:  WNL  Sleep:  Number of Hours: 6.25     Have you used any form of tobacco in the last 30 days? (Cigarettes, Smokeless Tobacco, Cigars, and/or Pipes): Yes  Has this patient used any form of tobacco in the last 30 days? (Cigarettes, Smokeless Tobacco, Cigars, and/or Pipes) Yes, Yes, A prescription for an FDA-approved tobacco cessation medication was offered at discharge and the patient refused  Blood Alcohol level:  Lab Results  Component Value Date  ETH 88 (H) 12/14/2017    Metabolic Disorder Labs:  No results found for: HGBA1C, MPG No results found for: PROLACTIN No results found for: CHOL, TRIG, HDL, CHOLHDL, VLDL, LDLCALC  See Psychiatric Specialty Exam and Suicide Risk Assessment completed by Attending Physician prior to discharge.  Discharge destination:  Home  Is patient on multiple antipsychotic therapies at discharge:  No   Has Patient had three or more failed trials of antipsychotic monotherapy by history:  No  Recommended Plan for Multiple Antipsychotic Therapies: NA   Allergies as of 12/22/2017   No Known Allergies     Medication List     TAKE these medications     Indication  escitalopram 10 MG tablet Commonly known as:  LEXAPRO Take 1 tablet (10 mg total) by mouth daily. For Mood control Start taking on:  12/23/2017  Indication:  mood stability   traZODone 100 MG tablet Commonly known as:  DESYREL Take 1 tablet (100 mg total) by mouth at bedtime as needed for sleep.  Indication:  Trouble Sleeping      Follow-up Information    [facility] Follow up on 12/21/2017.   Contact information: [address] [place] [address] [phone]        [facility] Follow up.   Why:  Please walk in within 3 days of hospital discharge to be assessed for outpatient mental health services including medication management/counseling. Please arrive by 8:30AM Monday-Friday. Thank you.  Contact information: [address] [place], [place] [address] Phone: [phone] Fax: [phone]          Follow-up recommendations:  Continue activity as tolerated. Continue diet as recommended by your PCP. Ensure to keep all appointments with outpatient providers.  Comments:  Patient is instructed prior to discharge to: Take all medications as prescribed by his/her mental healthcare provider. Report any adverse effects and or reactions from the medicines to his/her outpatient provider promptly. Patient has been instructed & cautioned: To not engage in alcohol and or illegal drug use while on prescription medicines. In the event of worsening symptoms, patient is instructed to call the crisis hotline, 911 and or go to the nearest ED for appropriate evaluation and treatment of symptoms. To follow-up with his/her primary care provider for your other medical issues, concerns and or health care needs.    Signed: Gerlene Burdockravis B Money, FNP 12/22/2017, 8:40 AM

## 2017-12-22 NOTE — Tx Team (Signed)
Interdisciplinary Treatment and Diagnostic Plan Update  12/22/2017 Time of Session: [id number] Aaron [name] MRN: [id number]  Principal Diagnosis: MDD (major depressive disorder), recurrent, severe, with psychosis (HCC)  Secondary Diagnoses: Principal Problem:   MDD (major depressive disorder), recurrent, severe, with psychosis (HCC)   Current Medications:  Current Facility-Administered Medications  Medication Dose Route Frequency Provider Last Rate Last Dose  . acetaminophen (TYLENOL) tablet 650 mg  650 mg Oral Q6H PRN Rankin, Shuvon B, NP   650 mg at 12/19/17 2115  . alum & mag hydroxide-simeth (MAALOX/MYLANTA) 200-200-20 MG/5ML suspension 30 mL  30 mL Oral Q4H PRN Rankin, Shuvon B, NP      . escitalopram (LEXAPRO) tablet 10 mg  10 mg Oral Daily Money, Gerlene Burdockravis B, FNP   10 mg at 12/22/17 [id number]  . magnesium hydroxide (MILK OF MAGNESIA) suspension 30 mL  30 mL Oral Daily PRN Rankin, Shuvon B, NP      . traZODone (DESYREL) tablet 100 mg  100 mg Oral QHS,MR X 1 Oneta RackLewis, Tanika N, NP   100 mg at 12/21/17 2230   PTA Medications: No medications prior to admission.    Patient Stressors: Medication change or noncompliance Occupational concerns Substance abuse  Patient Strengths: Wellsite geologistCommunication skills General fund of knowledge Motivation for treatment/growth Physical Health Work skills  Treatment Modalities: Medication Management, Group therapy, Case management,  1 to 1 session with clinician, Psychoeducation, Recreational therapy.   Physician Treatment Plan for Primary Diagnosis: MDD (major depressive disorder), recurrent, severe, with psychosis (HCC) Long Term Goal(s): Improvement in symptoms so as ready for discharge Improvement in symptoms so as ready for discharge   Short Term Goals: Ability to identify changes in lifestyle to reduce recurrence of condition will improve Ability to maintain clinical measurements within normal limits will improve Ability to identify triggers  associated with substance abuse/mental health issues will improve  Medication Management: Evaluate patient's response, side effects, and tolerance of medication regimen.  Therapeutic Interventions: 1 to 1 sessions, Unit Group sessions and Medication administration.  Evaluation of Outcomes: Adequate for discharge  Physician Treatment Plan for Secondary Diagnosis: Principal Problem:   MDD (major depressive disorder), recurrent, severe, with psychosis (HCC)  Long Term Goal(s): Improvement in symptoms so as ready for discharge Improvement in symptoms so as ready for discharge   Short Term Goals: Ability to identify changes in lifestyle to reduce recurrence of condition will improve Ability to maintain clinical measurements within normal limits will improve Ability to identify triggers associated with substance abuse/mental health issues will improve     Medication Management: Evaluate patient's response, side effects, and tolerance of medication regimen.  Therapeutic Interventions: 1 to 1 sessions, Unit Group sessions and Medication administration.  Evaluation of Outcomes: Adequate for discharge    RN Treatment Plan for Primary Diagnosis: MDD (major depressive disorder), recurrent, severe, with psychosis (HCC) Long Term Goal(s): Knowledge of disease and therapeutic regimen to maintain health will improve  Short Term Goals: Ability to remain free from injury will improve, Ability to disclose and discuss suicidal ideas and Ability to identify and develop effective coping behaviors will improve  Medication Management: RN will administer medications as ordered by provider, will assess and evaluate patient's response and provide education to patient for prescribed medication. RN will report any adverse and/or side effects to prescribing provider.  Therapeutic Interventions: 1 on 1 counseling sessions, Psychoeducation, Medication administration, Evaluate responses to treatment, Monitor vital  signs and CBGs as ordered, Perform/monitor CIWA, COWS, AIMS and Fall Risk screenings as ordered,  Perform wound care treatments as ordered.  Evaluation of Outcomes: Adequate for discharge   LCSW Treatment Plan for Primary Diagnosis: MDD (major depressive disorder), recurrent, severe, with psychosis (HCC) Long Term Goal(s): Safe transition to appropriate next level of care at discharge, Engage patient in therapeutic group addressing interpersonal concerns.  Short Term Goals: Engage patient in aftercare planning with referrals and resources, Facilitate patient progression through stages of change regarding substance use diagnoses and concerns and Identify triggers associated with mental health/substance abuse issues  Therapeutic Interventions: Assess for all discharge needs, 1 to 1 time with Social worker, Explore available resources and support systems, Assess for adequacy in community support network, Educate family and significant other(s) on suicide prevention, Complete Psychosocial Assessment, Interpersonal group therapy.  Evaluation of Outcomes: Adequate for discharge    Progress in Treatment: Attending groups: Yes. Participating in groups: Yes. Taking medication as prescribed: Yes. Toleration medication: Yes. Family/Significant other contact made: SPE completed with pt; pt declined to consent to family contact.  Patient understands diagnosis: Yes. Discussing patient identified problems/goals with staff: Yes. Medical problems stabilized or resolved: Yes. Denies suicidal/homicidal ideation: Yes. Issues/concerns per patient self-inventory: No. Other: n/a   New problem(s) identified: No, Describe:  n/a  New Short Term/Long Term Goal(s): detox, medication management for mood stabilization; elimination of SI thoughts; development of comprehensive mental wellness/sobriety plan.   Patient Goal: "to get into a rehab and then an [facility]."   Discharge Plan or Barriers: Pt declined at  [facility] because insurance lapsed. Pt plan to attend [employer] today and has money for train ticket. He will follow-up at [facility]. Pt plans to reinstate his insurance and will tentatively be accepted to [facility] on Feb 10th. AA/NA list for [facility] also provided for additional community support.   Reason for Continuation of Hospitalization: none  Estimated Length of Stay: Friday, 12/22/17  Attendees: Patient: 12/22/2017 8:40 AM  Physician: Dr. Jackquline Berlin MD; Dr. Altamese [place] MD 12/22/2017 8:40 AM  Nursing: Rayfield Citizen RN; Erskine Squibb RN 12/22/2017 8:40 AM  RN Care Manager: Onnie Boer CM 12/22/2017 8:40 AM  Social Worker: Chartered loss adjuster, LCSW 12/22/2017 8:40 AM  Recreational Therapist: x 12/22/2017 8:40 AM  Other: Armandina Stammer NP; Feliz Beam Money NP 12/22/2017 8:40 AM  Other:  12/22/2017 8:40 AM  Other: 12/22/2017 8:40 AM    Scribe for Treatment Team: Ledell Peoples Smart, LCSW 12/22/2017 8:40 AM

## 2019-07-18 ENCOUNTER — Encounter (HOSPITAL_COMMUNITY): Payer: Self-pay

## 2019-07-18 ENCOUNTER — Emergency Department (HOSPITAL_COMMUNITY)
Admission: EM | Admit: 2019-07-18 | Discharge: 2019-07-18 | Disposition: A | Discharge status: Home / Self Care | Payer: BLUE CROSS/BLUE SHIELD | Attending: Emergency Medicine | Admitting: Emergency Medicine

## 2019-07-18 ENCOUNTER — Other Ambulatory Visit: Payer: Self-pay

## 2019-07-18 DIAGNOSIS — F17210 Nicotine dependence, cigarettes, uncomplicated: Secondary | ICD-10-CM | POA: Insufficient documentation

## 2019-07-18 DIAGNOSIS — R1033 Periumbilical pain: Secondary | ICD-10-CM | POA: Insufficient documentation

## 2019-07-18 DIAGNOSIS — R112 Nausea with vomiting, unspecified: Secondary | ICD-10-CM | POA: Insufficient documentation

## 2019-07-18 LAB — COMPREHENSIVE METABOLIC PANEL
ALT: 32 U/L (ref 0–44)
AST: 23 U/L (ref 15–41)
Albumin: 3.6 g/dL (ref 3.5–5.0)
Alkaline Phosphatase: 66 U/L (ref 38–126)
Anion gap: 11 (ref 5–15)
BUN: 12 mg/dL (ref 6–20)
CO2: 23 mmol/L (ref 22–32)
Calcium: 8.7 mg/dL — ABNORMAL LOW (ref 8.9–10.3)
Chloride: 105 mmol/L (ref 98–111)
Creatinine, Ser: 0.66 mg/dL (ref 0.61–1.24)
GFR calc Af Amer: >60 mL/min (ref >60)
GFR calc non Af Amer: >60 mL/min (ref >60)
Glucose, Bld: 96 mg/dL (ref 70–99)
Potassium: 4.1 mmol/L (ref 3.5–5.1)
Sodium: 139 mmol/L (ref 135–145)
Total Bilirubin: 0.2 mg/dL — ABNORMAL LOW (ref 0.3–1.2)
Total Protein: 7.5 g/dL (ref 6.5–8.1)

## 2019-07-18 LAB — LIPASE, BLOOD: Lipase: 31 U/L (ref 11–51)

## 2019-07-18 LAB — CBC
HCT: 44.2 % (ref 39.0–52.0)
Hemoglobin: 14.1 g/dL (ref 13.0–17.0)
MCH: 29.5 pg (ref 26.0–34.0)
MCHC: 31.9 g/dL (ref 30.0–36.0)
MCV: 92.5 fL (ref 80.0–100.0)
Platelets: 378 10*3/uL (ref 150–400)
RBC: 4.78 MIL/uL (ref 4.22–5.81)
RDW: 15.3 % (ref 11.5–15.5)
WBC: 6.9 10*3/uL (ref 4.0–10.5)
nRBC: 0 % (ref 0.0–0.2)

## 2019-07-18 MED ORDER — ONDANSETRON HCL 4 MG/2ML IJ SOLN
4.0000 mg | Freq: Once | INTRAMUSCULAR | Status: AC
  Administered 2019-07-18: 18:00:00 4 mg via INTRAVENOUS
  Filled 2019-07-18: qty 2

## 2019-07-18 MED ORDER — SODIUM CHLORIDE 0.9% FLUSH: 3.0000 mL | Freq: Once | INTRAVENOUS | Status: DC

## 2019-07-18 MED ORDER — SODIUM CHLORIDE 0.9 % IV BOLUS
1000.0000 mL | Freq: Once | INTRAVENOUS | Status: AC
  Administered 2019-07-18: 18:00:00 1000 mL via INTRAVENOUS

## 2019-07-18 MED ORDER — ONDANSETRON HCL 4 MG PO TABS: 4.0000 mg | ORAL_TABLET | Freq: Four times a day (QID) | ORAL | 0 refills | Status: DC

## 2019-07-18 NOTE — ED Notes (Signed)
Pt came back and now sitting in room waiting for d/c paper work. Nurse asked to resume care, replace iv continue care, pt refuses. Provider notified pt request for dc paper work. Urine at bed side to send down if pt wants to continue process of care.

## 2019-07-18 NOTE — ED Notes (Signed)
Pt yelled at nurse ripped IV out and said he is gone, this service "f"ing sucks. Provider notified. Says no one came to see him in 3 hrs.

## 2019-07-18 NOTE — ED Notes (Signed)
Pt left without v/s but agreed to sign d/c paper work and was happy to get medicine for his nausea.

## 2019-07-18 NOTE — ED Triage Notes (Signed)
patient c/o mid abdominal pain and emesis x 2 since this AM.

## 2019-07-18 NOTE — ED Notes (Signed)
Pt ambulatory from triage to room 

## 2019-07-29 NOTE — ED Provider Notes (Signed)
[facility] DEPT Provider Note  CSN: [id number] Arrival date & time: 07/18/19  1340     History   Chief Complaint Chief Complaint  Patient presents with  . Abdominal Pain  . Emesis    HPI Aaron [name] is a 43 y.o. male.   HPI   43 year old male with nausea and vomiting.  Onset around 2 AM this morning.  Multiple episodes since then.  Associated with some periumbilical abdominal pain which waxes and wanes without appreciable exacerbating relieving factors.  No blood in his emesis.  No diarrhea.  No fevers or chills.  No sick contacts he is aware of.  Past Medical History:  Diagnosis Date  . Depression     Patient Active Problem List   Diagnosis Date Noted  . MDD (major depressive disorder), recurrent, severe, with psychosis ([facility]) 12/15/2017    History reviewed. No pertinent surgical history.      Home Medications    Prior to Admission medications   Medication Sig Start Date End Date Taking? Authorizing Provider  escitalopram (LEXAPRO) 10 MG tablet Take 1 tablet (10 mg total) by mouth daily. For Mood control Patient not taking: Reported on 07/18/2019 12/23/17   Money, Lowry Ram, FNP  ondansetron (ZOFRAN) 4 MG tablet Take 1 tablet (4 mg total) by mouth every 6 (six) hours. 07/18/19   Virgel Manifold, MD  traZODone (DESYREL) 100 MG tablet Take 1 tablet (100 mg total) by mouth at bedtime as needed for sleep. Patient not taking: Reported on 07/18/2019 12/22/17   Money, Lowry Ram, FNP    Family History Family History  Problem Relation Age of Onset  . Cancer Father     Social History Social History   Tobacco Use  . Smoking status: Current Every Day Smoker    Packs/day: 0.25    Types: Cigarettes  . Smokeless tobacco: Never Used  Substance Use Topics  . Alcohol use: Yes    Comment: heavy  . Drug use: No     Allergies   Patient has no known allergies.   Review of Systems Review of Systems  All systems reviewed and  negative, other than as noted in HPI.  Physical Exam Updated Vital Signs BP 101/73   Pulse 63   Temp 98 F (36.7 C) (Oral)   Resp 19   Ht 5\' 1"  (1.549 m)   Wt 71.7 kg   SpO2 99%   BMI 29.85 kg/m   Physical Exam Vitals signs and nursing note reviewed.  Constitutional:      General: He is not in acute distress.    Appearance: He is well-developed.  HENT:     Head: Normocephalic and atraumatic.  Eyes:     General:        Right eye: No discharge.        Left eye: No discharge.     Conjunctiva/sclera: Conjunctivae normal.  Neck:     Musculoskeletal: Neck supple.  Cardiovascular:     Rate and Rhythm: Normal rate and regular rhythm.     Heart sounds: Normal heart sounds. No murmur. No friction rub. No gallop.   Pulmonary:     Effort: Pulmonary effort is normal. No respiratory distress.     Breath sounds: Normal breath sounds.  Abdominal:     General: There is no distension.     Palpations: Abdomen is soft.     Tenderness: There is abdominal tenderness.     Comments: Mild periumbilical tenderness without rebound or guarding.  No distention.  Musculoskeletal:        General: No tenderness.  Skin:    General: Skin is warm and dry.  Neurological:     Mental Status: He is alert.  Psychiatric:        Behavior: Behavior normal.        Thought Content: Thought content normal.      ED Treatments / Results  Labs (all labs ordered are listed, but only abnormal results are displayed) Labs Reviewed  COMPREHENSIVE METABOLIC PANEL - Abnormal; Notable for the following components:      Result Value   Calcium 8.7 (*)    Total Bilirubin 0.2 (*)    All other components within normal limits  LIPASE, BLOOD  CBC    EKG None  Radiology No results found.  Procedures Procedures (including critical care time)  Medications Ordered in ED Medications  sodium chloride 0.9 % bolus 1,000 mL (0 mLs Intravenous Stopped 07/18/19 2027)  ondansetron (ZOFRAN) injection 4 mg (4 mg  Intravenous Given 07/18/19 1806)     Initial Impression / Assessment and Plan / ED Course  I have reviewed the triage vital signs and the nursing notes.  Pertinent labs & imaging results that were available during my care of the patient were reviewed by me and considered in my medical decision making (see chart for details).  [age] year old male with nausea vomiting abdominal pain.  Suspect viral gastritis.  Minimal tenderness on exam.  Afebrile.  Hemodynamically stable.  ED work-up fairly unremarkable.  She is symptomatically improvement of symptoms.   It has been determined that no acute conditions requiring further emergency intervention are present at this time. The patient has been advised of the diagnosis and plan. I reviewed any labs and imaging including any potential incidental findings. I have reviewed nursing notes and appropriate previous records. We have discussed signs and symptoms that warrant return to the ED and they are listed in the discharge instructions.      Final Clinical Impressions(s) / ED Diagnoses   Final diagnoses:  Nausea and vomiting, intractability of vomiting not specified, unspecified vomiting type    ED Discharge Orders         Ordered    ondansetron (ZOFRAN) 4 MG tablet  Every 6 hours     07/18/19 2030           Raeford RazorKohut, Kaprice Kage, MD 07/31/19 [id number]

## 2020-12-15 ENCOUNTER — Encounter (HOSPITAL_COMMUNITY): Payer: Self-pay | Admitting: Emergency Medicine

## 2020-12-15 ENCOUNTER — Other Ambulatory Visit: Payer: Self-pay

## 2020-12-15 ENCOUNTER — Emergency Department (HOSPITAL_COMMUNITY): Payer: Self-pay

## 2020-12-15 ENCOUNTER — Emergency Department (HOSPITAL_COMMUNITY)
Admission: EM | Admit: 2020-12-15 | Discharge: 2020-12-16 | Disposition: A | Discharge status: Home / Self Care | Payer: Self-pay | Attending: Emergency Medicine | Admitting: Emergency Medicine

## 2020-12-15 DIAGNOSIS — F17210 Nicotine dependence, cigarettes, uncomplicated: Secondary | ICD-10-CM | POA: Insufficient documentation

## 2020-12-15 DIAGNOSIS — R519 Headache, unspecified: Secondary | ICD-10-CM | POA: Insufficient documentation

## 2020-12-15 DIAGNOSIS — Z20822 Contact with and (suspected) exposure to covid-19: Secondary | ICD-10-CM | POA: Insufficient documentation

## 2020-12-15 DIAGNOSIS — R110 Nausea: Secondary | ICD-10-CM

## 2020-12-15 DIAGNOSIS — R0789 Other chest pain: Secondary | ICD-10-CM

## 2020-12-15 LAB — BASIC METABOLIC PANEL
Anion gap: 16 — ABNORMAL HIGH (ref 5–15)
BUN: 8 mg/dL (ref 6–20)
CO2: 22 mmol/L (ref 22–32)
Calcium: 9.3 mg/dL (ref 8.9–10.3)
Chloride: 98 mmol/L (ref 98–111)
Creatinine, Ser: 0.74 mg/dL (ref 0.61–1.24)
GFR, Estimated: >60 mL/min (ref >60)
Glucose, Bld: 109 mg/dL — ABNORMAL HIGH (ref 70–99)
Potassium: 3.6 mmol/L (ref 3.5–5.1)
Sodium: 136 mmol/L (ref 135–145)

## 2020-12-15 LAB — CBC
HCT: 47 % (ref 39.0–52.0)
Hemoglobin: 15.8 g/dL (ref 13.0–17.0)
MCH: 29.8 pg (ref 26.0–34.0)
MCHC: 33.6 g/dL (ref 30.0–36.0)
MCV: 88.5 fL (ref 80.0–100.0)
Platelets: 405 10*3/uL — ABNORMAL HIGH (ref 150–400)
RBC: 5.31 MIL/uL (ref 4.22–5.81)
RDW: 14.8 % (ref 11.5–15.5)
WBC: 8.4 10*3/uL (ref 4.0–10.5)
nRBC: 0 % (ref 0.0–0.2)

## 2020-12-15 LAB — TROPONIN I (HIGH SENSITIVITY)
Troponin I (High Sensitivity): 5 ng/L (ref <18)
Troponin I (High Sensitivity): 4 ng/L (ref <18)

## 2020-12-15 NOTE — ED Triage Notes (Signed)
Pt states he started having chest pain this morning. Pt states it makes his nauseous and weak all over.

## 2020-12-16 LAB — SARS CORONAVIRUS 2 (TAT 6-24 HRS): SARS Coronavirus 2: NEGATIVE

## 2020-12-16 MED ORDER — ONDANSETRON 4 MG PO TBDP
8.0000 mg | ORAL_TABLET | Freq: Once | ORAL | Status: AC
  Administered 2020-12-16: 8 mg via ORAL
  Filled 2020-12-16: qty 2

## 2020-12-16 MED ORDER — ONDANSETRON 8 MG PO TBDP: ORAL_TABLET | ORAL | 0 refills | Status: DC

## 2020-12-16 NOTE — ED Provider Notes (Signed)
[facility] EMERGENCY DEPARTMENT Provider Note   CSN: [id number] Arrival date & time: 12/15/20  1727     History Chief Complaint  Patient presents with  . Chest Pain    Santanna Olenik III is a 45 y.o. male.  Patient is a 45 year old male with history of depression.  He presents today for evaluation of multiple symptoms.  Patient states that he woke this morning, "feeling horrible".  He describes tightness in his chest, headache, and nauseated.  He denies fevers or chills.  He denies productive cough.  Patient has no prior cardiac history.  He denies any recent exertional symptoms.  The history is provided by the patient.  Chest Pain Pain location:  Substernal area Pain quality: tightness   Pain radiates to:  Does not radiate Pain severity:  Mild Onset quality:  Sudden Duration:  24 hours Timing:  Constant Progression:  Worsening Chronicity:  New Relieved by:  Nothing Worsened by:  Nothing      Past Medical History:  Diagnosis Date  . Depression     Patient Active Problem List   Diagnosis Date Noted  . 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 No pertinent surgical history.     Family History  Problem Relation Age of Onset  . Cancer Father     Social History   Tobacco Use  . Smoking status: Current Every Day Smoker    Packs/day: 0.25    Types: Cigarettes  . Smokeless tobacco: Never Used  Vaping Use  . Vaping Use: Never used  Substance Use Topics  . Alcohol use: Yes    Comment: heavy  . Drug use: No    Home Medications Prior to Admission medications   Medication Sig Start Date End Date Taking? Authorizing Provider  escitalopram (LEXAPRO) 10 MG tablet Take 1 tablet (10 mg total) by mouth daily. For Mood control Patient not taking: Reported on 07/18/2019 12/23/17   Money, Gerlene Burdock, FNP  ondansetron (ZOFRAN) 4 MG tablet Take 1 tablet (4 mg total) by mouth every 6  (six) hours. 07/18/19   Raeford Razor, MD  traZODone (DESYREL) 100 MG tablet Take 1 tablet (100 mg total) by mouth at bedtime as needed for sleep. Patient not taking: Reported on 07/18/2019 12/22/17   Money, Gerlene Burdock, FNP    Allergies    Patient has no known allergies.  Review of Systems   Review of Systems  Cardiovascular: Positive for chest pain.  All other systems reviewed and are negative.   Physical Exam Updated Vital Signs BP 113/84   Pulse 98   Temp 97.7 F (36.5 C) (Oral)   Resp 14   SpO2 98%   Physical Exam Vitals and nursing note reviewed.  Constitutional:      General: He is not in acute distress.    Appearance: He is well-developed and well-nourished. He is not diaphoretic.  HENT:     Head: Normocephalic and atraumatic.     Mouth/Throat:     Mouth: Oropharynx is clear and moist.  Cardiovascular:     Rate and Rhythm: Normal rate and regular rhythm.     Heart sounds: No murmur heard. No friction rub.  Pulmonary:     Effort: Pulmonary effort is normal. No respiratory distress.     Breath sounds: Normal breath sounds. No wheezing or rales.  Abdominal:     General: Bowel sounds are normal. There is no distension.     Palpations:  Abdomen is soft.     Tenderness: There is no abdominal tenderness.  Musculoskeletal:        General: No edema. Normal range of motion.     Cervical back: Normal range of motion and neck supple.     Right lower leg: No tenderness. No edema.     Left lower leg: No tenderness. No edema.  Skin:    General: Skin is warm and dry.  Neurological:     Mental Status: He is alert and oriented to person, place, and time.     Coordination: Coordination normal.     ED Results / Procedures / Treatments   Labs (all labs ordered are listed, but only abnormal results are displayed) Labs Reviewed  BASIC METABOLIC PANEL - Abnormal; Notable for the following components:      Result Value   Glucose, Bld 109 (*)    Anion gap 16 (*)    All other  components within normal limits  CBC - Abnormal; Notable for the following components:   Platelets 405 (*)    All other components within normal limits  SARS CORONAVIRUS 2 (TAT 6-24 HRS)  TROPONIN I (HIGH SENSITIVITY)  TROPONIN I (HIGH SENSITIVITY)    EKG EKG Interpretation  Date/Time:  Tuesday December 15 2020 18:40:54 EST Ventricular Rate:  107 PR Interval:  134 QRS Duration: 76 QT Interval:  310 QTC Calculation: 413 R Axis:   62 Text Interpretation: Sinus tachycardia T wave abnormality, consider inferolateral ischemia Abnormal ECG No prior ecg for comparison Confirmed by Geoffery Lyons ([id number]) on 12/16/2020 1:01:50 AM   Radiology DG Chest 2 View  Result Date: 12/15/2020 CLINICAL DATA:  Chest pain and cough. EXAM: CHEST - 2 VIEW COMPARISON:  None. FINDINGS: The heart size and mediastinal contours are within normal limits. Both lungs are clear. The visualized skeletal structures are unremarkable. IMPRESSION: No active cardiopulmonary disease. Electronically Signed   By: Katherine Mantle M.D.   On: 12/15/2020 19:50    Procedures Procedures   Medications Ordered in ED Medications  ondansetron (ZOFRAN-ODT) disintegrating tablet 8 mg (has no administration in time range)    ED Course  I have reviewed the triage vital signs and the nursing notes.  Pertinent labs & imaging results that were available during my care of the patient were reviewed by me and considered in my medical decision making (see chart for details).    MDM Rules/Calculators/A&P  Patient is a 45 year old male presenting with complaints of chest tightness, nausea, headache, and feeling generally unwell.  His symptoms started yesterday morning.  He does have nonspecific findings on his EKG, but negative troponin x2.  His symptoms are atypical for a cardiac etiology and do not feel as though additional work-up is necessary into this.  A Covid test has been obtained and patient seems appropriate for discharge.   He was given Zofran which helped his nausea and he was able to tolerate fluids without difficulty.  He is to return as needed for any problems.  Final Clinical Impression(s) / ED Diagnoses Final diagnoses:  None    Rx / DC Orders ED Discharge Orders    None       Geoffery Lyons, MD 12/16/20 [phone]

## 2020-12-16 NOTE — Discharge Instructions (Addendum)
Begin taking Zofran as prescribed as needed for nausea.  Drink plenty of fluids and get plenty of rest.  Take ibuprofen 600 mg every 6 hours as needed for pain.  Isolate at home until the results of your Covid test are known.  Return to the emergency department for worsening chest pain, difficulty breathing, or other new and concerning symptoms.

## 2020-12-16 NOTE — ED Notes (Addendum)
Pt d/c by MD and is provided with d/c instructions and follow up care, Pt is out of the ED ambulatory

## 2020-12-16 NOTE — ED Notes (Signed)
Pt tolerated fluids well without feeling nausea notified MD

## 2021-03-10 ENCOUNTER — Emergency Department (HOSPITAL_COMMUNITY)
Admission: EM | Admit: 2021-03-10 | Discharge: 2021-03-10 | Disposition: A | Discharge status: Home / Self Care | Payer: Self-pay | Attending: Emergency Medicine | Admitting: Emergency Medicine

## 2021-03-10 ENCOUNTER — Other Ambulatory Visit: Payer: Self-pay

## 2021-03-10 ENCOUNTER — Encounter (HOSPITAL_COMMUNITY): Payer: Self-pay | Admitting: Emergency Medicine

## 2021-03-10 DIAGNOSIS — J069 Acute upper respiratory infection, unspecified: Secondary | ICD-10-CM | POA: Insufficient documentation

## 2021-03-10 DIAGNOSIS — Z20822 Contact with and (suspected) exposure to covid-19: Secondary | ICD-10-CM | POA: Insufficient documentation

## 2021-03-10 DIAGNOSIS — F17210 Nicotine dependence, cigarettes, uncomplicated: Secondary | ICD-10-CM | POA: Insufficient documentation

## 2021-03-10 LAB — RESP PANEL BY RT-PCR (FLU A&B, COVID) ARPGX2
Influenza A by PCR: NEGATIVE
Influenza B by PCR: NEGATIVE
SARS Coronavirus 2 by RT PCR: NEGATIVE

## 2021-03-10 NOTE — ED Triage Notes (Signed)
Emergency Medicine Provider Triage Evaluation Note  Aaron [name] , a 45 y.o. male  was evaluated in triage.  Pt complains of headache and scratchy throat.  Review of Systems  Positive: Headache, scratchy throat, cough Negative: sob  Physical Exam  BP 120/80 (BP Location: Left Arm)   Pulse 74   Temp 97.7 F (36.5 C) (Oral)   Resp 18   Ht 5\' 1"  (1.549 m)   Wt 72 kg   SpO2 98%   BMI 29.99 kg/m  Gen:   Awake, no distress   HEENT:  Atraumatic, no pharyngeal erythema/edema Resp:  Normal effort  Cardiac:  Normal rate  Abd:   Nondistended, MSK:   Moves extremities without difficulty  Neuro:  Speech clear   Medical Decision Making  Medically screening exam initiated at 2:14 PM.  Appropriate orders placed.  [name] was informed that the remainder of the evaluation will be completed by another provider, this initial triage assessment does not replace that evaluation, and the importance of remaining in the ED until their evaluation is complete.  Clinical Impression   MSE was initiated and I personally evaluated the patient and placed orders (if any) at  2:14 PM on March 10, 2021.  The patient appears stable so that the remainder of the MSE may be completed by another provider.    March 12, 2021, PA-C 03/10/21 1414

## 2021-03-10 NOTE — ED Provider Notes (Signed)
[facility]-EMERGENCY DEPT Provider Note   CSN: [id number] Arrival date & time: 03/10/21  1319     History Chief Complaint  Patient presents with  . Headache  . dry throat     Aaron [name] is a 45 y.o. male.  HPI   Patient is a 45 year old male who presents the emergency department today for evaluation of a headache, scratchy throat and intermittent cough ongoing for the last 1 to 2 days.  Denies any fevers or shortness of breath.  Denies any other associated symptoms including no chest pain, neurologic complaints.  Past Medical History:  Diagnosis Date  . Depression     Patient Active Problem List   Diagnosis Date Noted  . 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 No pertinent surgical history.     Family History  Problem Relation Age of Onset  . Cancer Father     Social History   Tobacco Use  . Smoking status: Current Every Day Smoker    Packs/day: 0.25    Types: Cigarettes  . Smokeless tobacco: Never Used  Vaping Use  . Vaping Use: Never used  Substance Use Topics  . Alcohol use: Yes    Comment: heavy  . Drug use: No    Home Medications Prior to Admission medications   Medication Sig Start Date End Date Taking? Authorizing Provider  escitalopram (LEXAPRO) 10 MG tablet Take 1 tablet (10 mg total) by mouth daily. For Mood control Patient not taking: Reported on 07/18/2019 12/23/17   Money, Gerlene Burdock, FNP  ondansetron (ZOFRAN ODT) 8 MG disintegrating tablet 8mg  ODT q4 hours prn nausea 12/16/20   12/18/20, MD  ondansetron (ZOFRAN) 4 MG tablet Take 1 tablet (4 mg total) by mouth every 6 (six) hours. 07/18/19   07/20/19, MD  traZODone (DESYREL) 100 MG tablet Take 1 tablet (100 mg total) by mouth at bedtime as needed for sleep. Patient not taking: Reported on 07/18/2019 12/22/17   Money, 02/19/18, FNP    Allergies    Patient has no known allergies.  Review  of Systems   Review of Systems  Constitutional: Negative for fever.  HENT: Positive for sore throat. Negative for ear pain.   Eyes: Negative for pain and visual disturbance.  Respiratory: Positive for cough. Negative for shortness of breath.   Cardiovascular: Negative for chest pain.  Gastrointestinal: Negative for abdominal pain, constipation, diarrhea, nausea and vomiting.  Genitourinary: Negative for dysuria and hematuria.  Musculoskeletal: Negative for back pain.  Skin: Negative for rash.  Neurological: Positive for headaches. Negative for dizziness, weakness, light-headedness and numbness.  All other systems reviewed and are negative.   Physical Exam Updated Vital Signs BP 120/80 (BP Location: Left Arm)   Pulse 74   Temp 97.7 F (36.5 C) (Oral)   Resp 18   Ht 5\' 1"  (1.549 m)   Wt 72 kg   SpO2 98%   BMI 29.99 kg/m   Physical Exam Vitals and nursing note reviewed.  Constitutional:      Appearance: He is well-developed.  HENT:     Head: Normocephalic and atraumatic.     Mouth/Throat:     Pharynx: Uvula midline. No pharyngeal swelling, oropharyngeal exudate, posterior oropharyngeal erythema or uvula swelling.     Tonsils: No tonsillar exudate or tonsillar abscesses. 1+ on the right. 1+ on the left.  Eyes:     Conjunctiva/sclera: Conjunctivae normal.  Cardiovascular:  Rate and Rhythm: Normal rate and regular rhythm.     Heart sounds: Normal heart sounds. No murmur heard.   Pulmonary:     Effort: Pulmonary effort is normal. No respiratory distress.     Breath sounds: Normal breath sounds. No wheezing, rhonchi or rales.  Abdominal:     General: Bowel sounds are normal.     Palpations: Abdomen is soft.     Tenderness: There is no abdominal tenderness. There is no guarding.  Musculoskeletal:     Cervical back: Neck supple.  Skin:    General: Skin is warm and dry.  Neurological:     Mental Status: He is alert.     ED Results / Procedures / Treatments    Labs (all labs ordered are listed, but only abnormal results are displayed) Labs Reviewed  RESP PANEL BY RT-PCR (FLU A&B, COVID) [id number]    EKG None  Radiology No results found.  Procedures Procedures   Medications Ordered in ED Medications - No data to display  ED Course  I have reviewed the triage vital signs and the nursing notes.  Pertinent labs & imaging results that were available during my care of the patient were reviewed by me and considered in my medical decision making (see chart for details).    MDM Rules/Calculators/A&P                          Pt here for uri sxs. Patients symptoms are consistent with URI, likely viral etiology. Covid/flu ordered in the ed and pending at discharge. Pt to f/u on mychart. Discussed that antibiotics are not indicated for viral infections. Pt will be discharged with symptomatic treatment.  Verbalizes understanding and is agreeable with plan. Pt is hemodynamically stable & in NAD prior to dc.   Final Clinical Impression(s) / ED Diagnoses Final diagnoses:  Viral URI    Rx / DC Orders ED Discharge Orders    None       Rayne Du 03/10/21 1605    Jacalyn Lefevre, MD 03/10/21 1605

## 2021-03-10 NOTE — Discharge Instructions (Signed)
Please follow up with your primary care provider within 5-7 days for re-evaluation of your symptoms. If you do not have a primary care provider, information for a healthcare clinic has been provided for you to make arrangements for follow up care. Please return to the emergency department for any new or worsening symptoms. ° °

## 2021-03-10 NOTE — ED Triage Notes (Signed)
Patient complains that he was at work last night and noticed that he had a very dry throat, some wheezing and a headache. Took Advil w/o relief last night.

## 2021-04-01 ENCOUNTER — Emergency Department (HOSPITAL_COMMUNITY)
Admission: EM | Admit: 2021-04-01 | Discharge: 2021-04-02 | Disposition: A | Discharge status: Home / Self Care | Payer: Self-pay | Attending: Emergency Medicine | Admitting: Emergency Medicine

## 2021-04-01 ENCOUNTER — Emergency Department (HOSPITAL_COMMUNITY): Payer: Self-pay

## 2021-04-01 ENCOUNTER — Other Ambulatory Visit: Payer: Self-pay

## 2021-04-01 ENCOUNTER — Emergency Department (HOSPITAL_COMMUNITY)
Admission: EM | Admit: 2021-04-01 | Discharge: 2021-04-01 | Disposition: A | Discharge status: Home / Self Care | Payer: Self-pay | Attending: Emergency Medicine | Admitting: Emergency Medicine

## 2021-04-01 ENCOUNTER — Encounter (HOSPITAL_COMMUNITY): Payer: Self-pay

## 2021-04-01 DIAGNOSIS — F17210 Nicotine dependence, cigarettes, uncomplicated: Secondary | ICD-10-CM | POA: Insufficient documentation

## 2021-04-01 DIAGNOSIS — R1030 Lower abdominal pain, unspecified: Secondary | ICD-10-CM | POA: Insufficient documentation

## 2021-04-01 DIAGNOSIS — R109 Unspecified abdominal pain: Secondary | ICD-10-CM

## 2021-04-01 LAB — CBC WITH DIFFERENTIAL/PLATELET
Abs Immature Granulocytes: 0.02 10*3/uL (ref 0.00–0.07)
Basophils Absolute: 0.1 10*3/uL (ref 0.0–0.1)
Basophils Relative: 1 %
Eosinophils Absolute: 0.2 10*3/uL (ref 0.0–0.5)
Eosinophils Relative: 3 %
HCT: 44.4 % (ref 39.0–52.0)
Hemoglobin: 14.6 g/dL (ref 13.0–17.0)
Immature Granulocytes: 0 %
Lymphocytes Relative: 26 %
Lymphs Abs: 2.1 10*3/uL (ref 0.7–4.0)
MCH: 29.7 pg (ref 26.0–34.0)
MCHC: 32.9 g/dL (ref 30.0–36.0)
MCV: 90.2 fL (ref 80.0–100.0)
Monocytes Absolute: 0.8 10*3/uL (ref 0.1–1.0)
Monocytes Relative: 10 %
Neutro Abs: 4.9 10*3/uL (ref 1.7–7.7)
Neutrophils Relative %: 60 %
Platelets: 363 10*3/uL (ref 150–400)
RBC: 4.92 MIL/uL (ref 4.22–5.81)
RDW: 14.6 % (ref 11.5–15.5)
WBC: 8.1 10*3/uL (ref 4.0–10.5)
nRBC: 0 % (ref 0.0–0.2)

## 2021-04-01 LAB — URINALYSIS, ROUTINE W REFLEX MICROSCOPIC
Bilirubin Urine: NEGATIVE
Glucose, UA: NEGATIVE mg/dL
Hgb urine dipstick: NEGATIVE
Ketones, ur: NEGATIVE mg/dL
Leukocytes,Ua: NEGATIVE
Nitrite: NEGATIVE
Protein, ur: NEGATIVE mg/dL
Specific Gravity, Urine: 1.023 (ref 1.005–1.030)
pH: 6 (ref 5.0–8.0)

## 2021-04-01 LAB — COMPREHENSIVE METABOLIC PANEL
ALT: 45 U/L — ABNORMAL HIGH (ref 0–44)
AST: 33 U/L (ref 15–41)
Albumin: 3.6 g/dL (ref 3.5–5.0)
Alkaline Phosphatase: 62 U/L (ref 38–126)
Anion gap: 7 (ref 5–15)
BUN: 12 mg/dL (ref 6–20)
CO2: 27 mmol/L (ref 22–32)
Calcium: 8.9 mg/dL (ref 8.9–10.3)
Chloride: 99 mmol/L (ref 98–111)
Creatinine, Ser: 0.72 mg/dL (ref 0.61–1.24)
GFR, Estimated: >60 mL/min (ref >60)
Glucose, Bld: 103 mg/dL — ABNORMAL HIGH (ref 70–99)
Potassium: 3.7 mmol/L (ref 3.5–5.1)
Sodium: 133 mmol/L — ABNORMAL LOW (ref 135–145)
Total Bilirubin: 0.7 mg/dL (ref 0.3–1.2)
Total Protein: 7.3 g/dL (ref 6.5–8.1)

## 2021-04-01 LAB — LIPASE, BLOOD: Lipase: 30 U/L (ref 11–51)

## 2021-04-01 MED ORDER — DICYCLOMINE HCL 20 MG PO TABS: 20.0000 mg | ORAL_TABLET | Freq: Two times a day (BID) | ORAL | 0 refills | Status: DC

## 2021-04-01 MED ORDER — IBUPROFEN 400 MG PO TABS: 400.0000 mg | ORAL_TABLET | Freq: Three times a day (TID) | ORAL | 0 refills | Status: AC

## 2021-04-01 MED ORDER — FENTANYL CITRATE (PF) 100 MCG/2ML IJ SOLN
50.0000 ug | Freq: Once | INTRAMUSCULAR | Status: AC
  Administered 2021-04-01: 50 ug via INTRAMUSCULAR
  Filled 2021-04-01: qty 2

## 2021-04-01 MED ORDER — KETOROLAC TROMETHAMINE 30 MG/ML IJ SOLN
15.0000 mg | Freq: Once | INTRAMUSCULAR | Status: AC
  Administered 2021-04-01: 15 mg via INTRAVENOUS
  Filled 2021-04-01: qty 1

## 2021-04-01 MED ORDER — SODIUM CHLORIDE 0.9 % IV BOLUS
1000.0000 mL | Freq: Once | INTRAVENOUS | Status: AC
  Administered 2021-04-01: 1000 mL via INTRAVENOUS

## 2021-04-01 MED ORDER — KETOROLAC TROMETHAMINE 60 MG/2ML IM SOLN
60.0000 mg | Freq: Once | INTRAMUSCULAR | Status: AC
  Administered 2021-04-01: 60 mg via INTRAMUSCULAR
  Filled 2021-04-01: qty 2

## 2021-04-01 MED ORDER — DICYCLOMINE HCL 10 MG PO CAPS
10.0000 mg | ORAL_CAPSULE | Freq: Once | ORAL | Status: AC
  Administered 2021-04-01: 10 mg via ORAL
  Filled 2021-04-01: qty 1

## 2021-04-01 NOTE — ED Provider Notes (Signed)
[facility]-EMERGENCY DEPT Provider Note   CSN: [id number] Arrival date & time: 04/01/21  1203     History Chief Complaint  Patient presents with  . Abdominal Pain  . Flank Pain    Aaron [name] is a 45 y.o. male.  HPI Patient presents with 2 weeks of right-sided abdominal pain.  Onset was without clear precipitant.  Since that time he has had pain right lateral abdomen, right flank.  No dysuria, hematuria, vomiting, diarrhea, fever. No clear alleviating or exacerbating factors including OTC medication. No skin changes. Has a history of prior appendectomy.  He states that he is generally well.  He does smoke, drink. 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    Past Medical History:  Diagnosis Date  . Depression     Patient Active Problem List   Diagnosis Date Noted  . 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 No pertinent surgical history.     Family History  Problem Relation Age of Onset  . Cancer Father     Social History   Tobacco Use  . Smoking status: Current Every Day Smoker    Packs/day: 0.25    Types: Cigarettes  . Smokeless tobacco: Never Used  Vaping Use  . Vaping Use: Never used  Substance Use Topics  . Alcohol use: Yes    Comment: heavy  . Drug use: No    Home Medications Prior to Admission medications   Medication Sig Start Date End Date Taking? Authorizing Provider  escitalopram (LEXAPRO) 10 MG tablet Take 1 tablet (10 mg total) by mouth daily. For Mood control Patient not taking: No sig reported 12/23/17   Money, Gerlene Burdock, FNP  ondansetron (ZOFRAN ODT) 8 MG disintegrating tablet 8mg  ODT q4 hours prn nausea Patient not taking: No sig reported 12/16/20   12/18/20, MD  ondansetron (ZOFRAN) 4 MG tablet Take 1 tablet (4 mg total) by mouth every 6 (six) hours. Patient not taking: No sig reported 07/18/19   07/20/19, MD  traZODone (DESYREL) 100 MG tablet  Take 1 tablet (100 mg total) by mouth at bedtime as needed for sleep. Patient not taking: No sig reported 12/22/17   Money, 02/19/18, FNP    Allergies    Patient has no known allergies.  Review of Systems   Review of Systems  Constitutional:       Per HPI, otherwise negative  HENT:       Per HPI, otherwise negative  Respiratory:       Per HPI, otherwise negative  Cardiovascular:       Per HPI, otherwise negative  Gastrointestinal: Positive for abdominal pain. Negative for vomiting.  Endocrine:       Negative aside from HPI  Genitourinary:       Neg aside from HPI   Musculoskeletal:       Per HPI, otherwise negative  Skin: Negative.   Neurological: Negative for syncope.    Physical Exam Updated Vital Signs BP 125/88   Pulse 78   Temp 98.3 F (36.8 C)   Resp 16   SpO2 99%   Physical Exam Vitals and nursing note reviewed.  Constitutional:      General: He is not in acute distress.    Appearance: He is well-developed.  HENT:     Head: Normocephalic and atraumatic.  Eyes:     Conjunctiva/sclera: Conjunctivae normal.  Cardiovascular:     Rate and Rhythm: Normal rate  and regular rhythm.  Pulmonary:     Effort: Pulmonary effort is normal. No respiratory distress.     Breath sounds: No stridor.  Abdominal:     General: There is no distension.     Tenderness: There is abdominal tenderness.     Comments: R lateral pain  Skin:    General: Skin is warm and dry.  Neurological:     Mental Status: He is alert and oriented to person, place, and time.     ED Results / Procedures / Treatments   Labs (all labs ordered are listed, but only abnormal results are displayed) Labs Reviewed  COMPREHENSIVE METABOLIC PANEL - Abnormal; Notable for the following components:      Result Value   Sodium 133 (*)    Glucose, Bld 103 (*)    ALT 45 (*)    All other components within normal limits  CBC WITH DIFFERENTIAL/PLATELET  LIPASE, BLOOD  URINALYSIS, ROUTINE W REFLEX  MICROSCOPIC    EKG None  Radiology CT Renal Stone Study  Result Date: 04/01/2021 CLINICAL DATA:  Right flank pain EXAM: CT ABDOMEN AND PELVIS WITHOUT CONTRAST TECHNIQUE: Multidetector CT imaging of the abdomen and pelvis was performed following the standard protocol without IV contrast. COMPARISON:  None. FINDINGS: Lower chest: No acute abnormality. Hepatobiliary: No focal liver abnormality is seen. No gallstones, gallbladder wall thickening, or biliary dilatation. Pancreas: Unremarkable. Spleen: Unremarkable. Adrenals/Urinary Tract: Adrenals are unremarkable. No renal calculi. No hydronephrosis. Ureters are normal in caliber. Bladder is unremarkable. Stomach/Bowel: Stomach is within normal limits. Bowel is normal in caliber. Few small right-sided colonic diverticula. Vascular/Lymphatic: No significant vascular findings. Reproductive: Prostate is unremarkable. Other: No free fluid.  Abdominal wall is unremarkable. Musculoskeletal: No acute osseous abnormality. Mild lumbar spine degenerative changes. IMPRESSION: No acute abnormality.  Specifically, no urinary tract calculi. Electronically Signed   By: Guadlupe Spanish M.D.   On: 04/01/2021 13:39    Procedures Procedures   Medications Ordered in ED Medications  sodium chloride 0.9 % bolus 1,000 mL (1,000 mLs Intravenous New Bag/Given 04/01/21 1510)  ketorolac (TORADOL) 30 MG/ML injection 15 mg (15 mg Intravenous Given 04/01/21 1510)    ED Course  I have reviewed the triage vital signs and the nursing notes.  Pertinent labs & imaging results that were available during my care of the patient were reviewed by me and considered in my medical decision making (see chart for details).    3:46 PM On repeat exam patient awake, alert, hemodynamically unremarkable.  I reviewed his CT scan, and I discussed it as well as lab results with him.  No evidence for acute findings, some suspicion for passed kidney stone given his description of pain in the right  flank.  Other considerations include musculoskeletal etiology given his occupation.  No evidence for bacteremia, sepsis, other acute abdominal phenomena. Patient amenable to, appropriate for discharge with ongoing anti-inflammatories, outpatient follow-up. MDM Rules/Calculators/A&P MDM Number of Diagnoses or Management Options Right flank pain: new, needed workup   Amount and/or Complexity of Data Reviewed Clinical lab tests: ordered and reviewed Tests in the radiology section of CPT: ordered and reviewed Tests in the medicine section of CPT: ordered and reviewed Decide to obtain previous medical records or to obtain history from someone other than the patient: yes Review and summarize past medical records: yes Independent visualization of images, tracings, or specimens: yes  Risk of Complications, Morbidity, and/or Mortality Presenting problems: high Diagnostic procedures: high Management options: high  Critical Care Total time providing  critical care: < 30 minutes  Patient Progress Patient progress: improved  Final Clinical Impression(s) / ED Diagnoses Final diagnoses:  Right flank pain    Rx / DC Orders ED Discharge Orders         Ordered    ibuprofen (ADVIL) 400 MG tablet  3 times daily        04/01/21 1547           Gerhard Munch, MD 04/01/21 1548

## 2021-04-01 NOTE — ED Provider Notes (Signed)
Emergency Medicine Provider Triage Evaluation Note  Aaron [name] , a 45 y.o. male  was evaluated in triage.  Pt complains of right lower quadrant ain with radiation to right flank and back  Pain is constant but waxes and wanes in intensity.  Pain is worse with movement.  8/10 on the pain scale.  History of appendectomy.  No recent falls or injuries.     Review of Systems  Positive: Abdominal pain Negative: Fever, chills, urinary symptoms, nausea, vomiting, melena, blood in stool  Physical Exam  BP 118/78 (BP Location: Left Arm)   Pulse 78   Temp 98.3 F (36.8 C)   Resp 17   SpO2 98%  Gen:   Awake, no distress   Resp:  Normal effort  MSK:   Moves extremities without difficulty  Other:  Abdomen soft, nondistended, tenderness to right lower quadrant and flank, positive right CVA tenderness  Medical Decision Making  Medically screening exam initiated at 12:42 PM.  Appropriate orders placed.  Aaron [name] was informed that the remainder of the evaluation will be completed by another provider, this initial triage assessment does not replace that evaluation, and the importance of remaining in the ED until their evaluation is complete.  The patient appears stable so that the remainder of the work up may be completed by another provider.      Haskel Schroeder, PA-C 04/01/21 1246    Pollyann Savoy, MD 04/01/21 1355

## 2021-04-01 NOTE — Discharge Instructions (Signed)
As discussed, your evaluation today has been largely reassuring.  But, it is important that you monitor your condition carefully, and do not hesitate to return to the ED if you develop new, or concerning changes in your condition. ? ?Otherwise, please follow-up with your physician for appropriate ongoing care. ? ?

## 2021-04-01 NOTE — Discharge Instructions (Signed)
Please continue to take ibuprofen, use Bentyl as needed for pain.  Please follow-up with [facility] for follow-up.  Return to the ER for any new or worsening symptoms.

## 2021-04-01 NOTE — ED Triage Notes (Signed)
Pt to ED by POV from home with c/o abdominal pain. Pt was seen here for the same earlier today. States no improvement. Arrives A+O, VSS.

## 2021-04-01 NOTE — ED Triage Notes (Signed)
Pt presents with c/o right lower quad abdominal pain that radiates to his right flank for 2 weeks.

## 2021-04-01 NOTE — ED Provider Notes (Signed)
[facility]-EMERGENCY DEPT Provider Note   CSN: [id number] Arrival date & time: 04/01/21  2047     History Chief Complaint  Patient presents with  . Abdominal Pain    Aaron [name] is a 45 y.o. male.  HPI 45 year old male with history of depression presents to the ER with complaints of right-sided abdominal pain.  Seen just several hours prior with the same complaint.  He had a CT scan done earlier which did not find any acute findings.  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  Patient presents with complaints that his pain has not improved.  He states that he did take the ibuprofen that was prescribed to him.  He denies any other changes in his symptoms.    Past Medical History:  Diagnosis Date  . Depression     Patient Active Problem List   Diagnosis Date Noted  . 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 No pertinent surgical history.     Family History  Problem Relation Age of Onset  . Cancer Father     Social History   Tobacco Use  . Smoking status: Current Every Day Smoker    Packs/day: 0.25    Types: Cigarettes  . Smokeless tobacco: Never Used  Vaping Use  . Vaping Use: Never used  Substance Use Topics  . Alcohol use: Yes    Comment: heavy  . Drug use: No    Home Medications Prior to Admission medications   Medication Sig Start Date End Date Taking? Authorizing Provider  dicyclomine (BENTYL) 20 MG tablet Take 1 tablet (20 mg total) by mouth 2 (two) times daily. 04/01/21  Yes Mare Ferrari, PA-C  escitalopram (LEXAPRO) 10 MG tablet Take 1 tablet (10 mg total) by mouth daily. For Mood control Patient not taking: No sig reported 12/23/17   Money, Gerlene Burdock, FNP  ibuprofen (ADVIL) 400 MG tablet Take 1 tablet (400 mg total) by mouth 3 (three) times daily for 3 days. Take one tablet three times daily for three days 04/01/21  04/04/21  Gerhard Munch, MD  ondansetron (ZOFRAN ODT) 8 MG disintegrating tablet 8mg  ODT q4 hours prn nausea Patient not taking: No sig reported 12/16/20   12/18/20, MD  ondansetron (ZOFRAN) 4 MG tablet Take 1 tablet (4 mg total) by mouth every 6 (six) hours. Patient not taking: No sig reported 07/18/19   07/20/19, MD  traZODone (DESYREL) 100 MG tablet Take 1 tablet (100 mg total) by mouth at bedtime as needed for sleep. Patient not taking: No sig reported 12/22/17   Money, 02/19/18, FNP    Allergies    Patient has no known allergies.  Review of Systems   Review of Systems  Gastrointestinal: Positive for abdominal pain. Negative for nausea and vomiting.  Musculoskeletal: Positive for back pain.    Physical Exam Updated Vital Signs BP 112/63 (BP Location: Right Arm)   Pulse 83   Temp 98.1 F (36.7 C) (Oral)   Resp 18   Ht 5\' 1"  (1.549 m)   Wt 72.6 kg   SpO2 96%   BMI 30.24 kg/m   Physical Exam Vitals and nursing note reviewed.  Constitutional:      Appearance: He is well-developed.  HENT:     Head: Normocephalic and atraumatic.  Eyes:     Conjunctiva/sclera: Conjunctivae normal.  Cardiovascular:     Rate and Rhythm: Normal rate  and regular rhythm.     Heart sounds: No murmur heard.   Pulmonary:     Effort: Pulmonary effort is normal. No respiratory distress.     Breath sounds: Normal breath sounds.  Abdominal:     Palpations: Abdomen is soft.     Tenderness: There is abdominal tenderness.     Comments: Mild generalized lower right abdominal tenderness, no peritoneal signs, no focal tenderness, with mild CVA tenderness.  No visible rashes.  Musculoskeletal:     Cervical back: Neck supple.  Skin:    General: Skin is warm and dry.  Neurological:     Mental Status: He is alert.     ED Results / Procedures / Treatments   Labs (all labs ordered are listed, but only abnormal results are displayed) Labs Reviewed - No data to  display  EKG None  Radiology CT Renal Stone Study  Result Date: 04/01/2021 CLINICAL DATA:  Right flank pain EXAM: CT ABDOMEN AND PELVIS WITHOUT CONTRAST TECHNIQUE: Multidetector CT imaging of the abdomen and pelvis was performed following the standard protocol without IV contrast. COMPARISON:  None. FINDINGS: Lower chest: No acute abnormality. Hepatobiliary: No focal liver abnormality is seen. No gallstones, gallbladder wall thickening, or biliary dilatation. Pancreas: Unremarkable. Spleen: Unremarkable. Adrenals/Urinary Tract: Adrenals are unremarkable. No renal calculi. No hydronephrosis. Ureters are normal in caliber. Bladder is unremarkable. Stomach/Bowel: Stomach is within normal limits. Bowel is normal in caliber. Few small right-sided colonic diverticula. Vascular/Lymphatic: No significant vascular findings. Reproductive: Prostate is unremarkable. Other: No free fluid.  Abdominal wall is unremarkable. Musculoskeletal: No acute osseous abnormality. Mild lumbar spine degenerative changes. IMPRESSION: No acute abnormality.  Specifically, no urinary tract calculi. Electronically Signed   By: Guadlupe Spanish M.D.   On: 04/01/2021 13:39    Procedures Procedures   Medications Ordered in ED Medications  dicyclomine (BENTYL) capsule 10 mg (10 mg Oral Given 04/01/21 2205)  ketorolac (TORADOL) injection 60 mg (60 mg Intramuscular Given 04/01/21 2206)  fentaNYL (SUBLIMAZE) injection 50 mcg (50 mcg Intramuscular Given 04/01/21 2208)    ED Course  I have reviewed the triage vital signs and the nursing notes.  Pertinent labs & imaging results that were available during my care of the patient were reviewed by me and considered in my medical decision making (see chart for details).    MDM Rules/Calculators/A&P                          45 year old male with complaints of abdominal pain and back pain.  Seen here several hours prior with overall reassuring work-up.  No changes in his symptoms, he just  reports that his pain is not any better.  I did explain that his work-up here was reassuring, and that we have ruled out any emergent/life-threatening causes of his pain.  Lower suspicion for dissection as he has no chest pain, shortness of breath, no dizziness.  Patient was given Toradol, fentanyl, Bentyl.  Patient was also seen and eval by Dr. Jeraldine Loots.  He is agreeable to discharge with follow-up with the PCP.  He is not insured, will refer to [facility].  We will also add on Bentyl. Final Clinical Impression(s) / ED Diagnoses Final diagnoses:  Right flank pain    Rx / DC Orders ED Discharge Orders         Ordered    dicyclomine (BENTYL) 20 MG tablet  2 times daily        04/01/21  [address]           Leone Brand 04/01/21 2329    Gerhard Munch, MD 04/01/21 [phone]

## 2021-04-06 ENCOUNTER — Emergency Department (HOSPITAL_COMMUNITY)
Admission: EM | Admit: 2021-04-06 | Discharge: 2021-04-06 | Disposition: A | Discharge status: Home / Self Care | Payer: Self-pay | Attending: Emergency Medicine | Admitting: Emergency Medicine

## 2021-04-06 ENCOUNTER — Emergency Department (HOSPITAL_COMMUNITY): Payer: Self-pay

## 2021-04-06 ENCOUNTER — Other Ambulatory Visit: Payer: Self-pay

## 2021-04-06 ENCOUNTER — Encounter (HOSPITAL_COMMUNITY): Payer: Self-pay | Admitting: Emergency Medicine

## 2021-04-06 DIAGNOSIS — F10129 Alcohol abuse with intoxication, unspecified: Secondary | ICD-10-CM | POA: Insufficient documentation

## 2021-04-06 DIAGNOSIS — F17210 Nicotine dependence, cigarettes, uncomplicated: Secondary | ICD-10-CM | POA: Insufficient documentation

## 2021-04-06 DIAGNOSIS — F10929 Alcohol use, unspecified with intoxication, unspecified: Secondary | ICD-10-CM

## 2021-04-06 DIAGNOSIS — R400 Somnolence: Secondary | ICD-10-CM | POA: Insufficient documentation

## 2021-04-06 DIAGNOSIS — Y906 Blood alcohol level of 120-199 mg/100 ml: Secondary | ICD-10-CM | POA: Insufficient documentation

## 2021-04-06 DIAGNOSIS — R109 Unspecified abdominal pain: Secondary | ICD-10-CM | POA: Insufficient documentation

## 2021-04-06 LAB — COMPREHENSIVE METABOLIC PANEL
ALT: 49 U/L — ABNORMAL HIGH (ref 0–44)
AST: 32 U/L (ref 15–41)
Albumin: 3.7 g/dL (ref 3.5–5.0)
Alkaline Phosphatase: 62 U/L (ref 38–126)
Anion gap: 12 (ref 5–15)
BUN: 10 mg/dL (ref 6–20)
CO2: 25 mmol/L (ref 22–32)
Calcium: 8.1 mg/dL — ABNORMAL LOW (ref 8.9–10.3)
Chloride: 102 mmol/L (ref 98–111)
Creatinine, Ser: 0.77 mg/dL (ref 0.61–1.24)
GFR, Estimated: >60 mL/min (ref >60)
Glucose, Bld: 137 mg/dL — ABNORMAL HIGH (ref 70–99)
Potassium: 3.4 mmol/L — ABNORMAL LOW (ref 3.5–5.1)
Sodium: 139 mmol/L (ref 135–145)
Total Bilirubin: 0.3 mg/dL (ref 0.3–1.2)
Total Protein: 7.5 g/dL (ref 6.5–8.1)

## 2021-04-06 LAB — CBC WITH DIFFERENTIAL/PLATELET
Abs Immature Granulocytes: 0.03 10*3/uL (ref 0.00–0.07)
Basophils Absolute: 0 10*3/uL (ref 0.0–0.1)
Basophils Relative: 1 %
Eosinophils Absolute: 0.1 10*3/uL (ref 0.0–0.5)
Eosinophils Relative: 2 %
HCT: 46.1 % (ref 39.0–52.0)
Hemoglobin: 15.6 g/dL (ref 13.0–17.0)
Immature Granulocytes: 0 %
Lymphocytes Relative: 35 %
Lymphs Abs: 2.5 10*3/uL (ref 0.7–4.0)
MCH: 30.5 pg (ref 26.0–34.0)
MCHC: 33.8 g/dL (ref 30.0–36.0)
MCV: 90.2 fL (ref 80.0–100.0)
Monocytes Absolute: 0.5 10*3/uL (ref 0.1–1.0)
Monocytes Relative: 8 %
Neutro Abs: 3.8 10*3/uL (ref 1.7–7.7)
Neutrophils Relative %: 54 %
Platelets: 318 10*3/uL (ref 150–400)
RBC: 5.11 MIL/uL (ref 4.22–5.81)
RDW: 14.7 % (ref 11.5–15.5)
WBC: 7 10*3/uL (ref 4.0–10.5)
nRBC: 0 % (ref 0.0–0.2)

## 2021-04-06 LAB — RAPID URINE DRUG SCREEN, HOSP PERFORMED
Amphetamines: NOT DETECTED
Barbiturates: NOT DETECTED
Benzodiazepines: NOT DETECTED
Cocaine: NOT DETECTED
Opiates: NOT DETECTED
Tetrahydrocannabinol: NOT DETECTED

## 2021-04-06 LAB — URINALYSIS, ROUTINE W REFLEX MICROSCOPIC
Bacteria, UA: NONE SEEN
Bilirubin Urine: NEGATIVE
Glucose, UA: NEGATIVE mg/dL
Ketones, ur: NEGATIVE mg/dL
Nitrite: NEGATIVE
Protein, ur: NEGATIVE mg/dL
Specific Gravity, Urine: 1.02 (ref 1.005–1.030)
pH: 5 (ref 5.0–8.0)

## 2021-04-06 LAB — BLOOD GAS, VENOUS
Acid-base deficit: 0.2 mmol/L (ref 0.0–2.0)
Bicarbonate: 26.4 mmol/L (ref 20.0–28.0)
O2 Saturation: 94.9 %
Patient temperature: 98.6
pCO2, Ven: 52.6 mmHg (ref 44.0–60.0)
pH, Ven: 7.322 (ref 7.250–7.430)
pO2, Ven: 87.1 mmHg — ABNORMAL HIGH (ref 32.0–45.0)

## 2021-04-06 LAB — ETHANOL: Alcohol, Ethyl (B): 189 mg/dL — ABNORMAL HIGH (ref <10)

## 2021-04-06 LAB — LIPASE, BLOOD: Lipase: 35 U/L (ref 11–51)

## 2021-04-06 MED ORDER — NALOXONE HCL 0.4 MG/ML IJ SOLN
0.4000 mg | Freq: Once | INTRAMUSCULAR | Status: AC
  Administered 2021-04-06: 0.4 mg via INTRAVENOUS
  Filled 2021-04-06: qty 1

## 2021-04-06 NOTE — ED Triage Notes (Addendum)
Patient presents with abdominal pain which has worsened since he was last seen 5/12. He reports the medication given helped him for a little while. Patient says he has been drinking alcohol since he was last seen.

## 2021-04-06 NOTE — ED Provider Notes (Signed)
[facility]-EMERGENCY DEPT Provider Note   CSN: [id number] Arrival date & time: 04/06/21  1558     History Chief Complaint  Patient presents with  . Abdominal Pain    Aaron [name] is a 45 y.o. male.  The history is provided by the patient and medical records. No language interpreter was used.     45 year old male significant history of depression, alcohol use, presenting for evaluation of abdominal pain.  History is limited as patient appears to be very drowsy and sleepy.  He is able to answer question after repeat questioning.  He states he has been drinking alcohol heavily for the past several days.  He did not tell me much about his abdominal pain or having any nausea or vomiting or diarrhea.  He did not tell me about whether he had fever or chills.  When asked if he is having any suicidal homicidal thoughts and he nodded his head.  This evaluation is limited due to his current state.  Level 5 caveat due to altered mental status.  Patient was recently Seen in the ED 5 days ago home for complaints of flank pain.  He did had an abdominal pelvis CT scan performed at that time without any acute finding.  He did return to the ED multiple times in the same day for the same complaint.  Past Medical History:  Diagnosis Date  . Depression     Patient Active Problem List   Diagnosis Date Noted  . 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     Family History  Problem Relation Age of Onset  . Cancer Father     Social History   Tobacco Use  . Smoking status: Current Every Day Smoker    Packs/day: 0.25    Types: Cigarettes  . Smokeless tobacco: Never Used  Vaping Use  . Vaping Use: Never used  Substance Use Topics  . Alcohol use: Yes    Comment: heavy  . Drug use: No    Home Medications Prior to Admission medications   Medication Sig Start Date End Date Taking?  Authorizing Provider  dicyclomine (BENTYL) 20 MG tablet Take 1 tablet (20 mg total) by mouth 2 (two) times daily. 04/01/21   Mare Ferrari, PA-C  escitalopram (LEXAPRO) 10 MG tablet Take 1 tablet (10 mg total) by mouth daily. For Mood control Patient not taking: No sig reported 12/23/17   Money, Gerlene Burdock, FNP  ondansetron (ZOFRAN ODT) 8 MG disintegrating tablet 8mg  ODT q4 hours prn nausea Patient not taking: No sig reported 12/16/20   12/18/20, MD  ondansetron (ZOFRAN) 4 MG tablet Take 1 tablet (4 mg total) by mouth every 6 (six) hours. Patient not taking: No sig reported 07/18/19   07/20/19, MD  traZODone (DESYREL) 100 MG tablet Take 1 tablet (100 mg total) by mouth at bedtime as needed for sleep. Patient not taking: No sig reported 12/22/17   Money, 02/19/18, FNP    Allergies    Patient has no known allergies.  Review of Systems   Review of Systems  Unable to perform ROS: Mental status change    Physical Exam Updated Vital Signs BP (!) 158/90 (BP Location: Right Arm)   Pulse 91   Temp 98 F (36.7 C) (Oral)   Resp 18   Ht 5\' 1"  (1.549 m)   Wt 72.6 kg   SpO2 96%  BMI 30.24 kg/m   Physical Exam Vitals and nursing note reviewed.  Constitutional:      General: He is not in acute distress.    Appearance: He is well-developed.     Comments: Very sleepy and hard to arouse but does not appear to be in any acute distress  HENT:     Head: Atraumatic.  Eyes:     Extraocular Movements: Extraocular movements intact.     Conjunctiva/sclera: Conjunctivae normal.     Pupils: Pupils are equal, round, and reactive to light.  Cardiovascular:     Rate and Rhythm: Normal rate.     Heart sounds: Normal heart sounds.  Pulmonary:     Effort: Pulmonary effort is normal.     Breath sounds: Normal breath sounds. No wheezing, rhonchi or rales.  Abdominal:     General: Abdomen is flat. Bowel sounds are normal.     Palpations: Abdomen is soft.     Tenderness: There is no abdominal  tenderness.  Musculoskeletal:     Cervical back: Neck supple.  Skin:    Findings: No rash.  Neurological:     GCS: GCS eye subscore is 3. GCS verbal subscore is 4. GCS motor subscore is 6.     ED Results / Procedures / Treatments   Labs (all labs ordered are listed, but only abnormal results are displayed) Labs Reviewed  COMPREHENSIVE METABOLIC PANEL - Abnormal; Notable for the following components:      Result Value   Potassium 3.4 (*)    Glucose, Bld 137 (*)    Calcium 8.1 (*)    ALT 49 (*)    All other components within normal limits  ETHANOL - Abnormal; Notable for the following components:   Alcohol, Ethyl (B) 189 (*)    All other components within normal limits  BLOOD GAS, VENOUS - Abnormal; Notable for the following components:   pO2, Ven 87.1 (*)    All other components within normal limits  CBC WITH DIFFERENTIAL/PLATELET  LIPASE, BLOOD  URINALYSIS, ROUTINE W REFLEX MICROSCOPIC  RAPID URINE DRUG SCREEN, HOSP PERFORMED    EKG None  Radiology CT Head Wo Contrast  Result Date: 04/06/2021 CLINICAL DATA:  Delirium.  Headache for 2 days. EXAM: CT HEAD WITHOUT CONTRAST TECHNIQUE: Contiguous axial images were obtained from the base of the skull through the vertex without intravenous contrast. COMPARISON:  None. FINDINGS: Brain: The brainstem, cerebellum, cerebral peduncles, thalami, basal ganglia, basilar cisterns, and ventricular system appear within normal limits. 1.5 by 0.5 cm calcific density along the right side of the falx on image 21 series 2 is likely a densely calcified small meningioma, no abnormality of the adjacent right frontal lobe is observed. Uniform density along the falx and tentorium is likely incidental. No intracranial hemorrhage, significant mass lesion, or acute CVA. Vascular: Unremarkable Skull: Unremarkable Sinuses/Orbits: Unremarkable Other: Unremarkable IMPRESSION: 1. No acute intracranial findings. 2. 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 Electronically Signed   By: Gaylyn Rong M.D.   On: 04/06/2021 18:24    Procedures Procedures   Medications Ordered in ED Medications  naloxone [facility]) injection 0.4 mg (0.4 mg Intravenous Given 04/06/21 1808)    ED Course  I have reviewed the triage vital signs and the nursing notes.  Pertinent labs & imaging results that were available during my care of the patient were reviewed by me and considered in my medical decision making (see chart for details).    MDM Rules/Calculators/A&P  BP 103/72   Pulse 91   Resp (!) 22   Ht 5\' 1"  (1.549 m)   Wt 72.6 kg   SpO2 99%   BMI 30.24 kg/m   Final Clinical Impression(s) / ED Diagnoses Final diagnoses:  Alcoholic intoxication with complication (HCC)    Rx / DC Orders ED Discharge Orders    None     4:35 PM Patient initially complaining of flank pain and abdominal pain.  Was seen 5 days prior for the same complaint and had negative abdomen pelvis CT scan.  Is here with report of heavy alcohol consumption for the past several days.  This could contribute to his pain.  His abdomen is soft nontender on exam.  Patient does have history of depression and when asked if he is suicidal aware prompt him to drink quite a bit, he did not but did not go into further detail.  Work-up initiated  5:35 PM Patient still drowsy, O2 sats dropping, patient placed on nasal cannula, will obtain VBG, head CT scan, will give Narcan as well  8:00 PM Head CT scan obtained without any acute finding.  Labs are mostly reassuring.  Patient is intoxicated as his alcohol level is 189 likely explain his current symptoms.  He has normal lipase low suspicion for pancreatitis.  He has been monitoring, at this time more awake and felt better  9:52 PM Ambulate without difficulty, no hypoxia, stable for discharge.  Encourage patient to avoid alcohol abuse, return precaution given    , PA-C 04/06/21 2202     2203, MD 04/07/21 [phone]

## 2021-04-06 NOTE — ED Notes (Signed)
Patient ambulated without complaining of dizziness or shortness of breath. O2 sats remained in the 90's, heart rate increased to 120.

## 2021-04-07 ENCOUNTER — Emergency Department (HOSPITAL_COMMUNITY)
Admission: EM | Admit: 2021-04-07 | Discharge: 2021-04-08 | Disposition: A | Discharge status: Home / Self Care | Payer: Self-pay | Attending: Emergency Medicine | Admitting: Emergency Medicine

## 2021-04-07 ENCOUNTER — Encounter (HOSPITAL_COMMUNITY): Payer: Self-pay

## 2021-04-07 ENCOUNTER — Other Ambulatory Visit: Payer: Self-pay

## 2021-04-07 DIAGNOSIS — R109 Unspecified abdominal pain: Secondary | ICD-10-CM | POA: Insufficient documentation

## 2021-04-07 DIAGNOSIS — R45851 Suicidal ideations: Secondary | ICD-10-CM

## 2021-04-07 DIAGNOSIS — F32A Depression, unspecified: Secondary | ICD-10-CM

## 2021-04-07 DIAGNOSIS — Z20822 Contact with and (suspected) exposure to covid-19: Secondary | ICD-10-CM | POA: Insufficient documentation

## 2021-04-07 DIAGNOSIS — F17210 Nicotine dependence, cigarettes, uncomplicated: Secondary | ICD-10-CM | POA: Insufficient documentation

## 2021-04-07 NOTE — ED Provider Notes (Signed)
[facility]-EMERGENCY DEPT Provider Note   CSN: [id number] Arrival date & time: 04/07/21  2131     History Chief Complaint  Patient presents with  . Suicidal    Aaron [name] is a 45 y.o. male.  Patient to ED with depression, suicidal ideations, "I just want to end it all". No HI/AVH. No self harm prior to coming to the ED. No physical complaints. He was seen last night for abdominal pain. Denies ongoing abdominal pain tonight.   The history is provided by the patient. No language interpreter was used.       Past Medical History:  Diagnosis Date  . Depression     Patient Active Problem List   Diagnosis Date Noted  . 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 No pertinent surgical history.     Family History  Problem Relation Age of Onset  . Cancer Father     Social History   Tobacco Use  . Smoking status: Current Every Day Smoker    Packs/day: 0.25    Types: Cigarettes  . Smokeless tobacco: Never Used  Vaping Use  . Vaping Use: Never used  Substance Use Topics  . Alcohol use: Yes    Comment: heavy  . Drug use: No    Home Medications Prior to Admission medications   Medication Sig Start Date End Date Taking? Authorizing Provider  dicyclomine (BENTYL) 20 MG tablet Take 1 tablet (20 mg total) by mouth 2 (two) times daily. 04/01/21   Mare Ferrari, PA-C  escitalopram (LEXAPRO) 10 MG tablet Take 1 tablet (10 mg total) by mouth daily. For Mood control Patient not taking: No sig reported 12/23/17   Money, Gerlene Burdock, FNP  ondansetron (ZOFRAN ODT) 8 MG disintegrating tablet 8mg  ODT q4 hours prn nausea Patient not taking: No sig reported 12/16/20   12/18/20, MD  ondansetron (ZOFRAN) 4 MG tablet Take 1 tablet (4 mg total) by mouth every 6 (six) hours. Patient not taking: No sig reported 07/18/19   07/20/19, MD  traZODone (DESYREL) 100 MG tablet Take 1 tablet (100  mg total) by mouth at bedtime as needed for sleep. Patient not taking: No sig reported 12/22/17   Money, 02/19/18, FNP    Allergies    Patient has no known allergies.  Review of Systems   Review of Systems  Constitutional: Negative for chills and fever.  HENT: Negative.   Respiratory: Negative.   Cardiovascular: Negative.   Gastrointestinal: Negative.   Musculoskeletal: Negative.   Skin: Negative.   Neurological: Negative.   Psychiatric/Behavioral: Positive for dysphoric mood and suicidal ideas. Negative for self-injury.    Physical Exam Updated Vital Signs BP 119/62 (BP Location: Left Arm)   Pulse 78   Temp 98.4 F (36.9 C) (Oral)   Resp 16   SpO2 98%   Physical Exam Vitals and nursing note reviewed.  Constitutional:      Appearance: He is well-developed.  HENT:     Head: Normocephalic.  Cardiovascular:     Rate and Rhythm: Normal rate and regular rhythm.  Pulmonary:     Effort: Pulmonary effort is normal.     Breath sounds: Normal breath sounds. No wheezing, rhonchi or rales.  Abdominal:     General: Bowel sounds are normal.     Palpations: Abdomen is soft.     Tenderness: There is no abdominal tenderness. There is no guarding or rebound.  Musculoskeletal:  General: Normal range of motion.     Cervical back: Normal range of motion and neck supple.  Skin:    General: Skin is warm and dry.     Findings: No rash.  Neurological:     Mental Status: He is alert and oriented to person, place, and time.     ED Results / Procedures / Treatments   Labs (all labs ordered are listed, but only abnormal results are displayed) Labs Reviewed  CBC WITH DIFFERENTIAL/PLATELET  SALICYLATE LEVEL  ETHANOL  COMPREHENSIVE METABOLIC PANEL  ACETAMINOPHEN LEVEL  RAPID URINE DRUG SCREEN, HOSP PERFORMED    EKG None  Radiology CT Head Wo Contrast  Result Date: 04/06/2021 CLINICAL DATA:  Delirium.  Headache for 2 days. EXAM: CT HEAD WITHOUT CONTRAST TECHNIQUE:  Contiguous axial images were obtained from the base of the skull through the vertex without intravenous contrast. COMPARISON:  None. FINDINGS: Brain: The brainstem, cerebellum, cerebral peduncles, thalami, basal ganglia, basilar cisterns, and ventricular system appear within normal limits. 1.5 by 0.5 cm calcific density along the right side of the falx on image 21 series 2 is likely a densely calcified small meningioma, no abnormality of the adjacent right frontal lobe is observed. Uniform density along the falx and tentorium is likely incidental. No intracranial hemorrhage, significant mass lesion, or acute CVA. Vascular: Unremarkable Skull: Unremarkable Sinuses/Orbits: Unremarkable Other: Unremarkable IMPRESSION: 1. No acute intracranial findings. 2. Small likely incidental right para falcine meningioma. Electronically Signed   By: Gaylyn Rong M.D.   On: 04/06/2021 18:24    Procedures Procedures   Medications Ordered in ED Medications - No data to display  ED Course  I have reviewed the triage vital signs and the nursing notes.  Pertinent labs & imaging results that were available during my care of the patient were reviewed by me and considered in my medical decision making (see chart for details).    MDM Rules/Calculators/A&P                          Patient to ED with SI, depression, wants to "end it all". History of the same.   Lab review from full panel labs reviewed from 5/17. He is considered medically cleared. Patient calm, cooperative.    11:20 - TTS requested.   5:45 - patient has been resting comfortably. VSS. TTS remains pending.   Patient care signed out to oncoming provider team to determine disposition.    Final Clinical Impression(s) / ED Diagnoses Final diagnoses:  None   1. Depression 2. Suicidal ideation  Rx / DC Orders ED Discharge Orders    None       Elpidio Anis, PA-C 04/08/21 0549    Bethann Berkshire, MD 04/09/21 [phone]

## 2021-04-07 NOTE — ED Triage Notes (Signed)
Pt states he just wants it all to end. Pt states he wants to end his life and he thought about shooting himself in the head. Pt calm and cooperative at this time.

## 2021-04-08 ENCOUNTER — Encounter (HOSPITAL_COMMUNITY): Payer: Self-pay | Admitting: Psychiatry

## 2021-04-08 ENCOUNTER — Inpatient Hospital Stay (HOSPITAL_COMMUNITY)
Admission: AD | Admit: 2021-04-08 | Discharge: 2021-04-12 | DRG: 897 | Disposition: A | Discharge status: Home / Self Care | Payer: Federal, State, Local not specified - Other | Source: Intra-hospital | Attending: Psychiatry | Admitting: Psychiatry

## 2021-04-08 DIAGNOSIS — R110 Nausea: Secondary | ICD-10-CM | POA: Diagnosis present

## 2021-04-08 DIAGNOSIS — F333 Major depressive disorder, recurrent, severe with psychotic symptoms: Secondary | ICD-10-CM | POA: Diagnosis present

## 2021-04-08 DIAGNOSIS — F1994 Other psychoactive substance use, unspecified with psychoactive substance-induced mood disorder: Secondary | ICD-10-CM | POA: Diagnosis present

## 2021-04-08 DIAGNOSIS — R109 Unspecified abdominal pain: Secondary | ICD-10-CM | POA: Diagnosis present

## 2021-04-08 DIAGNOSIS — G4700 Insomnia, unspecified: Secondary | ICD-10-CM | POA: Diagnosis present

## 2021-04-08 DIAGNOSIS — R45851 Suicidal ideations: Secondary | ICD-10-CM | POA: Diagnosis present

## 2021-04-08 DIAGNOSIS — F1024 Alcohol dependence with alcohol-induced mood disorder: Principal | ICD-10-CM | POA: Diagnosis present

## 2021-04-08 DIAGNOSIS — Y906 Blood alcohol level of 120-199 mg/100 ml: Secondary | ICD-10-CM | POA: Diagnosis present

## 2021-04-08 DIAGNOSIS — F10230 Alcohol dependence with withdrawal, uncomplicated: Secondary | ICD-10-CM | POA: Diagnosis not present

## 2021-04-08 DIAGNOSIS — F419 Anxiety disorder, unspecified: Secondary | ICD-10-CM | POA: Diagnosis present

## 2021-04-08 DIAGNOSIS — F10939 Alcohol use, unspecified with withdrawal, unspecified: Secondary | ICD-10-CM

## 2021-04-08 DIAGNOSIS — F17210 Nicotine dependence, cigarettes, uncomplicated: Secondary | ICD-10-CM | POA: Diagnosis present

## 2021-04-08 DIAGNOSIS — F10239 Alcohol dependence with withdrawal, unspecified: Secondary | ICD-10-CM | POA: Diagnosis present

## 2021-04-08 DIAGNOSIS — E785 Hyperlipidemia, unspecified: Secondary | ICD-10-CM | POA: Diagnosis present

## 2021-04-08 DIAGNOSIS — F1094 Alcohol use, unspecified with alcohol-induced mood disorder: Secondary | ICD-10-CM

## 2021-04-08 DIAGNOSIS — F1020 Alcohol dependence, uncomplicated: Secondary | ICD-10-CM

## 2021-04-08 LAB — RESP PANEL BY RT-PCR (FLU A&B, COVID) ARPGX2
Influenza A by PCR: NEGATIVE
Influenza B by PCR: NEGATIVE
SARS Coronavirus 2 by RT PCR: NEGATIVE

## 2021-04-08 MED ORDER — THIAMINE HCL 100 MG PO TABS
100.0000 mg | ORAL_TABLET | Freq: Every day | ORAL | Status: DC
  Administered 2021-04-09 – 2021-04-12 (×4): 100 mg via ORAL
  Filled 2021-04-08 (×4): qty 1
  Filled 2021-04-08: qty 7
  Filled 2021-04-08 (×2): qty 1

## 2021-04-08 MED ORDER — CHLORDIAZEPOXIDE HCL 25 MG PO CAPS: 25.0000 mg | ORAL_CAPSULE | Freq: Four times a day (QID) | ORAL | Status: AC | PRN

## 2021-04-08 MED ORDER — ALUM & MAG HYDROXIDE-SIMETH 200-200-20 MG/5ML PO SUSP: 30.0000 mL | ORAL | Status: DC | PRN

## 2021-04-08 MED ORDER — ADULT MULTIVITAMIN W/MINERALS CH
1.0000 | ORAL_TABLET | Freq: Every day | ORAL | Status: DC
  Administered 2021-04-08 – 2021-04-12 (×5): 1 via ORAL
  Filled 2021-04-08 (×6): qty 1
  Filled 2021-04-08: qty 7
  Filled 2021-04-08 (×2): qty 1

## 2021-04-08 MED ORDER — ACETAMINOPHEN 325 MG PO TABS: 650.0000 mg | ORAL_TABLET | Freq: Four times a day (QID) | ORAL | Status: DC | PRN

## 2021-04-08 MED ORDER — HYDROXYZINE HCL 25 MG PO TABS
25.0000 mg | ORAL_TABLET | Freq: Four times a day (QID) | ORAL | Status: AC | PRN
  Administered 2021-04-08 – 2021-04-09 (×2): 25 mg via ORAL
  Filled 2021-04-08 (×2): qty 1

## 2021-04-08 MED ORDER — MAGNESIUM HYDROXIDE 400 MG/5ML PO SUSP: 30.0000 mL | Freq: Every day | ORAL | Status: DC | PRN

## 2021-04-08 MED ORDER — HYDROXYZINE HCL 25 MG PO TABS: 25.0000 mg | ORAL_TABLET | Freq: Three times a day (TID) | ORAL | Status: DC | PRN

## 2021-04-08 MED ORDER — ONDANSETRON 4 MG PO TBDP: 4.0000 mg | ORAL_TABLET | Freq: Four times a day (QID) | ORAL | Status: DC | PRN

## 2021-04-08 MED ORDER — THIAMINE HCL 100 MG/ML IJ SOLN
100.0000 mg | Freq: Once | INTRAMUSCULAR | Status: DC
  Filled 2021-04-08: qty 2

## 2021-04-08 MED ORDER — TOLNAFTATE 1 % EX CREA
TOPICAL_CREAM | Freq: Two times a day (BID) | CUTANEOUS | Status: DC
  Filled 2021-04-08: qty 30

## 2021-04-08 MED ORDER — ESCITALOPRAM OXALATE 10 MG PO TABS
10.0000 mg | ORAL_TABLET | Freq: Every day | ORAL | Status: DC
  Administered 2021-04-08 – 2021-04-09 (×2): 10 mg via ORAL
  Filled 2021-04-08 (×3): qty 1

## 2021-04-08 MED ORDER — ACAMPROSATE CALCIUM 333 MG PO TBEC
666.0000 mg | DELAYED_RELEASE_TABLET | Freq: Two times a day (BID) | ORAL | Status: DC
  Administered 2021-04-08 – 2021-04-12 (×8): 666 mg via ORAL
  Filled 2021-04-08 (×6): qty 2
  Filled 2021-04-08: qty 28
  Filled 2021-04-08 (×2): qty 2
  Filled 2021-04-08: qty 28
  Filled 2021-04-08 (×3): qty 2

## 2021-04-08 MED ORDER — LOPERAMIDE HCL 2 MG PO CAPS: 2.0000 mg | ORAL_CAPSULE | ORAL | Status: AC | PRN

## 2021-04-08 NOTE — ED Notes (Signed)
Patient is asleep.  

## 2021-04-08 NOTE — ED Notes (Signed)
Breakfast tray given. °

## 2021-04-08 NOTE — ED Notes (Signed)
Report given to [facility]. Will call for transport at 1pm

## 2021-04-08 NOTE — BH Assessment (Signed)
TTS assessment completed. Gave clinical report to Vernard Gambles, NP who determined patient meets criteria for inpatient psychiatric treatment. [facility] AC (Linsey, RN), notified of patient's bed need (Mood Disorder Bed). Patient to be reviewed for consideration of bed placement at [facility]. Disposition staff Doylene Canning), Charge nurse Colin Mulders, RN), patient's nurse (Abby, RN) and EDP (Dr. Effie Shy) notified of patient's disposition status. Based on the [place]-SUICIDE SEVERITY RATING SCALE (C-SSRS) patient scored as "High Risk". Therefore, 1:1 sitter precautions are recommended.

## 2021-04-08 NOTE — ED Notes (Signed)
Introduced myself to patient. Patient stated " I am tired", using explicit words. I asked if I could get some blood work, he did not respond. I asked if he was ok, he did not respond. He looked away, sat on the edge of the bed, then got back into the bed and rolled over. Patient will not answer most of my questions.

## 2021-04-08 NOTE — Progress Notes (Signed)
Pt is a 45 y/o [place] male transferred from [facility] where he presented initially for complaint of depression and SI with plan to Shoot himself. Pt does have a history of depression and etoh abuse. Pt presents with fair eye contact, logical & soft speech, flat affect, depressed mood, slow but steady gait and does forwards on interactions. Pt denies endorse passive SI with plan to "crash my car into the wall". Pt states he does have access to fire arm "I'm not planning on using it". Pt verbally contracts for safety. Rates his depression and anxiety both 10/10 with current stressor being "Mostly my drinking. I've been drinking heavily lately. All I do is drink when I'm not at work. I drink a 5th of liquor daily and the people I hang around all drink socially but when we start then I can't just stop myself". Pt works as a Air cabin crew and worries about possible etoh related injuries while on the job "I got lots of good job opportunities and will hate to just waste it all". Reports he's been having difficulty sleeping for months now "I only get 4 hours of sleep at night, except when I really drink heavily and pass out but my appetite has been good though". Per pt his last drink was on 04/07/21 "I drank a "fifth & half of liquor, I just stopped caring. I feel like God is everything away from me slowly but surely". Stated he noticed his depression started "when my father died from cancer in 72. We were really closed. The doctor started me on medicine then but I don't remember what it was. I stopped taking the medicine because I felt better and did not think I needed it anymore". Pt ambulatory to unit with a steady gait.  Skin assessment done and belongings searched per protocol. Pt does have moisture and foot fungus (athlete's foot) between his toes on bilateral feet. Tattoo noted to right arm. Skin is dry and scaly. All items deemed contraband secured in locker. Q 15 minutes safety checks initiated without self  harm gestures or outburst. Unit orientation done, routines discussed, care plan reviewed with pt and all admission documents signed. Emotional support and encouragement offered to pt. Lunch tray and fluids offered, was tolerated well. Pt is safe on unit and denies concerns at this time.

## 2021-04-08 NOTE — H&P (Signed)
Psychiatric Admission Assessment Adult  Patient Identification: Aaron [name] MRN:  [id number] Date of Evaluation:  04/08/2021 Chief Complaint:  MDD (major depressive disorder), recurrent, severe, with psychosis (HCC) [F33.3] Principal Diagnosis: Alcohol withdrawal without perceptual disturbances (HCC) Diagnosis:  Principal Problem:   Alcohol withdrawal without perceptual disturbances (HCC) Active Problems:   Alcohol dependence (HCC)   Substance induced mood disorder (HCC)  History of Present Illness: Patient is seen and examined.  Patient is a 45 year old male with a past psychiatric history significant for alcohol dependence, substance-induced mood disorder versus major depression and alcohol withdrawal who presented to the [facility] emergency department on 04/07/2021 with depression, suicidal ideation.  He stated that he was drinking heavily, felt like he was living to labs, and have to lied to people about his drinking.  The patient stated he had been at [facility] [place] since approximately December 2021.  He got out of the air approximately 2 weeks ago.  He stated that he had been drinking there "occasionally" but that they did not know he was drinking.  He left there approximately 2 weeks ago and then was drinking much more heavily.  He stated he is currently staying with a friend.  He stated he feels guilty because the fact that he has a girlfriend, and she is unaware of the sick verity of his alcoholism.  He has been in the emergency room multiple times recently.  Review of the electronic medical record revealed 6 emergency room visits at least in the last 4 months.  His last psychiatric hospitalization at our facility was in 2019.  At that time he again was admitted voluntarily due to worsening depression and suicidal ideations.  He admitted to his history of alcohol dependence, and had been drinking more heavily recently prior to that admission.  He was  drinking 2 pints of liquor a day at that time.  His blood alcohol on transfer at that point was 88, and was 189 2 days ago when he presented to the emergency department.  He stated that he had been in rehabilitation programs at least for 5 times.  He could not remember how he times he was admitted to the hospital.  He stated he had 3 or 4 DUIs in the past, but none since he believed in 2013.  He stated that he has been on several medications for alcohol craving, but was unable to remember the names of those.  He was admitted to the hospital for evaluation and stabilization.  Associated Signs/Symptoms: Depression Symptoms:  depressed mood, anhedonia, insomnia, fatigue, feelings of worthlessness/guilt, difficulty concentrating, hopelessness, suicidal thoughts without plan, anxiety, loss of energy/fatigue, disturbed sleep, Duration of Depression Symptoms: No data recorded (Hypo) Manic Symptoms:  Impulsivity, Irritable Mood, Labiality of Mood, Anxiety Symptoms:  Excessive Worry, Psychotic Symptoms:  denied PTSD Symptoms: Negative Total Time spent with patient: 30 minutes  Past Psychiatric History: Patient has had several psychiatric hospitalizations for detox in the past.  His last admission to our facility was in 2020.  He has been in substance rehabilitation programs 4-5 times over the years, and recently was at the [facility] [place] from December 2021 to approximately 2 weeks ago.  He has only had several days in a row of sobriety over the last 6 months.  Is the patient at risk to self? Yes.    Has the patient been a risk to self in the past 6 months? Yes.    Has the patient  been a risk to self within the distant past? Yes.    Is the patient a risk to others? No.  Has the patient been a risk to others in the past 6 months? No.  Has the patient been a risk to others within the distant past? No.   Prior Inpatient Therapy:   Prior Outpatient Therapy:    Alcohol Screening:    Substance Abuse History in the last 12 months:  Yes.   Consequences of Substance Abuse: Medical Consequences:  Primary cause of this admission Previous Psychotropic Medications: Yes  Psychological Evaluations: Yes  Past Medical History:  Past Medical History:  Diagnosis Date  . Depression    No past surgical history on file. Family History:  Family History  Problem Relation Age of Onset  . Cancer Father    Family Psychiatric  History: He stated his father at 1 point in his life abuse cocaine but not alcohol.  He had an uncle that was an alcoholic.  No suicides in his family.  The old record showed that he had a brother with some history of substance issues. Tobacco Screening:   Social History:  Social History   Substance and Sexual Activity  Alcohol Use Yes   Comment: heavy     Social History   Substance and Sexual Activity  Drug Use No    Additional Social History:                           Allergies:  No Known Allergies Lab Results:  Results for orders placed or performed during the hospital encounter of 04/07/21 (from the past 48 hour(s))  Resp Panel by RT-PCR (Flu A&B, Covid) Nasopharyngeal Swab     Status: None   Collection Time: 04/08/21 10:13 AM   Specimen: Nasopharyngeal Swab; Nasopharyngeal(NP) swabs in vial transport medium  Result Value Ref Range   SARS Coronavirus 2 by RT PCR NEGATIVE NEGATIVE    Comment: (NOTE) SARS-CoV-2 target nucleic acids are NOT DETECTED.  The SARS-CoV-2 RNA is generally detectable in upper respiratory specimens during the acute phase of infection. The lowest concentration of SARS-CoV-2 viral copies this assay can detect is 138 copies/mL. A negative result does not preclude SARS-Cov-2 infection and should not be used as the sole basis for treatment or other patient management decisions. A negative result may occur with  improper specimen collection/handling, submission of specimen other than nasopharyngeal swab,  presence of viral mutation(s) within the areas targeted by this assay, and inadequate number of viral copies(<138 copies/mL). A negative result must be combined with clinical observations, patient history, and epidemiological information. The expected result is Negative.  Fact Sheet for Patients:  BloggerCourse.comhttps://www.fda.gov/media/152166/download  Fact Sheet for Healthcare Providers:  SeriousBroker.ithttps://www.fda.gov/media/152162/download  This test is no t yet approved or cleared by the [place] FDA and  has been authorized for detection and/or diagnosis of SARS-CoV-2 by FDA under an Emergency Use Authorization (EUA). This EUA will remain  in effect (meaning this test can be used) for the duration of the COVID-19 declaration under Section 564(b)(1) of the Act, 21 U.S.C.section 360bbb-3(b)(1), unless the authorization is terminated  or revoked sooner.       Influenza A by PCR NEGATIVE NEGATIVE   Influenza B by PCR NEGATIVE NEGATIVE    Comment: (NOTE) The Xpert Xpress SARS-CoV-2/FLU/RSV plus assay is intended as an aid in the diagnosis of influenza from Nasopharyngeal swab specimens and should not be used as a sole basis for  treatment. Nasal washings and aspirates are unacceptable for Xpert Xpress SARS-CoV-2/FLU/RSV testing.  Fact Sheet for Patients: BloggerCourse.com  Fact Sheet for Healthcare Providers: SeriousBroker.it  This test is not yet approved or cleared by the [place] FDA and has been authorized for detection and/or diagnosis of SARS-CoV-2 by FDA under an Emergency Use Authorization (EUA). This EUA will remain in effect (meaning this test can be used) for the duration of the COVID-19 declaration under Section 564(b)(1) of the Act, 21 U.S.C. section 360bbb-3(b)(1), unless the authorization is terminated or revoked.  Performed at [facility], [address], [place], [place] [address]     Blood Alcohol  level:  Lab Results  Component Value Date   ETH 189 (H) 04/06/2021   ETH 88 (H) 12/14/2017    Metabolic Disorder Labs:  No results found for: HGBA1C, MPG No results found for: PROLACTIN No results found for: CHOL, TRIG, HDL, CHOLHDL, VLDL, LDLCALC  Current Medications: Current Facility-Administered Medications  Medication Dose Route Frequency Provider Last Rate Last Admin  . acamprosate (CAMPRAL) tablet 666 mg  666 mg Oral BID WC Antonieta Pert, MD      . acetaminophen (TYLENOL) tablet 650 mg  650 mg Oral Q6H PRN Antonieta Pert, MD      . alum & mag hydroxide-simeth (MAALOX/MYLANTA) 200-200-20 MG/5ML suspension 30 mL  30 mL Oral Q4H PRN Antonieta Pert, MD      . chlordiazePOXIDE (LIBRIUM) capsule 25 mg  25 mg Oral Q6H PRN Antonieta Pert, MD      . escitalopram (LEXAPRO) tablet 10 mg  10 mg Oral Daily Antonieta Pert, MD      . hydrOXYzine (ATARAX/VISTARIL) tablet 25 mg  25 mg Oral Q6H PRN Antonieta Pert, MD      . loperamide (IMODIUM) capsule 2-4 mg  2-4 mg Oral PRN Antonieta Pert, MD      . magnesium hydroxide (MILK OF MAGNESIA) suspension 30 mL  30 mL Oral Daily PRN Antonieta Pert, MD      . multivitamin with minerals tablet 1 tablet  1 tablet Oral Daily Antonieta Pert, MD      . ondansetron (ZOFRAN-ODT) disintegrating tablet 4 mg  4 mg Oral Q6H PRN Antonieta Pert, MD      . thiamine (B-1) injection 100 mg  100 mg Intramuscular Once Antonieta Pert, MD      . Melene Muller ON 04/09/2021] thiamine tablet 100 mg  100 mg Oral Daily Antonieta Pert, MD       PTA Medications: Medications Prior to Admission  Medication Sig Dispense Refill Last Dose  . dicyclomine (BENTYL) 20 MG tablet Take 1 tablet (20 mg total) by mouth 2 (two) times daily. 20 tablet 0   . escitalopram (LEXAPRO) 10 MG tablet Take 1 tablet (10 mg total) by mouth daily. For Mood control (Patient not taking: No sig reported) 30 tablet 0   . ondansetron (ZOFRAN ODT) 8 MG disintegrating  tablet 8mg  ODT q4 hours prn nausea (Patient not taking: No sig reported) 6 tablet 0   . ondansetron (ZOFRAN) 4 MG tablet Take 1 tablet (4 mg total) by mouth every 6 (six) hours. (Patient not taking: No sig reported) 12 tablet 0   . traZODone (DESYREL) 100 MG tablet Take 1 tablet (100 mg total) by mouth at bedtime as needed for sleep. (Patient not taking: No sig reported) 30 tablet 0     Musculoskeletal: Strength & Muscle Tone: within normal  limits Gait & Station: normal Patient leans: N/A            Psychiatric Specialty Exam:  Presentation  General Appearance: Disheveled  Eye Contact:Fair  Speech:Normal Rate  Speech Volume:Decreased  Handedness:Right   Mood and Affect  Mood:Depressed  Affect:Appropriate   Thought Process  Thought Processes:Coherent  Duration of Psychotic Symptoms: No data recorded Past Diagnosis of Schizophrenia or Psychoactive disorder: No data recorded Descriptions of Associations:Intact  Orientation:Full (Time, Place and Person)  Thought Content:Logical  Hallucinations:Hallucinations: None  Ideas of Reference:None  Suicidal Thoughts:Suicidal Thoughts: Yes, Passive SI Passive Intent and/or Plan: Without Intent  Homicidal Thoughts:Homicidal Thoughts: No   Sensorium  Memory:Immediate Fair; Recent Fair; Remote Fair  Judgment:Fair  Insight:Fair   Executive Functions  Concentration:Fair  Attention Span:Fair  Recall:Fair  Fund of Knowledge:Fair  Language:Fair   Psychomotor Activity  Psychomotor Activity:Psychomotor Activity: Decreased   Assets  Assets:Desire for Improvement; Resilience   Sleep  Sleep:Sleep: Fair    Physical Exam: Physical Exam Vitals and nursing note reviewed.  Constitutional:      Appearance: Normal appearance.  HENT:     Head: Normocephalic and atraumatic.  Pulmonary:     Effort: Pulmonary effort is normal.  Neurological:     General: No focal deficit present.     Mental Status:  He is alert and oriented to person, place, and time.    Review of Systems  Gastrointestinal: Positive for abdominal pain.  All other systems reviewed and are negative.  Blood pressure 110/76, pulse 78, temperature 98 F (36.7 C), temperature source Oral, resp. rate 18, SpO2 99 %. There is no height or weight on file to calculate BMI.  Treatment Plan Summary: Daily contact with patient to assess and evaluate symptoms and progress in treatment, Medication management and Plan : Patient is seen and examined.  Patient is a 45 year old male with the above-stated past psychiatric history who was admitted secondary to depression, suicidal thinking as well as alcohol dependence.  He will be admitted to the hospital.  He will be integrated in the milieu.  He will be encouraged to attend groups.  He will be placed on Librium 25 mg p.o. every 6 hours as needed withdrawal scales of greater than 10.  He will also have available hydroxyzine for anxiety, Imodium for diarrhea, and Zofran for nausea.  He also receive thiamine and a multivitamin.  He was previously treated with Lexapro, and we will restart that at 10 mg a day.  I will also start a Campral titration at 666 mg p.o. twice daily and titrate that.  Review of his admission laboratories revealed a mildly low potassium at 3.4, and a normal creatinine at 0.77.  AST was 32 and ALT was 49.  His CBC was normal and platelets were normal at 318,000.  Differential was normal.  His respiratory panel for influenza A, B and coronavirus were negative.  His urinalysis was abnormal, and had the presence of blood and leukocytes.  There were no bacteria seen, and it was a contaminated sample with 6-10 epithelial cells.  Blood alcohol was 189 as per above.  Drug screen was negative.  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  He had a renal stone study done on 5/12 secondary  to the abdominal pain that showed no acute abnormality.  No urinary tract calculi.  Pancreas was noted to be unremarkable and the liver showed  no major findings.  His vital signs are stable, he is afebrile.  His pulse oximetry on room air was 99%.  Observation Level/Precautions:  Detox 15 minute checks  Laboratory:  Chemistry Profile  Psychotherapy:    Medications:    Consultations:    Discharge Concerns:    Estimated LOS:  Other:     Physician Treatment Plan for Primary Diagnosis: Alcohol withdrawal without perceptual disturbances (HCC) Long Term Goal(s): Improvement in symptoms so as ready for discharge  Short Term Goals: Ability to identify changes in lifestyle to reduce recurrence of condition will improve, Ability to verbalize feelings will improve, Ability to disclose and discuss suicidal ideas, Ability to demonstrate self-control will improve, Ability to identify and develop effective coping behaviors will improve, Ability to maintain clinical measurements within normal limits will improve and Ability to identify triggers associated with substance abuse/mental health issues will improve  Physician Treatment Plan for Secondary Diagnosis: Principal Problem:   Alcohol withdrawal without perceptual disturbances (HCC) Active Problems:   Alcohol dependence (HCC)   Substance induced mood disorder (HCC)  Long Term Goal(s): Improvement in symptoms so as ready for discharge  Short Term Goals: Ability to identify changes in lifestyle to reduce recurrence of condition will improve, Ability to verbalize feelings will improve, Ability to disclose and discuss suicidal ideas, Ability to demonstrate self-control will improve, Ability to identify and develop effective coping behaviors will improve, Ability to maintain clinical measurements within normal limits will improve and Ability to identify triggers associated with substance abuse/mental health issues will improve  I certify that inpatient  services furnished can reasonably be expected to improve the patient's condition.    Antonieta Pert, MD 5/19/20223:35 PM

## 2021-04-08 NOTE — ED Notes (Signed)
Pt changed out into burgundy scrubs, all belongings including clothes shoes wallet hat phone keys located in pt bag, pt also has yellow and black duffle bag all items located in cabinet behind nurses station 9-12. wanded by security.

## 2021-04-08 NOTE — BH Assessment (Signed)
BHH Assessment Progress Note  Per Vernard Gambles, NP, this pt requires psychiatric hospitalization at this time.  Linsey, RN, [facility] has assigned pt to [facility] Rm 502-2 to the service of Dr Fayrene Fearing.   EDP Alvester Chou, MD and pt's nurse, Abby, have been notified  Abby agrees to have pt sign Consent for Admission, to send original paperwork along with pt via [employer], and to call report to [phone].  Doylene Canning, [place] Behavioral Health Coordinator [phone]

## 2021-04-08 NOTE — BH Assessment (Signed)
TTS requested cart to be placed in patient's room. Unable to dial cart. Attempted several times. Notified nursing.

## 2021-04-08 NOTE — Tx Team (Signed)
Initial Treatment Plan 04/08/2021 7:50 PM Nelida Meuse III [id number]    PATIENT STRESSORS: Marital or family conflict Medication change or noncompliance Occupational concerns Substance abuse   PATIENT STRENGTHS: Capable of independent living Financial means Physical Health Religious Affiliation Supportive family/friends Work skills   PATIENT IDENTIFIED PROBLEMS: Alterations in mood (Depression & anxiety) "I've been really depressed and just stopped caring about everything"    Alterations in sleep pattern "I've been sleeping for only 4 hours per night except when I really drink heavily on some nights and pass out".    Substance Abuse "I have been drinking really heavily lately"             DISCHARGE CRITERIA:  Improved stabilization in mood, thinking, and/or behavior Verbal commitment to aftercare and medication compliance  PRELIMINARY DISCHARGE PLAN: Outpatient therapy Return to previous living arrangement Return to previous work or school arrangements  PATIENT/FAMILY INVOLVEMENT: This treatment plan has been presented to and reviewed with the patient, Fox Salminen III. The patient have been given the opportunity to ask questions and make suggestions.  Sherryl Manges, RN 04/08/2021, 7:50 PM

## 2021-04-08 NOTE — BHH Suicide Risk Assessment (Signed)
[facility] Admission Suicide Risk Assessment   Nursing information obtained from:    Demographic factors:    Current Mental Status:    Loss Factors:    Historical Factors:    Risk Reduction Factors:     Total Time spent with patient: 30 minutes Principal Problem: Alcohol withdrawal without perceptual disturbances (HCC) Diagnosis:  Principal Problem:   Alcohol withdrawal without perceptual disturbances (HCC) Active Problems:   Alcohol dependence (HCC)   Substance induced mood disorder (HCC)  Subjective Data: Patient is seen and examined.  Patient is a 45 year old male with a past psychiatric history significant for alcohol dependence, substance-induced mood disorder versus major depression and alcohol withdrawal who presented to the [facility] emergency department on 04/07/2021 with depression, suicidal ideation.  He stated that he was drinking heavily, felt like he was living to labs, and have to lied to people about his drinking.  The patient stated he had been at [facility] [place] since approximately December 2021.  He got out of the air approximately 2 weeks ago.  He stated that he had been drinking there "occasionally" but that they did not know he was drinking.  He left there approximately 2 weeks ago and then was drinking much more heavily.  He stated he is currently staying with a friend.  He stated he feels guilty because the fact that he has a girlfriend, and she is unaware of the sick verity of his alcoholism.  He has been in the emergency room multiple times recently.  Review of the electronic medical record revealed 6 emergency room visits at least in the last 4 months.  His last psychiatric hospitalization at our facility was in 2019.  At that time he again was admitted voluntarily due to worsening depression and suicidal ideations.  He admitted to his history of alcohol dependence, and had been drinking more heavily recently prior to that admission.  He was drinking  2 pints of liquor a day at that time.  His blood alcohol on transfer at that point was 88, and was 189 2 days ago when he presented to the emergency department.  He stated that he had been in rehabilitation programs at least for 5 times.  He could not remember how he times he was admitted to the hospital.  He stated he had 3 or 4 DUIs in the past, but none since he believed in 2013.  He stated that he has been on several medications for alcohol craving, but was unable to remember the names of those.  He was admitted to the hospital for evaluation and stabilization.  Continued Clinical Symptoms:    The "Alcohol Use Disorders Identification Test", Guidelines for Use in Primary Care, Second Edition.  World Science writer [facility]). Score between 0-7:  no or low risk or alcohol related problems. Score between 8-15:  moderate risk of alcohol related problems. Score between 16-19:  high risk of alcohol related problems. Score 20 or above:  warrants further diagnostic evaluation for alcohol dependence and treatment.   CLINICAL FACTORS:   Depression:   Anhedonia Comorbid alcohol abuse/dependence Hopelessness Impulsivity Insomnia Alcohol/Substance Abuse/Dependencies   Musculoskeletal: Strength & Muscle Tone: within normal limits Gait & Station: normal Patient leans: N/A  Psychiatric Specialty Exam:  Presentation  General Appearance: Disheveled  Eye Contact:Fair  Speech:Normal Rate  Speech Volume:Decreased  Handedness:Right   Mood and Affect  Mood:Depressed  Affect:Appropriate   Thought Process  Thought Processes:Coherent  Descriptions of Associations:Intact  Orientation:Full (Time, Place and  Person)  Thought Content:Logical  History of Schizophrenia/Schizoaffective disorder:No data recorded Duration of Psychotic Symptoms:No data recorded Hallucinations:Hallucinations: None  Ideas of Reference:None  Suicidal Thoughts:Suicidal Thoughts: Yes, Passive SI Passive  Intent and/or Plan: Without Intent  Homicidal Thoughts:Homicidal Thoughts: No   Sensorium  Memory:Immediate Fair; Recent Fair; Remote Fair  Judgment:Fair  Insight:Fair   Executive Functions  Concentration:Fair  Attention Span:Fair  Recall:Fair  Fund of Knowledge:Fair  Language:Fair   Psychomotor Activity  Psychomotor Activity:Psychomotor Activity: Decreased   Assets  Assets:Desire for Improvement; Resilience   Sleep  Sleep:Sleep: Fair    Physical Exam: Physical Exam Vitals and nursing note reviewed.  Constitutional:      Appearance: Normal appearance.  HENT:     Head: Normocephalic and atraumatic.  Pulmonary:     Effort: Pulmonary effort is normal.  Neurological:     General: No focal deficit present.     Mental Status: He is alert and oriented to person, place, and time.    ROS Blood pressure 110/76, pulse 78, temperature 98 F (36.7 C), temperature source Oral, resp. rate 18, SpO2 99 %. There is no height or weight on file to calculate BMI.   COGNITIVE FEATURES THAT CONTRIBUTE TO RISK:  None    SUICIDE RISK:   Mild:  Suicidal ideation of limited frequency, intensity, duration, and specificity.  There are no identifiable plans, no associated intent, mild dysphoria and related symptoms, good self-control (both objective and subjective assessment), few other risk factors, and identifiable protective factors, including available and accessible social support.  PLAN OF CARE: Patient is seen and examined.  Patient is a 45 year old male with the above-stated past psychiatric history who was admitted secondary to depression, suicidal thinking as well as alcohol dependence.  He will be admitted to the hospital.  He will be integrated in the milieu.  He will be encouraged to attend groups.  He will be placed on Librium 25 mg p.o. every 6 hours as needed withdrawal scales of greater than 10.  He will also have available hydroxyzine for anxiety, Imodium for  diarrhea, and Zofran for nausea.  He also receive thiamine and a multivitamin.  He was previously treated with Lexapro, and we will restart that at 10 mg a day.  I will also start a Campral titration at 666 mg p.o. twice daily and titrate that.  Review of his admission laboratories revealed a mildly low potassium at 3.4, and a normal creatinine at 0.77.  AST was 32 and ALT was 49.  His CBC was normal and platelets were normal at 318,000.  Differential was normal.  His respiratory panel for influenza A, B and coronavirus were negative.  His urinalysis was abnormal, and had the presence of blood and leukocytes.  There were no bacteria seen, and it was a contaminated sample with 6-10 epithelial cells.  Blood alcohol was 189 as per above.  Drug screen was negative.  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  He had a renal stone study done on 5/12 secondary to the abdominal pain that showed no acute abnormality.  No urinary tract calculi.  Pancreas was noted to be unremarkable and the liver showed no major findings.  His vital signs are stable, he is afebrile.  His pulse oximetry on room air was 99%.  I certify that inpatient services furnished can reasonably be expected to improve the patient's condition.   Marlane Mingle  Jola Babinski, MD 04/08/2021, 3:23 PM

## 2021-04-09 MED ORDER — ESCITALOPRAM OXALATE 20 MG PO TABS
20.0000 mg | ORAL_TABLET | Freq: Every day | ORAL | Status: DC
  Administered 2021-04-10 – 2021-04-12 (×3): 20 mg via ORAL
  Filled 2021-04-09 (×2): qty 1
  Filled 2021-04-09: qty 7
  Filled 2021-04-09 (×2): qty 1

## 2021-04-09 MED ORDER — ONDANSETRON 4 MG PO TBDP: 8.0000 mg | ORAL_TABLET | Freq: Four times a day (QID) | ORAL | Status: AC | PRN

## 2021-04-09 MED ORDER — PANTOPRAZOLE SODIUM 40 MG PO TBEC
40.0000 mg | DELAYED_RELEASE_TABLET | Freq: Every day | ORAL | Status: DC
  Administered 2021-04-09 – 2021-04-12 (×4): 40 mg via ORAL
  Filled 2021-04-09: qty 7
  Filled 2021-04-09 (×6): qty 1

## 2021-04-09 NOTE — Progress Notes (Signed)
D:  Patient's self inventory sheet, patient has fair sleep, sleep medication helpful.  Fair appetite, low energy level, poor concentration.  Rated depression and hopeless 9, anxiety 7.  Withdrawals, agitation.  Denied SI.  Denied physical problems.  Denied physical pain.  Goal is stop thinking about things making me more depressed.  No discharge plans. A:  Medications administered per MD orders.  Emotional support and encouragement given patient. R:  Denied SI and HI, contracts for safety.  Denied A/V hallucinations.  Safety maintained with 15 minute checks.

## 2021-04-09 NOTE — BHH Group Notes (Signed)
Type of Therapy and Topic: Group Therapy: Overcoming Obstacles  Participation Level: Did Not Attend  Description of Group:  In this group patients will watch a Ted Talk titled, "Why We Should All Try Therapy". In this video, patients explore the benefits of going to therapy and how this choice may effect their life. This video also explores other emotions that may arise by attending therapy. Patients were also given a worksheet titled "Therapy Goals" that allows patients to explore their goals for therapy and how their life may be different once they have completed therapy.    Therapeutic Goals:  1. Patient will identify personal and current emotions as they relate to therapy.  2. Patient will identify barriers that currently interfere with them beginning therapy.  3. Patient will identify two goals they are willing to set for therapy.     Summary of Patient Progress: Did Not Attend  Refoel Palladino, LCSWA Clinicial Social Worker [facility] 

## 2021-04-09 NOTE — Progress Notes (Signed)
   04/09/21 0100  Psych Admission Type (Psych Patients Only)  Admission Status Voluntary  Psychosocial Assessment  Patient Complaints Anxiety  Eye Contact Fair  Facial Expression Flat;Worried  Affect Appropriate to circumstance  Speech Logical/coherent;Soft  Interaction Assertive  Motor Activity Slow  Appearance/Hygiene Unremarkable;In scrubs  Aggressive Behavior  Targets Self  Type of Behavior Verbal (Thoughts of crashing my car into the wall)  Effect No apparent injury  Thought Process  Coherency WDL  Content Blaming self  Delusions None reported or observed  Perception WDL  Hallucination None reported or observed  Judgment Limited  Confusion None  Danger to Self  Current suicidal ideation? Denies  Danger to Others  Danger to Others None reported or observed   Patient c/o anxiety 5/10 at hs Prn vistaril 25mg  PO given and reported effective (see Mar) Patient denies SI/HI/A/VH support and encouragement provided as needed.

## 2021-04-09 NOTE — Progress Notes (Signed)
Recreation Therapy Notes  Date: 04/09/2021 Time: 900a  Topic: Stress Management   Goal Area(s) Addresses:  Patient will review and complete packet supporting identification of stressors and and techniques to combat compounding stress.   Intervention: Independent Workbook   Education: Stress, Time Management, Coping Skills, Lifestyle Changes, Discharge Planning   Comments: LRT provided pt a workbook reviewing stress management concepts and offering an opportunity to create a plan to address stressors. Pt given the option to complete the packet in the dayroom with RN/MHT staff and peers or at their own pace in their room.   Jannis Atkins, LRT/CTRS 

## 2021-04-09 NOTE — BHH Group Notes (Signed)
Pt did not attend morning goals group. 

## 2021-04-09 NOTE — Progress Notes (Signed)
[facility]  04/09/2021 11:45 AM  Nelida [name] Austin Lipsky III  MRN:  [id number] Subjective: Patient is a 45 year old male with a past psychiatric history significant for alcohol dependence, substance-induced mood disorder versus major depression as well as alcohol withdrawal who [facility] with depression, suicidal ideation and requesting detox.  Objective: Patient is seen and examined.  Patient is a 45 year old male with the above-stated past psychiatric history was seen in follow-up.  He is basically the same as yesterday.  Perhaps a little worse.  This is most likely secondary to alcohol withdrawal.  Although his vital signs are stable and he is afebrile, he complained of significant nausea and abdominal pain.  He is still depressed.  He stated he just wants to get better.  He stated his suicidal thoughts are slightly decreased.  His blood pressure this morning was 117/88 and repeat was 120/93.  He is afebrile.  Pulse was stable at 80.  Pulse oximetry was at 98% on room air.  Nursing notes reflect he slept 6.75 hours last night.  Respiratory panel from 5/19 was negative for influenza A, B as well as coronavirus.  Principal Problem: Alcohol withdrawal without perceptual disturbances (HCC) Diagnosis: Principal Problem:   Alcohol withdrawal without perceptual disturbances (HCC) Active Problems:   Alcohol dependence (HCC)   Substance induced mood disorder (HCC)  Total Time spent with patient: 20 minutes  Past Psychiatric History: See admission H&P  Past Medical History:  Past Medical History:  Diagnosis Date  . Depression    History reviewed. No pertinent surgical history. Family History:  Family History  Problem Relation Age of Onset  . Cancer Father    Family Psychiatric  History: See admission H&P Social History:  Social History   Substance and Sexual Activity  Alcohol Use Yes   Comment: heavy      Social History   Substance and Sexual Activity  Drug Use No    Social History   Socioeconomic History  . Marital status: Single    Spouse name: Not on file  . Number of children: Not on file  . Years of education: Not on file  . Highest education level: Not on file  Occupational History  . Not on file  Tobacco Use  . Smoking status: Current Every Day Smoker    Packs/day: 0.25    Types: Cigarettes  . Smokeless tobacco: Never Used  Vaping Use  . Vaping Use: Never used  Substance and Sexual Activity  . Alcohol use: Yes    Comment: heavy  . Drug use: No  . Sexual activity: Not on file  Other Topics Concern  . Not on file  Social History Narrative  . Not on file   Social Determinants of Health   Financial Resource Strain: Not on file  Food Insecurity: Not on file  Transportation Needs: Not on file  Physical Activity: Not on file  Stress: Not on file  Social Connections: Not on file   Additional Social History:                         Sleep: Good  Appetite:  Fair  Current Medications: Current Facility-Administered Medications  Medication Dose Route Frequency Provider Last Rate Last Admin  . acamprosate (CAMPRAL) tablet 666 mg  666 mg Oral BID WC Antonieta Pertlary, Suriyah Vergara Lawson, MD   666 mg at 04/09/21 0830  . acetaminophen (TYLENOL) tablet 650 mg  650  mg Oral Q6H PRN Antonieta Pert, MD      . alum & mag hydroxide-simeth (MAALOX/MYLANTA) 200-200-20 MG/5ML suspension 30 mL  30 mL Oral Q4H PRN Antonieta Pert, MD      . chlordiazePOXIDE (LIBRIUM) capsule 25 mg  25 mg Oral Q6H PRN Antonieta Pert, MD      . Melene Muller ON 04/10/2021] escitalopram (LEXAPRO) tablet 20 mg  20 mg Oral Daily Antonieta Pert, MD      . hydrOXYzine (ATARAX/VISTARIL) tablet 25 mg  25 mg Oral Q6H PRN Antonieta Pert, MD   25 mg at 04/08/21 2132  . loperamide (IMODIUM) capsule 2-4 mg  2-4 mg Oral PRN Antonieta Pert, MD      . magnesium hydroxide (MILK OF MAGNESIA) suspension 30 mL   30 mL Oral Daily PRN Antonieta Pert, MD      . multivitamin with minerals tablet 1 tablet  1 tablet Oral Daily Antonieta Pert, MD   1 tablet at 04/09/21 0831  . ondansetron (ZOFRAN-ODT) disintegrating tablet 8 mg  8 mg Oral Q6H PRN Antonieta Pert, MD      . pantoprazole (PROTONIX) EC tablet 40 mg  40 mg Oral Daily Antonieta Pert, MD      . thiamine (B-1) injection 100 mg  100 mg Intramuscular Once Antonieta Pert, MD      . thiamine tablet 100 mg  100 mg Oral Daily Antonieta Pert, MD   100 mg at 04/09/21 0831  . tolnaftate (TINACTIN) 1 % cream   Topical BID Antonieta Pert, MD   Given at 04/09/21 0900    Lab Results:  Results for orders placed or performed during the hospital encounter of 04/07/21 (from the past 48 hour(s))  Resp Panel by RT-PCR (Flu A&B, Covid) Nasopharyngeal Swab     Status: None   Collection Time: 04/08/21 10:13 AM   Specimen: Nasopharyngeal Swab; Nasopharyngeal(NP) swabs in vial transport medium  Result Value Ref Range   SARS Coronavirus 2 by RT PCR NEGATIVE NEGATIVE    Comment: (NOTE) SARS-CoV-2 target nucleic acids are NOT DETECTED.  The SARS-CoV-2 RNA is generally detectable in upper respiratory specimens during the acute phase of infection. The lowest concentration of SARS-CoV-2 viral copies this assay can detect is 138 copies/mL. A negative result does not preclude SARS-Cov-2 infection and should not be used as the sole basis for treatment or other patient management decisions. A negative result may occur with  improper specimen collection/handling, submission of specimen other than nasopharyngeal swab, presence of viral mutation(s) within the areas targeted by this assay, and inadequate number of viral copies(<138 copies/mL). A negative result must be combined with clinical observations, patient history, and epidemiological information. The expected result is Negative.  Fact Sheet for Patients:   BloggerCourse.com  Fact Sheet for Healthcare Providers:  SeriousBroker.it  This test is no t yet approved or cleared by the [place] FDA and  has been authorized for detection and/or diagnosis of SARS-CoV-2 by FDA under an Emergency Use Authorization (EUA). This EUA will remain  in effect (meaning this test can be used) for the duration of the COVID-19 declaration under Section 564(b)(1) of the Act, 21 U.S.C.section 360bbb-3(b)(1), unless the authorization is terminated  or revoked sooner.       Influenza A by PCR NEGATIVE NEGATIVE   Influenza B by PCR NEGATIVE NEGATIVE    Comment: (NOTE) The Xpert Xpress SARS-CoV-2/FLU/RSV plus assay is intended as an aid in the  diagnosis of influenza from Nasopharyngeal swab specimens and should not be used as a sole basis for treatment. Nasal washings and aspirates are unacceptable for Xpert Xpress SARS-CoV-2/FLU/RSV testing.  Fact Sheet for Patients: BloggerCourse.com  Fact Sheet for Healthcare Providers: SeriousBroker.it  This test is not yet approved or cleared by the [place] FDA and has been authorized for detection and/or diagnosis of SARS-CoV-2 by FDA under an Emergency Use Authorization (EUA). This EUA will remain in effect (meaning this test can be used) for the duration of the COVID-19 declaration under Section 564(b)(1) of the Act, 21 U.S.C. section 360bbb-3(b)(1), unless the authorization is terminated or revoked.  Performed at [facility], [address], [place], [place] [address]     Blood Alcohol level:  Lab Results  Component Value Date   ETH 189 (H) 04/06/2021   ETH 88 (H) 12/14/2017    Metabolic Disorder Labs: No results found for: HGBA1C, MPG No results found for: PROLACTIN No results found for: CHOL, TRIG, HDL, CHOLHDL, VLDL, LDLCALC  Physical Findings: AIMS: Facial and Oral  Movements Muscles of Facial Expression: None, normal Lips and Perioral Area: None, normal Jaw: None, normal Tongue: None, normal,Extremity Movements Upper (arms, wrists, hands, fingers): None, normal Lower (legs, knees, ankles, toes): None, normal, Trunk Movements Neck, shoulders, hips: None, normal, Overall Severity Severity of abnormal movements (highest score from questions above): None, normal Incapacitation due to abnormal movements: None, normal Patient's awareness of abnormal movements (rate only patient's report): No Awareness, Dental Status Current problems with teeth and/or dentures?: No Does patient usually wear dentures?: No  CIWA:  CIWA-Ar Total: 2 COWS:     Musculoskeletal: Strength & Muscle Tone: within normal limits Gait & Station: normal Patient leans: N/A  Psychiatric Specialty Exam:  Presentation  General Appearance: Disheveled  Eye Contact:Minimal  Speech:Normal Rate  Speech Volume:Decreased  Handedness:Right   Mood and Affect  Mood:Depressed  Affect:Blunt   Thought Process  Thought Processes:Coherent  Descriptions of Associations:Intact  Orientation:Full (Time, Place and Person)  Thought Content:Logical  History of Schizophrenia/Schizoaffective disorder:No data recorded Duration of Psychotic Symptoms:No data recorded Hallucinations:Hallucinations: None  Ideas of Reference:None  Suicidal Thoughts:Suicidal Thoughts: Yes, Passive SI Passive Intent and/or Plan: Without Intent  Homicidal Thoughts:Homicidal Thoughts: No   Sensorium  Memory:Immediate Fair; Recent Fair; Remote Fair  Judgment:Fair  Insight:Fair   Executive Functions  Concentration:Fair  Attention Span:Fair  Recall:Fair  Fund of Knowledge:Fair  Language:Fair   Psychomotor Activity  Psychomotor Activity:Psychomotor Activity: Decreased   Assets  Assets:Desire for Improvement; Resilience   Sleep  Sleep:Sleep: Good Number of Hours of Sleep:  6.75    Physical Exam: Physical Exam Vitals and nursing note reviewed.  HENT:     Head: Normocephalic and atraumatic.  Pulmonary:     Effort: Pulmonary effort is normal.  Neurological:     General: No focal deficit present.     Mental Status: He is alert and oriented to person, place, and time.    Review of Systems  Gastrointestinal: Positive for nausea.  Psychiatric/Behavioral: Positive for depression and substance abuse. The patient is nervous/anxious and has insomnia.   All other systems reviewed and are negative.  Blood pressure (!) 120/93, pulse 80, temperature 98.7 F (37.1 C), temperature source Oral, resp. rate 18, height 5\' 1"  (1.549 m), weight 73.5 kg, SpO2 98 %. Body mass index is 30.61 kg/m.   Treatment Plan Summary: Daily contact with patient to assess and evaluate symptoms and progress in treatment, Medication management and Plan : Patient  is seen and examined.  Patient is a 45 year old male with the above-stated past psychiatric history who is seen in follow-up.   Diagnosis: 1.  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  Alcohol withdrawal. 3.  Alcohol dependence. 4.  Hyperlipidemia  Pertinent findings on examination today: 1.  Perhaps slightly worse than yesterday, most likely secondary to alcohol withdrawal syndrome.  Plan: 1.  Continue acamprosate 666 mg p.o. twice daily with food to decrease alcohol cravings. 2.  Continue Librium 25 mg p.o. every 6 hours as needed a CIWA greater than 10. 3.  Increase Lexapro to 20 mg p.o. daily for depression and anxiety. 4.  Continue hydroxyzine 25 mg p.o. every 6 hours as needed anxiety. 5.  Continue Imodium 2 to 4 mg p.o. as needed for diarrhea or loose stool. 6.  Add Protonix 40 mg p.o. daily for gastric protection. 7.  Continue Tinactin 1% cream on athlete's foot treatment bilaterally. 8.  Increase Zofran to 8 mg p.o. every 8 hours as needed nausea and  vomiting. 9.  Disposition planning-in progress. Antonieta Pert, MD 04/09/2021, 11:45 AM

## 2021-04-09 NOTE — Plan of Care (Signed)
Nurse discussed coping skills with patient.  

## 2021-04-09 NOTE — Tx Team (Signed)
Interdisciplinary Treatment and Diagnostic Plan Update  04/09/2021 Time of Session:  Aaron [name] MRN: [id number]  Principal Diagnosis: Alcohol withdrawal without perceptual disturbances [facility])  Secondary Diagnoses: Principal Problem:   Alcohol withdrawal without perceptual disturbances ([place]) Active Problems:   Alcohol dependence ([place])   Substance induced mood disorder ([place])   Current Medications:  Current Facility-Administered Medications  Medication Dose Route Frequency Provider Last Rate Last Admin  . acamprosate (CAMPRAL) tablet 666 mg  666 mg Oral BID WC Sharma Covert, MD   666 mg at 04/09/21 0830  . acetaminophen (TYLENOL) tablet 650 mg  650 mg Oral Q6H PRN Sharma Covert, MD      . alum & mag hydroxide-simeth (MAALOX/MYLANTA) 200-200-20 MG/5ML suspension 30 mL  30 mL Oral Q4H PRN Sharma Covert, MD      . chlordiazePOXIDE (LIBRIUM) capsule 25 mg  25 mg Oral Q6H PRN Sharma Covert, MD      . Derrill Memo ON 04/10/2021] escitalopram (LEXAPRO) tablet 20 mg  20 mg Oral Daily Sharma Covert, MD      . hydrOXYzine (ATARAX/VISTARIL) tablet 25 mg  25 mg Oral Q6H PRN Sharma Covert, MD   25 mg at 04/08/21 2132  . loperamide (IMODIUM) capsule 2-4 mg  2-4 mg Oral PRN Sharma Covert, MD      . magnesium hydroxide (MILK OF MAGNESIA) suspension 30 mL  30 mL Oral Daily PRN Sharma Covert, MD      . multivitamin with minerals tablet 1 tablet  1 tablet Oral Daily Sharma Covert, MD   1 tablet at 04/09/21 0831  . ondansetron (ZOFRAN-ODT) disintegrating tablet 8 mg  8 mg Oral Q6H PRN Sharma Covert, MD      . pantoprazole (PROTONIX) EC tablet 40 mg  40 mg Oral Daily Sharma Covert, MD      . thiamine (B-1) injection 100 mg  100 mg Intramuscular Once Sharma Covert, MD      . thiamine tablet 100 mg  100 mg Oral Daily Sharma Covert, MD   100 mg at 04/09/21 0831  . tolnaftate (TINACTIN) 1 % cream   Topical BID Sharma Covert, MD   Given at  04/09/21 0900   PTA Medications: No medications prior to admission.    Patient Stressors: Marital or family conflict Medication change or noncompliance Occupational concerns Substance abuse  Patient Strengths: Capable of independent living Scientist, research (life sciences) Physical Health Religious Affiliation Supportive family/friends Work skills  Treatment Modalities: Medication Management, Group therapy, Case management,  1 to 1 session with clinician, Psychoeducation, Recreational therapy.   Physician Treatment Plan for Primary Diagnosis: Alcohol withdrawal without perceptual disturbances ([place]) Long Term Goal(s): Improvement in symptoms so as ready for discharge Improvement in symptoms so as ready for discharge   Short Term Goals: Ability to identify changes in lifestyle to reduce recurrence of condition will improve Ability to verbalize feelings will improve Ability to disclose and discuss suicidal ideas Ability to demonstrate self-control will improve Ability to identify and develop effective coping behaviors will improve Ability to maintain clinical measurements within normal limits will improve Ability to identify triggers associated with substance abuse/mental health issues will improve Ability to identify changes in lifestyle to reduce recurrence of condition will improve Ability to verbalize feelings will improve Ability to disclose and discuss suicidal ideas Ability to demonstrate self-control will improve Ability to identify and develop effective coping behaviors will improve Ability to maintain clinical measurements within normal limits  will improve Ability to identify triggers associated with substance abuse/mental health issues will improve  Medication Management: Evaluate patient's response, side effects, and tolerance of medication regimen.  Therapeutic Interventions: 1 to 1 sessions, Unit Group sessions and Medication administration.  Evaluation of Outcomes: Not  Met  Physician Treatment Plan for Secondary Diagnosis: Principal Problem:   Alcohol withdrawal without perceptual disturbances ([place]) Active Problems:   Alcohol dependence ([place])   Substance induced mood disorder ([place])  Long Term Goal(s): Improvement in symptoms so as ready for discharge Improvement in symptoms so as ready for discharge   Short Term Goals: Ability to identify changes in lifestyle to reduce recurrence of condition will improve Ability to verbalize feelings will improve Ability to disclose and discuss suicidal ideas Ability to demonstrate self-control will improve Ability to identify and develop effective coping behaviors will improve Ability to maintain clinical measurements within normal limits will improve Ability to identify triggers associated with substance abuse/mental health issues will improve Ability to identify changes in lifestyle to reduce recurrence of condition will improve Ability to verbalize feelings will improve Ability to disclose and discuss suicidal ideas Ability to demonstrate self-control will improve Ability to identify and develop effective coping behaviors will improve Ability to maintain clinical measurements within normal limits will improve Ability to identify triggers associated with substance abuse/mental health issues will improve     Medication Management: Evaluate patient's response, side effects, and tolerance of medication regimen.  Therapeutic Interventions: 1 to 1 sessions, Unit Group sessions and Medication administration.  Evaluation of Outcomes: Not Met   RN Treatment Plan for Primary Diagnosis: Alcohol withdrawal without perceptual disturbances ([place]) Long Term Goal(s): Knowledge of disease and therapeutic regimen to maintain health will improve  Short Term Goals: Ability to demonstrate self-control, Ability to participate in decision making will improve and Ability to verbalize feelings will improve  Medication Management:  RN will administer medications as ordered by provider, will assess and evaluate patient's response and provide education to patient for prescribed medication. RN will report any adverse and/or side effects to prescribing provider.  Therapeutic Interventions: 1 on 1 counseling sessions, Psychoeducation, Medication administration, Evaluate responses to treatment, Monitor vital signs and CBGs as ordered, Perform/monitor CIWA, COWS, AIMS and Fall Risk screenings as ordered, Perform wound care treatments as ordered.  Evaluation of Outcomes: Not Met   LCSW Treatment Plan for Primary Diagnosis: Alcohol withdrawal without perceptual disturbances ([place]) Long Term Goal(s): Safe transition to appropriate next level of care at discharge, Engage patient in therapeutic group addressing interpersonal concerns.  Short Term Goals: Engage patient in aftercare planning with referrals and resources, Increase social support and Increase ability to appropriately verbalize feelings  Therapeutic Interventions: Assess for all discharge needs, 1 to 1 time with Social worker, Explore available resources and support systems, Assess for adequacy in community support network, Educate family and significant other(s) on suicide prevention, Complete Psychosocial Assessment, Interpersonal group therapy.  Evaluation of Outcomes: Not Met   Progress in Treatment: Attending groups: No. Participating in groups: No. Taking medication as prescribed: Yes. Toleration medication: Yes. Family/Significant other contact made: No, will contact:  CSW will obtain consent Patient understands diagnosis: Yes. Discussing patient identified problems/goals with staff: Yes. Medical problems stabilized or resolved: Yes. Denies suicidal/homicidal ideation: Yes. Issues/concerns per patient self-inventory: No. Other: None  New problem(s) identified: No, Describe:  None  New Short Term/Long Term Goal(s):medication stabilization, elimination of SI  thoughts, development of comprehensive mental wellness plan.  Patient Goals:  "getting my sobriety back."  Discharge  Plan or Barriers: Patient recently admitted. CSW will continue to follow and assess for appropriate referrals and possible discharge planning.  Reason for Continuation of Hospitalization: Depression Medication stabilization Withdrawal symptoms  Estimated Length of Stay: 3-5 days  Attendees: Patient: Aaron [name] 04/09/2021   Physician: Ethelene Browns, MD 04/09/2021   Nursing:  04/09/2021   RN Care Manager: 04/09/2021   Social Worker: Toney Reil, [place] 04/09/2021   Recreational Therapist:  04/09/2021   Other:  04/09/2021   Other:  04/09/2021   Other: 04/09/2021      Scribe for Treatment Team: Mliss Fritz, Latanya Presser 04/09/2021 11:48 AM

## 2021-04-10 DIAGNOSIS — F1024 Alcohol dependence with alcohol-induced mood disorder: Principal | ICD-10-CM

## 2021-04-10 DIAGNOSIS — F10230 Alcohol dependence with withdrawal, uncomplicated: Secondary | ICD-10-CM

## 2021-04-10 NOTE — Progress Notes (Signed)
   04/10/21 0915  Psych Admission Type (Psych Patients Only)  Admission Status Voluntary  Psychosocial Assessment  Patient Complaints Anxiety;Depression  Eye Contact Fair  Facial Expression Flat;Worried  Affect Appropriate to circumstance  Speech Logical/coherent;Soft  Interaction Assertive  Motor Activity Slow  Appearance/Hygiene Unremarkable  Behavior Characteristics Cooperative;Anxious  Mood Anxious;Depressed  Thought Process  Coherency WDL  Content WDL  Delusions None reported or observed  Perception WDL  Hallucination None reported or observed  Judgment Limited  Confusion None  Danger to Self  Current suicidal ideation? Denies  Danger to Others  Danger to Others None reported or observed    Pt denied SI/HI/AVH.  Pt interacting with peers, but did not attend group meetings.  Pt took medications without incident.  Pt endorsed depression, but stated anxiety "was low probably at a 1 out of 10."  RN provided support and assessed for needs and concerns.  Pt remains safe on the unit with q 15 min room checks in place.

## 2021-04-10 NOTE — Progress Notes (Signed)
[facility] Group Notes:  (Nursing/MHT/Case Management/Adjunct)  Date:  04/10/2021  Time:  10:49 PM  Type of Therapy:  Group Therapy  Participation Level:  Active  Participation Quality:  Attentive  Affect:  Appropriate  Cognitive:  Appropriate  Insight:  Improving  Engagement in Group:  Developing/Improving  Modes of Intervention:  Discussion  Summary of Progress/Problems:  Lorita Officer 04/10/2021, 10:49 PM

## 2021-04-10 NOTE — [facility] Group Notes (Signed)
.  Psychoeducational Group Note  Date: 04/10/2021 Time: 0900-1000    Goal Setting   Purpose of Group: This group helps to provide patients with the steps of setting a goal that is specific, measurable, attainable, realistic and time specific. A discussion on how we keep ourselves stuck with negative self talk.    Participation Level:  Did not attend   Bron Snellings A  

## 2021-04-10 NOTE — [facility] Counselor (Signed)
Adult Comprehensive Assessment  Patient ID: Aaron [name], male   DOB: 1976-02-05, 45 y.o.   MRN: [id number]  Information Source: Information source: Patient  Current Stressors:  Patient states their primary concerns and needs for treatment are:: Depression and a lot of stress Patient states their goals for this hospitilization and ongoing recovery are:: "Get my mind back.  Focus on what I need to do to get myself back on track.  Get back sober, clean, feeling like somebody." Educational / Learning stressors: None noted Employment / Job issues: Lost his job 3 weeks ago, but has been offered another since coming into the hospital Family Relationships: Some stress with mother, steadily insults him instead of uplifting him.  Since that is the only family he has left, he wishes things were different.  Father is deceased, brother is incarcerated. Financial / Lack of resources (include bankruptcy): No income the last 3 weeks Housing / Lack of housing: Homeless and living in his truck the last two weeks Physical health (include injuries & life threatening diseases): Denies stressors Social relationships: Girlfriend gets into his business. Substance abuse: Alcohol use is a stress. Bereavement / Loss: Father is deceased  Living/Environment/Situation:  Living Arrangements: Other (Comment) Living conditions (as described by patient or guardian): In his truck Who else lives in the home?: Alone How long has patient lived in current situation?: 2 weeks What is atmosphere in current home: Temporary  Family History:  Marital status: Long term relationship Long term relationship, how long?: 2 years What types of issues is patient dealing with in the relationship?: She gets into his business Are you sexually active?: Yes What is your sexual orientation?: Straight Does patient have children?: Yes How many children?: 1 How is patient's relationship with their children?: 10yo daughter - has a  good relationship, sees her 4 times a month  Childhood History:  By whom was/is the patient raised?: Both parents Description of patient's relationship with caregiver when they were a child: Basic upbringing, loving relationship with both parents Patient's description of current relationship with people who raised him/her: Father is deceased, mother and he have a "crazy" relationship. How were you disciplined when you got in trouble as a child/adolescent?: Not abused, disciplined appropriately Does patient have siblings?: Yes Number of Siblings: 2 Description of patient's current relationship with siblings: 1 full older brother - incarcerated, brings patient a lot of stress because he relies on patient so much; 1 half-brother who is younger - distant but cordial Did patient suffer any verbal/emotional/physical/sexual abuse as a child?: No Did patient suffer from severe childhood neglect?: No Has patient ever been sexually abused/assaulted/raped as an adolescent or adult?: No Was the patient ever a victim of a crime or a disaster?: No Witnessed domestic violence?: No Has patient been affected by domestic violence as an adult?: No  Education:  Highest grade of school patient has completed: GED Currently a Consulting civil engineer?: No Learning disability?: No  Employment/Work Situation:   Employment situation: Unemployed What is the longest time patient has a held a job?: 5 years 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 Has patient ever been in the [employer]?: No  Financial Resources:   Financial resources: No income Does patient have a Lawyer or guardian?: No  Alcohol/Substance Abuse:   What has been your use of drugs/alcohol within the last 12 months?: Alcohol (sober for 6 months, relapsed the last 2 months even though was living in [place], states they never  knew he was drinking) Alcohol/Substance Abuse Treatment Hx: Past Tx, Inpatient,Attends  AA/NA If yes, describe treatment: Was in a program [facility] until 2 weeks ago, has been to [facility], has been to rehab about 5 times Has alcohol/substance abuse ever caused legal problems?: Yes  Social Support System:   Conservation officer, nature Support System: Fair Development worker, community Support System: Mother, girlfriend Type of faith/religion: Ephriam Knuckles How does patient's faith help to cope with current illness?: Prays, prays, prays  Leisure/Recreation:   Do You Have Hobbies?: No  Strengths/Needs:   What is the patient's perception of their strengths?: Outgoing, fast learner, likeable, determined Patient states they can use these personal strengths during their treatment to contribute to their recovery: Don't give up. Patient states these barriers may affect/interfere with their treatment: None noted Patient states these barriers may affect their return to the community: None noted Other important information patient would like considered in planning for their treatment: None noted  Discharge Plan:   Currently receiving community mental health services: No Patient states concerns and preferences for aftercare planning are: Thinks he needs counseling and AA, is amenable to seeing a doctor Patient states they will know when they are safe and ready for discharge when: Can tell by his thoughts and attitude and feelings. Does patient have access to transportation?: Yes Does patient have financial barriers related to discharge medications?: Yes Patient description of barriers related to discharge medications: No income, no insurance Plan for living situation after discharge: Thinking about moving in with a friend until he gets a couple of paychecks and can get out on his own. Will patient be returning to same living situation after discharge?: No  Summary/Recommendations:   Summary and Recommendations (to be completed by the evaluator): Patient is a 45yo male hospitalized  with a past psychiatric history significant for alcohol dependence, substance-induced mood disorder versus major depression and alcohol withdrawal who presented depression and suicidal ideation in the context of drinking heavily.   He had been living in [facility] [place] house but relapsed on alcohol, lost his job 3 weeks ago, and has been living in his truck for the last 2 weeks.  Primary stressors include his relapse, housing, unemployment, relationship with mother and not wanting girlfriend to insert herself into his business.  Although he states he has been at [facility] since December 2021, the record also indicates at least 6 ED visits in the last 4 months.  He had been attending Alcoholics Anonymous but stopped going, and he does not have any outpatient providers in place.  He has a history of legal problems from alcohol abuse, has been to rehab multiple times, has been on multiple medications.  He believes he has a job awaiting him when he gets out of the hospital.  His truck is being brought to the parking lot and he believes he can go stay with a friend after discharge until he receives several paychecks and can get his own place.  He denies drug use.  Patient would benefit from crisis stabilization, group therapy, medication management, psychoeducation, peer interaction and discharge planning.  At discharge it is recommended that the patient adhere to the established aftercare plan.  Lynnell Chad. 04/10/2021

## 2021-04-10 NOTE — Progress Notes (Signed)
[facility] MD Progress Note  04/10/2021 1:27 PM  Aaron [name]  MRN:  [id number]  Subjective: [name] reports, "I'm feeling kind of depressed because of life in general. I'm trying to get myself together, my goal is to stay sober because I have been offered a new job to start in about 9 days. That will really help me with getting my living situation stable. My family do not understand what I'm going through, I need their support, but all I get is criticism after criticism. That does not help me at all. I tossed & turned all night long last night. My depression is #8 & anxiety #7 today".   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 Daily notes: Aaron [name] is seen, chart reviewed. The chart findings discussed with the treatment team. He presents alert, oriented & aware of situation. He is visible on the unit, but did not attend the morning group session today. He says he did not feel like attending the group after talking with his family members & their criticism of him got deep into his mind. He says each time he tries to talk to his mother about what he is going through, his mother would want to go back in his past to remind him of everything that he has done wrong. He says he does not feel supported by his family. However, Aaron [name] says he has been offered a new job that will start in about 10 days. He says right after discharge, he will go for drug tests & signing of some employment documents. He says this job will help him to get his life situated as his living situation at this time is not the best for him. He is currently denying any SIHI, AVH, delusional thoughts or paranoia. He does not appear to be responding to  any internal stimuli. He denies any side effects from his medications. He denies any substance withdrawal symptoms. He says he tossed & turned last, says his sleep was kind of restless. He will be started on a low of Trazodone 50 mg po Q hs for sleep.   Patient is seen and examined.  Patient is a 45 year old male with the above-stated past psychiatric history was seen in follow-up.  He is basically the same as yesterday.  Perhaps a little worse.  This is most likely secondary to alcohol withdrawal.  Although his vital signs are stable and he is afebrile, he complained of significant nausea and abdominal pain.  He is still depressed.  He stated he just wants to get better.  He stated his suicidal thoughts are slightly decreased.  His blood pressure this morning was 117/88 and repeat was 120/93.  He is afebrile.  Pulse was stable at 80.  Pulse oximetry was at 98% on room air.  Nursing notes reflect he slept 6.75 hours last night.  Respiratory panel from 5/19 was negative for influenza A, B as well as coronavirus.  Principal Problem: Alcohol withdrawal without perceptual disturbances (HCC) Diagnosis: Principal Problem:   Alcohol withdrawal without perceptual disturbances (HCC) Active Problems:   Alcohol dependence (HCC)   Substance induced mood disorder (HCC)  Total Time spent with patient: 15 minutes  Past Psychiatric History: See admission H&P  Past Medical History:  Past Medical History:  Diagnosis Date  .  Depression    History reviewed. No pertinent surgical history. Family History:  Family History  Problem Relation Age of Onset  . Cancer Father    Family Psychiatric  History: See admission H&P  Social History:  Social History   Substance and Sexual Activity  Alcohol Use Yes   Comment: heavy     Social History   Substance and Sexual Activity  Drug Use No    Social History   Socioeconomic History  . Marital status: Single    Spouse name: Not on file  . Number of children:  Not on file  . Years of education: Not on file  . Highest education level: Not on file  Occupational History  . Not on file  Tobacco Use  . Smoking status: Current Every Day Smoker    Packs/day: 0.25    Types: Cigarettes  . Smokeless tobacco: Never Used  Vaping Use  . Vaping Use: Never used  Substance and Sexual Activity  . Alcohol use: Yes    Comment: heavy  . Drug use: No  . Sexual activity: Not on file  Other Topics Concern  . Not on file  Social History Narrative  . Not on file   Social Determinants of Health   Financial Resource Strain: Not on file  Food Insecurity: Not on file  Transportation Needs: Not on file  Physical Activity: Not on file  Stress: Not on file  Social Connections: Not on file   Additional Social History:   Sleep: Good  Appetite:  Fair  Current Medications: Current Facility-Administered Medications  Medication Dose Route Frequency Provider Last Rate Last Admin  . acamprosate (CAMPRAL) tablet 666 mg  666 mg Oral BID WC Antonieta Pert, MD   666 mg at 04/10/21 0911  . acetaminophen (TYLENOL) tablet 650 mg  650 mg Oral Q6H PRN Antonieta Pert, MD      . alum & mag hydroxide-simeth (MAALOX/MYLANTA) 200-200-20 MG/5ML suspension 30 mL  30 mL Oral Q4H PRN Antonieta Pert, MD      . chlordiazePOXIDE (LIBRIUM) capsule 25 mg  25 mg Oral Q6H PRN Antonieta Pert, MD      . escitalopram (LEXAPRO) tablet 20 mg  20 mg Oral Daily Antonieta Pert, MD   20 mg at 04/10/21 1017  . hydrOXYzine (ATARAX/VISTARIL) tablet 25 mg  25 mg Oral Q6H PRN Antonieta Pert, MD   25 mg at 04/09/21 2123  . loperamide (IMODIUM) capsule 2-4 mg  2-4 mg Oral PRN Antonieta Pert, MD      . magnesium hydroxide (MILK OF MAGNESIA) suspension 30 mL  30 mL Oral Daily PRN Antonieta Pert, MD      . multivitamin with minerals tablet 1 tablet  1 tablet Oral Daily Antonieta Pert, MD   1 tablet at 04/10/21 0911  . ondansetron (ZOFRAN-ODT) disintegrating tablet 8 mg   8 mg Oral Q6H PRN Antonieta Pert, MD      . pantoprazole (PROTONIX) EC tablet 40 mg  40 mg Oral Daily Antonieta Pert, MD   40 mg at 04/10/21 0911  . thiamine (B-1) injection 100 mg  100 mg Intramuscular Once Antonieta Pert, MD      . thiamine tablet 100 mg  100 mg Oral Daily Antonieta Pert, MD   100 mg at 04/10/21 0911  . tolnaftate (TINACTIN) 1 % cream   Topical BID Antonieta Pert, MD   Given at 04/10/21 [phone]   Lab Results:  No results found for this or any previous visit (from the past 48 hour(s)).  Blood Alcohol level:  Lab Results  Component Value Date   ETH 189 (H) 04/06/2021   ETH 88 (H) 12/14/2017   Metabolic Disorder Labs: No results found for: HGBA1C, MPG No results found for: PROLACTIN No results found for: CHOL, TRIG, HDL, CHOLHDL, VLDL, LDLCALC  Physical Findings: AIMS: Facial and Oral Movements Muscles of Facial Expression: None, normal Lips and Perioral Area: None, normal Jaw: None, normal Tongue: None, normal,Extremity Movements Upper (arms, wrists, hands, fingers): None, normal Lower (legs, knees, ankles, toes): None, normal, Trunk Movements Neck, shoulders, hips: None, normal, Overall Severity Severity of abnormal movements (highest score from questions above): None, normal Incapacitation due to abnormal movements: None, normal Patient's awareness of abnormal movements (rate only patient's report): No Awareness, Dental Status Current problems with teeth and/or dentures?: No Does patient usually wear dentures?: No  CIWA:  CIWA-Ar Total: 0 COWS:     Musculoskeletal: Strength & Muscle Tone: within normal limits Gait & Station: normal Patient leans: N/A  Psychiatric Specialty Exam:  Presentation  General Appearance: Casual; Fairly Groomed  Eye Contact:Fair  Speech:Normal Rate  Speech Volume:Normal  Handedness:Right  Mood and Affect  Mood:Depressed; Anxious  Affect:Congruent; Flat  Thought Process  Thought  Processes:Coherent  Descriptions of Associations:Intact  Orientation:Full (Time, Place and Person)  Thought Content:Logical  History of Schizophrenia/Schizoaffective disorder:No data recorded Duration of Psychotic Symptoms:No data recorded Hallucinations:Hallucinations: None  Ideas of Reference:None  Suicidal Thoughts:Suicidal Thoughts: No SI Passive Intent and/or Plan: Without Intent; Without Means to Carry Out; Without Plan  Homicidal Thoughts:Homicidal Thoughts: No  Sensorium  Memory:Recent Fair; Remote Fair; Immediate Good  Judgment:Good  Insight:Fair  Executive Functions  Concentration:Good  Attention Span:Fair  Recall:Fair  Fund of Knowledge:Fair  Language:Fair  Psychomotor Activity  Psychomotor Activity:Psychomotor Activity: Normal  Assets  Assets:Communication Skills; Desire for Improvement; Resilience  Sleep  Sleep:Sleep: Fair Number of Hours of Sleep: 6.75  Physical Exam: Physical Exam Vitals and nursing note reviewed.  HENT:     Head: Normocephalic and atraumatic.     Nose: Nose normal.     Mouth/Throat:     Pharynx: Oropharynx is clear.  Eyes:     Pupils: Pupils are equal, round, and reactive to light.  Pulmonary:     Effort: Pulmonary effort is normal.  Neurological:     General: No focal deficit present.     Mental Status: He is alert and oriented to person, place, and time.    Review of Systems  Gastrointestinal: Positive for nausea.  Psychiatric/Behavioral: Positive for depression and substance abuse. The patient is nervous/anxious and has insomnia.   All other systems reviewed and are negative.  Blood pressure (!) 127/91, pulse 71, temperature 98.6 F (37 C), temperature source Oral, resp. rate 18, height 5\' 1"  (1.549 m), weight 73.5 kg, SpO2 97 %. Body mass index is 30.61 kg/m.  Treatment Plan Summary: Daily contact with patient to assess and evaluate symptoms and progress in treatment, Medication management and Plan :  Patient is seen and examined.  Patient is a 45 year old male with the above-stated past psychiatric history who is seen in follow-up.   Continue inpatient hospitalization.  Will continue today 04/10/2021 plan as below except where it is noted.  Diagnosis: 1.  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  Alcohol withdrawal. 3.  Alcohol dependence. 4.  Hyperlipidemia  Pertinent findings on examination today: 1.  Perhaps slightly worse than  yesterday, most likely secondary to alcohol withdrawal syndrome.  Plan: 1.  Continue acamprosate 666 mg p.o. twice daily with food to decrease alcohol cravings. 2.  Continue Librium 25 mg p.o. every 6 hours as needed a CIWA greater than 10. 3.  Continue Lexapro 20 mg p.o. daily for depression and anxiety. 4.  Continue hydroxyzine 25 mg p.o. every 6 hours as needed anxiety. 5.  Continue Imodium 2 to 4 mg p.o. as needed for diarrhea or loose stool. 6.  Continue Protonix 40 mg p.o. daily for gastric protection. 7.  Continue Tinactin 1% cream on athlete's foot treatment bilaterally. 8.  Continue Zofran to 8 mg p.o. every 8 hours as needed nausea and vomiting. 9.  Disposition planning-in progress.  Armandina Stammer, NP, pmhnp, fnp-bc 04/10/2021, 1:27 PMPatient ID: Jordan Hawks, male   DOB: 06-08-1976, 45 y.o.   MRN: [id number]

## 2021-04-10 NOTE — Progress Notes (Signed)
Patient c/o anxiety at 2123 prn vistaril 25 mg PO given and effective by 2220. Denies SI/HI/A/VH.  Support and encouragement provided as needed.

## 2021-04-11 MED ORDER — TRAZODONE HCL 50 MG PO TABS
50.0000 mg | ORAL_TABLET | Freq: Every day | ORAL | Status: DC
  Administered 2021-04-11: 50 mg via ORAL
  Filled 2021-04-11: qty 1
  Filled 2021-04-11: qty 7
  Filled 2021-04-11: qty 1

## 2021-04-11 NOTE — [facility] Group Notes (Signed)
Adult Psychoeducational Group Not Date:  04/11/2021 Time:  1017-5102 Group Topic/Focus: PROGRESSIVE RELAXATION. A group where deep breathing is taught and tensing and relaxation muscle groups is used. Imagery is used as well.  Pts are asked to imagine 3 pillars that hold them up when they are not able to hold themselves up.  Participation Level:  Active  Participation Quality:  Appropriate  Affect:  Appropriate  Cognitive:  Oriented  Insight: Improving  Engagement in Group:  Engaged  Modes of Intervention:  Activity, Discussion, Education, and Support  Additional Comments:  Pt . Rates his energy at a 4/10. States what holds him up is Family, [place] and people  Dione Housekeeper

## 2021-04-11 NOTE — Progress Notes (Signed)
   04/10/21 0915  Psych Admission Type (Psych Patients Only)  Admission Status Voluntary  Psychosocial Assessment  Patient Complaints Anxiety;Depression  Eye Contact Fair  Facial Expression Flat;Worried  Affect Appropriate to circumstance  Speech Logical/coherent;Soft  Interaction Assertive  Motor Activity Slow  Appearance/Hygiene Unremarkable  Behavior Characteristics Cooperative;Anxious  Mood Anxious;Depressed  Thought Process  Coherency WDL  Content WDL  Delusions None reported or observed  Perception WDL  Hallucination None reported or observed  Judgment Limited  Confusion None  Danger to Self  Current suicidal ideation? Denies  Danger to Others  Danger to Others None reported or observed

## 2021-04-11 NOTE — Progress Notes (Signed)
[facility] MD Progress Note  04/11/2021 1:09 PM  Aaron [name]  MRN:  [id number]  Subjective: Aaron [name] reports, "I'm starting to feel better today than the previous days. I slept better last night, but a lot of tossing & turning because I had a lot in my mind. The group session today is helpful. It is about coping skilss to use when distressed or stressed such as breathing exercises. My appetite is improving. Mt depression & anxiety are at #5 today".  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 Daily notes: Aaron [name] is seen, chart reviewed. The chart findings discussed with the treatment team. He presents alert, oriented & aware of situation. He is visible on the unit, he did attend the morning group session today. He says he is glad he attended group sessions this morning because the session focused on coping skills to use when distressed. He says he slept better last night, however, says he tossed & turned through the sleep. He did ask for something to help him have a restful sleep tonight.  Aaron [name] stated yesterday that he has been offered a new job that will start in about 9 days. He says right after discharge, he will go for drug tests & signing of some employment documents. He says this job will help him to get his life situated as his living situation at this time is not the best for him. He is currently denying any SIHI, AVH, delusional thoughts or paranoia. He does not appear to be responding to any internal stimuli. He denies any side effects from his medications. He denies any substance withdrawal symptoms. He will onTrazodone 50 mg po Q hs for sleep.  Principal Problem: Alcohol withdrawal without  perceptual disturbances (HCC) Diagnosis: Principal Problem:   Alcohol withdrawal without perceptual disturbances (HCC) Active Problems:   Alcohol dependence (HCC)   Substance induced mood disorder (HCC)  Total Time spent with patient: 15 minutes  Past Psychiatric History: See admission H&P  Past Medical History:  Past Medical History:  Diagnosis Date  . Depression    History reviewed. No pertinent surgical history. Family History:  Family History  Problem Relation Age of Onset  . Cancer Father    Family Psychiatric  History: See admission H&P  Social History:  Social History   Substance and Sexual Activity  Alcohol Use Yes   Comment: heavy     Social History   Substance and Sexual Activity  Drug Use No    Social History   Socioeconomic History  . Marital status: Single    Spouse name: Not on file  . Number of children: Not on file  . Years of education: Not on file  . Highest education level: Not on file  Occupational History  . Not on file  Tobacco Use  . Smoking status: Current Every Day Smoker    Packs/day: 0.25    Types: Cigarettes  . Smokeless tobacco: Never Used  Vaping Use  . Vaping Use: Never used  Substance and Sexual Activity  . Alcohol use: Yes    Comment: heavy  . Drug use: No  . Sexual activity: Not on file  Other Topics Concern  . Not on file  Social History Narrative  . Not on file   Social Determinants of Health  Financial Resource Strain: Not on file  Food Insecurity: Not on file  Transportation Needs: Not on file  Physical Activity: Not on file  Stress: Not on file  Social Connections: Not on file   Additional Social History:   Sleep: Good  Appetite:  Fair  Current Medications: Current Facility-Administered Medications  Medication Dose Route Frequency Provider Last Rate Last Admin  . acamprosate (CAMPRAL) tablet 666 mg  666 mg Oral BID WC Antonieta Pert, MD   666 mg at 04/11/21 0900  . acetaminophen (TYLENOL)  tablet 650 mg  650 mg Oral Q6H PRN Antonieta Pert, MD      . alum & mag hydroxide-simeth (MAALOX/MYLANTA) 200-200-20 MG/5ML suspension 30 mL  30 mL Oral Q4H PRN Antonieta Pert, MD      . chlordiazePOXIDE (LIBRIUM) capsule 25 mg  25 mg Oral Q6H PRN Antonieta Pert, MD      . escitalopram (LEXAPRO) tablet 20 mg  20 mg Oral Daily Antonieta Pert, MD   20 mg at 04/11/21 0900  . hydrOXYzine (ATARAX/VISTARIL) tablet 25 mg  25 mg Oral Q6H PRN Antonieta Pert, MD   25 mg at 04/09/21 2123  . loperamide (IMODIUM) capsule 2-4 mg  2-4 mg Oral PRN Antonieta Pert, MD      . magnesium hydroxide (MILK OF MAGNESIA) suspension 30 mL  30 mL Oral Daily PRN Antonieta Pert, MD      . multivitamin with minerals tablet 1 tablet  1 tablet Oral Daily Antonieta Pert, MD   1 tablet at 04/11/21 0900  . ondansetron (ZOFRAN-ODT) disintegrating tablet 8 mg  8 mg Oral Q6H PRN Antonieta Pert, MD      . pantoprazole (PROTONIX) EC tablet 40 mg  40 mg Oral Daily Antonieta Pert, MD   40 mg at 04/11/21 0900  . thiamine (B-1) injection 100 mg  100 mg Intramuscular Once Antonieta Pert, MD      . thiamine tablet 100 mg  100 mg Oral Daily Antonieta Pert, MD   100 mg at 04/11/21 0900  . tolnaftate (TINACTIN) 1 % cream   Topical BID Antonieta Pert, MD   Given at 04/11/21 0900  . traZODone (DESYREL) tablet 50 mg  50 mg Oral QHS Verda Mehta I, NP       Lab Results:  No results found for this or any previous visit (from the past 48 hour(s)).  Blood Alcohol level:  Lab Results  Component Value Date   ETH 189 (H) 04/06/2021   ETH 88 (H) 12/14/2017   Metabolic Disorder Labs: No results found for: HGBA1C, MPG No results found for: PROLACTIN No results found for: CHOL, TRIG, HDL, CHOLHDL, VLDL, LDLCALC  Physical Findings: AIMS: Facial and Oral Movements Muscles of Facial Expression: None, normal Lips and Perioral Area: None, normal Jaw: None, normal Tongue: None, normal,Extremity  Movements Upper (arms, wrists, hands, fingers): None, normal Lower (legs, knees, ankles, toes): None, normal, Trunk Movements Neck, shoulders, hips: None, normal, Overall Severity Severity of abnormal movements (highest score from questions above): None, normal Incapacitation due to abnormal movements: None, normal Patient's awareness of abnormal movements (rate only patient's report): No Awareness, Dental Status Current problems with teeth and/or dentures?: No Does patient usually wear dentures?: No  CIWA:  CIWA-Ar Total: 1 COWS:     Musculoskeletal: Strength & Muscle Tone: within normal limits Gait & Station: normal Patient leans: N/A  Psychiatric Specialty Exam:  Presentation  General Appearance: Appropriate  for Environment; Casual  Eye Contact:Good  Speech:Clear and Coherent; Normal Rate  Speech Volume:Normal  Handedness:Right  Mood and Affect  Mood:-- ("I feel Hopeful")  Affect:Appropriate; Congruent  Thought Process  Thought Processes:Coherent  Descriptions of Associations:Intact  Orientation:Full (Time, Place and Person)  Thought Content:Logical  History of Schizophrenia/Schizoaffective disorder:No data recorded Duration of Psychotic Symptoms:No data recorded Hallucinations:Hallucinations: None  Ideas of Reference:None  Suicidal Thoughts:Suicidal Thoughts: No SI Passive Intent and/or Plan: Without Intent; Without Means to Carry Out; Without Access to Means  Homicidal Thoughts:Homicidal Thoughts: No  Sensorium  Memory:Recent Good; Remote Good; Immediate Good  Judgment:Fair ("Improving")  Insight:Fair; Other (comment) ("Improving")  Executive Functions  Concentration:Good  Attention Span:Good  Recall:Fair  Fund of Knowledge:Fair  Language:Good  Psychomotor Activity  Psychomotor Activity:Psychomotor Activity: Normal  Assets  Assets:Communication Skills; Desire for Improvement; Resilience; Vocational/Educational  Sleep  Sleep:Sleep:  Fair Number of Hours of Sleep: 5.75  Physical Exam: Physical Exam Vitals and nursing note reviewed.  HENT:     Head: Normocephalic and atraumatic.     Nose: Nose normal.     Mouth/Throat:     Pharynx: Oropharynx is clear.  Eyes:     Pupils: Pupils are equal, round, and reactive to light.  Pulmonary:     Effort: Pulmonary effort is normal.  Neurological:     General: No focal deficit present.     Mental Status: He is alert and oriented to person, place, and time.    Review of Systems  Gastrointestinal: Positive for nausea.  Psychiatric/Behavioral: Positive for depression and substance abuse. The patient is nervous/anxious and has insomnia.   All other systems reviewed and are negative.  Blood pressure (!) 130/93, pulse 65, temperature 98.2 F (36.8 C), temperature source Oral, resp. rate 18, height 5\' 1"  (1.549 m), weight 73.5 kg, SpO2 98 %. Body mass index is 30.61 kg/m.  Treatment Plan Summary: Daily contact with patient to assess and evaluate symptoms and progress in treatment, Medication management and Plan : Patient is seen and examined.  Patient is a 45 year old male with the above-stated past psychiatric history who is seen in follow-up.   Continue inpatient hospitalization.  Will continue today 04/11/2021 plan as below except where it is noted.  Diagnosis: 1.  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  Alcohol withdrawal. 3.  Alcohol dependence. 4.  Hyperlipidemia  Pertinent findings on examination today: 1.  Perhaps slightly worse than yesterday, most likely secondary to alcohol withdrawal syndrome.  Plan: 1.  Continue acamprosate 666 mg p.o. twice daily with food to decrease alcohol cravings. 2.  Continue Librium 25 mg p.o. every 6 hours as needed a CIWA greater than 10. 3.  Continue Lexapro 20 mg p.o. daily for depression and anxiety. 4.  Continue hydroxyzine 25 mg p.o. every 6 hours as needed  anxiety. 5.  Continue Imodium 2 to 4 mg p.o. as needed for diarrhea or loose stool. 6.  Continue Protonix 40 mg p.o. daily for gastric protection. 7.  Continue Tinactin 1% cream on athlete's foot treatment bilaterally. 8.  Continue Zofran to 8 mg p.o. every 8 hours as needed nausea and vomiting. 9.  Initiated Trazodone 50 mg po Q hs for insomnia. 10. Disposition planning-in progress.  04/13/2021, NP, pmhnp, fnp-bc 04/11/2021, 1:09 PMPatient ID: 04/13/2021, male   DOB: 1976/02/02, 45 y.o.   MRN: [id number] Patient ID: Laron Boorman [name], male   DOB: [birth year], 45 y.o.   MRN: [id number]

## 2021-04-11 NOTE — Progress Notes (Signed)
The focus of this group is to help patients establish daily goals to achieve during treatment and discuss how the patient can incorporate goal setting into their daily lives to aide in recovery. 

## 2021-04-11 NOTE — Plan of Care (Signed)
Nurse discussed anxiety, depression and coping skills with patient.  

## 2021-04-11 NOTE — Progress Notes (Signed)
D:  Patient's self inventory sheet, patient sleeps good, no sleep medication.  Good appetite, Rated depression 5, denied hopeless.  Denied withdrawals, denied SI,  denied physical problems, denied physical pain.  Goal is stay positive.   Think of positive things in his life.  No discharge plans. A:  Medications administered per MD orders.  Emotional support and encouragement given patient. R:  Denied SI and HI, contracts for safety.  Denied A/V hallucinations. Safety maintained with 15 minute checks.

## 2021-04-11 NOTE — Progress Notes (Signed)
Adult Psychoeducational Group Note  Date:  04/11/2021 Time:  9:06 PM  Group Topic/Focus:  Wrap-Up Group:   The focus of this group is to help patients review their daily goal of treatment and discuss progress on daily workbooks.  Participation Level:  Minimal  Participation Quality:  Attentive  Affect:  Appropriate  Cognitive:  Oriented  Insight: Appropriate  Engagement in Group:  Engaged  Modes of Intervention:  Support  Additional Comments:  Pt articulated that his goal for today was to focus on his treatment plan, and this goal was accomplished. Pt stated he did talk with a nurse about his care. Pt conveyed he made a phone call to his girlfriend and reported relationship with his family and support system was improving. Pt reported he took all medications provided and attended all meal services with 100% intake. Pt said his appetite was good today. Pt evaluated his sleep last night as good. Pt verbalized he felt good about himself and rated his overall day an 8 out of 10 on this date. Pt articulated he had no physical pain and rated his pain level a 9 out of 10. Pt denies no auditory or visual hallucinations or thoughts of harming himself or others. Pt said he would alert staff if anything changed. End of Wrap-Up Group progress report.   Nicoletta Dress 04/11/2021, 9:06 PM

## 2021-04-11 NOTE — BHH Group Notes (Signed)
Psychoeducational Group Note  Date: 04-11-21 Time:  1300  Group Topic/Focus:  Making Healthy Choices:   The focus of this group is to help patients identify negative/unhealthy choices they were using prior to admission and identify positive/healthier coping strategies to replace them upon discharge.In this group, patients started asking about the brain and how the brain works with and how the chemicals work for those who use substances, the pros and cons of saboxone.  Participation Level:  Active  Participation Quality:  Appropriate  Affect:  Appropriate  Cognitive:  Oriented  Insight:  Improving  Engagement in Group:  Engaged  Additional Comments: Attending the group. Rates his energy as an 8/10. Listened intently in the group and responded at times by nodding his head.  Dione Housekeeper

## 2021-04-12 MED ORDER — TRAZODONE HCL 50 MG PO TABS: 50.0000 mg | ORAL_TABLET | Freq: Every day | ORAL | 0 refills | Status: DC

## 2021-04-12 MED ORDER — ESCITALOPRAM OXALATE 20 MG PO TABS: 20.0000 mg | ORAL_TABLET | Freq: Every day | ORAL | 0 refills | Status: DC

## 2021-04-12 MED ORDER — ADULT MULTIVITAMIN W/MINERALS CH: 1.0000 | ORAL_TABLET | Freq: Every day | ORAL | Status: DC

## 2021-04-12 MED ORDER — TOLNAFTATE 1 % EX CREA: TOPICAL_CREAM | Freq: Two times a day (BID) | CUTANEOUS | 0 refills | Status: DC

## 2021-04-12 MED ORDER — THIAMINE HCL 100 MG PO TABS: 100.0000 mg | ORAL_TABLET | Freq: Every day | ORAL | Status: DC

## 2021-04-12 MED ORDER — ACAMPROSATE CALCIUM 333 MG PO TBEC: 666.0000 mg | DELAYED_RELEASE_TABLET | Freq: Two times a day (BID) | ORAL | 0 refills | Status: DC

## 2021-04-12 NOTE — Progress Notes (Signed)
Recreation Therapy Notes  Date:  5.23.22 Time: 0930 Location: [address] Dayroom  Group Topic: Stress Management  Goal Area(s) Addresses:  Patient will identify positive stress management techniques. Patient will identify benefits of using stress management post d/c.  Behavioral Response: Attentive  Intervention: Stress Management  Activity:  Meditation.  LRT played a meditation that focused on calming anxiety.  Patients were to listen and follow along as meditation played to fully engage in activity.  Education:  Stress Management, Discharge Planning.   Education Outcome: Acknowledges Education  Clinical Observations/Feedback: Pt attended and participated.  Pt expressed no concerns or questions.    Caroll Rancher, LRT/CTRS         Caroll Rancher A 04/12/2021 12:05 PM

## 2021-04-12 NOTE — BHH Group Notes (Signed)
LCSW Group Therapy Note 04/12/2021 1:15pm  Type of Therapy and Topic:  Group Therapy:  Setting Goals  Participation Level:  Active  Description of Group: In this process group, patients discussed using strengths to work toward goals and address challenges.  Patients identified two positive things about themselves and one goal they were working on.  Patients were given the opportunity to share openly and support each other's plan for self-empowerment.  The group discussed the value of gratitude and were encouraged to have a daily reflection of positive characteristics or circumstances.  Patients were encouraged to identify a plan to utilize their strengths to work on current challenges and goals.  Therapeutic Goals 1. Patient will verbalize personal strengths/positive qualities and relate how these can assist with achieving desired personal goals 2. Patients will verbalize affirmation of peers plans for personal change and goal setting 3. Patients will explore the value of gratitude and positive focus as related to successful achievement of goals 4. Patients will verbalize a plan for regular reinforcement of personal positive qualities and circumstances.  Summary of Patient Progress:Pt shared during introductions and group. Pt shared insight and is goal driven.     Therapeutic Modalities Cognitive Behavioral Therapy Motivational Interviewing    Chrys Racer 04/12/2021 1:41 PM

## 2021-04-12 NOTE — Discharge Summary (Signed)
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 He stated that he was drinking heavily, felt like he was living to labs, and have to lied to people about his drinking. The patient stated he had been at sober living [facility] [place] since approximately December 2021. He got out of the air approximately 2 weeks ago. He stated that he had been drinking there "occasionally" but that they did not know he was drinking. He left there approximately 2 weeks ago and then was drinking much more heavily. He stated he is currently staying with a friend. He stated he feels guilty because the fact that he has a girlfriend, and she is unaware of the sick verity of his alcoholism. He has been in the emergency room multiple times recently. Review of the electronic medical record revealed 6 emergency room visits at least in the last 4 months. His last psychiatric hospitalization at our facility was in 2019. At that time he again was admitted voluntarily due to worsening depression and suicidal ideations. He admitted to his history of alcohol dependence, and had been drinking more heavily recently prior to that admission. He was drinking 2 pints of liquor a day at that time. His blood alcohol on transfer at that point was 88, and was 189 2 days ago when he presented to the emergency  department. He stated that he had been in rehabilitation programs at least for 5 times. He could not remember how he times he was admitted to the hospital. He stated he had 3 or 4 DUIs in the past, but none since he believed in 2013. He stated that he has been on several medications for alcohol craving, but was unable to remember the names of those. He was admitted to the hospital for evaluation and stabilization.  Evaluation on the unit: Patient was seen and evaluated. Patient denies SI/HI/AVH, paranoia and delusions. Patient is taking his medications as prescribed and has no issues with them. Patient reported he is sleeping well, he slept 6 hours last night. He reported a good appetite. Patient reported he feels good and is ready to discharge. He denies alcohol withdrawal symptoms. He lives with a roommate and stated he feels safe there. He denies access to weapons. He is calm and cooperative. He has been attending group therapy and working on coping skills. Patient stated he plans to go to AA for his sobriety. He has follow up appointments listed in his discharge paperwork. Patient agrees to take his medications and attend his follow up appointments for therapy and medication management. Patient is stable for discharge home today.   Principal Problem: Alcohol withdrawal without perceptual disturbances [facility]) Discharge Diagnoses: Principal Problem:   Alcohol withdrawal without perceptual disturbances (HCC) Active Problems:   Alcohol dependence (HCC)   Substance induced mood disorder (HCC)   Past Psychiatric History: See H&P  Past Medical History:  Past Medical History:  Diagnosis Date  . Depression  History reviewed. No pertinent surgical history. Family History:  Family History  Problem Relation Age of Onset  . Cancer Father    Family Psychiatric  History: See H&P Social History:  Social History   Substance and Sexual Activity  Alcohol Use Yes   Comment: heavy     Social  History   Substance and Sexual Activity  Drug Use No    Social History   Socioeconomic History  . Marital status: Single    Spouse name: Not on file  . Number of children: Not on file  . Years of education: Not on file  . Highest education level: Not on file  Occupational History  . Not on file  Tobacco Use  . Smoking status: Current Every Day Smoker    Packs/day: 0.25    Types: Cigarettes  . Smokeless tobacco: Never Used  Vaping Use  . Vaping Use: Never used  Substance and Sexual Activity  . Alcohol use: Yes    Comment: heavy  . Drug use: No  . Sexual activity: Not on file  Other Topics Concern  . Not on file  Social History Narrative  . Not on file   Social Determinants of Health   Financial Resource Strain: Not on file  Food Insecurity: Not on file  Transportation Needs: Not on file  Physical Activity: Not on file  Stress: Not on file  Social Connections: Not on file    Hospital Course:  After the above admission evaluation, [name]'s  presenting symptoms were noted. He was recommended for mood stabilization treatments. The medication regimen targeting those presenting symptoms were discussed with him & initiated with his consent. He was started on Lexapro for depression and acamprosate for alcohol abstinence.  His UDS on arrival to the ED was negative, his BAL was 189. He did not develop any alcohol withdrawal symptoms & did not receive alcohol detoxification treatments. He was however medicated, stabilized & discharged on the medications as listed on his discharge medication list below. Besides the mood stabilization treatments, Aaron [name] was also enrolled & participated in the group counseling sessions being offered & held on this unit. He learned coping skills. He presented no other significant pre-existing medical issues that required treatment. He tolerated his treatment regimen without any adverse effects or reactions reported.   During the course of his  hospitalization, the 15-minute checks were adequate to ensure patient's safety. Aaron [name] did not display any dangerous, violent or suicidal behavior on the unit.  He interacted with patients & staff appropriately, participated appropriately in the group sessions/therapies. His medications were addressed & adjusted to meet his needs. He was recommended for outpatient follow-up care & medication management upon discharge to assure continuity of care & mood stability.  At the time of discharge patient is not reporting any acute suicidal/homicidal ideations. He feels more confident about his self-care & in managing his mental health. He currently denies any new issues or concerns. Education and supportive counseling provided throughout his hospital stay & upon discharge.   Today upon his discharge evaluation with the attending psychiatrist, Aaron [name] shares he is doing well. He denies any other specific concerns. He is sleeping well. His appetite is good. He denies other physical complaints. He denies AH/VH, delusional thoughts or paranoia. He does not appear to be responding to any internal stimuli. He feels that his medications have been helpful & is in agreement to continue his current treatment regimen as recommended. He was able to engage in safety planning including plan to  return to [facility] or contact emergency services if he feels unable to maintain his own safety or the safety of others. Pt had no further questions, comments, or concerns. He left [facility] with all personal belongings in no apparent distress. Transportation per private vehicle.   Physical Findings: AIMS: Facial and Oral Movements Muscles of Facial Expression: None, normal Lips and Perioral Area: None, normal Jaw: None, normal Tongue: None, normal,Extremity Movements Upper (arms, wrists, hands, fingers): None, normal Lower (legs, knees, ankles, toes): None, normal, Trunk Movements Neck, shoulders, hips: None, normal, Overall Severity Severity of  abnormal movements (highest score from questions above): None, normal Incapacitation due to abnormal movements: None, normal Patient's awareness of abnormal movements (rate only patient's report): No Awareness, Dental Status Current problems with teeth and/or dentures?: No Does patient usually wear dentures?: No  CIWA:  CIWA-Ar Total: 2 COWS:     Musculoskeletal: Strength & Muscle Tone: within normal limits Gait & Station: normal Patient leans: N/A  Psychiatric Specialty Exam:  Presentation  General Appearance: Appropriate for Environment  Eye Contact:Good  Speech:Normal Rate  Speech Volume:Normal  Handedness:Right  Mood and Affect  Mood:Euthymic  Affect:Congruent  Thought Process  Thought Processes:Coherent  Descriptions of Associations:Intact  Orientation:Full (Time, Place and Person)  Thought Content:Logical  History of Schizophrenia/Schizoaffective disorder:No data recorded Duration of Psychotic Symptoms:No data recorded Hallucinations:Hallucinations: None  Ideas of Reference:None  Suicidal Thoughts:Suicidal Thoughts: No SI Passive Intent and/or Plan: Without Intent; Without Means to Carry Out; Without Access to Means  Homicidal Thoughts:Homicidal Thoughts: No  Sensorium  Memory:Immediate Good; Immediate Poor; Remote Good  Judgment:Good  Insight:Good  Executive Functions  Concentration:Good  Attention Span:Good  Recall:Good  Fund of Knowledge:Good  Language:Good  Psychomotor Activity  Psychomotor Activity:Psychomotor Activity: Normal  Assets  Assets:Desire for Improvement; Resilience  Sleep  Sleep:Sleep: Good Number of Hours of Sleep: 6  Physical Exam: Physical Exam Vitals and nursing note reviewed.  Constitutional:      Appearance: Normal appearance.  HENT:     Head: Normocephalic.  Pulmonary:     Effort: Pulmonary effort is normal.  Musculoskeletal:        General: Normal range of motion.     Cervical back: Normal range  of motion.  Neurological:     Mental Status: He is alert and oriented to person, place, and time.  Psychiatric:        Attention and Perception: Attention and perception normal. He is attentive. He does not perceive auditory hallucinations.        Mood and Affect: Mood normal.        Speech: Speech normal.        Behavior: Behavior normal. Behavior is cooperative.        Thought Content: Thought content normal. Thought content is not paranoid or delusional. Thought content does not include homicidal or suicidal ideation. Thought content does not include homicidal or suicidal plan.        Cognition and Memory: Cognition normal.    Review of Systems  Constitutional: Negative for fever.  HENT: Negative for congestion and sore throat.   Respiratory: Negative for cough and shortness of breath.   Cardiovascular: Negative for chest pain.  Gastrointestinal: Negative.   Genitourinary: Negative.   Musculoskeletal: Negative.   Neurological: Negative.    Blood pressure 117/77, pulse 73, temperature 98 F (36.7 C), temperature source Oral, resp. rate 18, height 5\' 1"  (1.549 m), weight 73.5 kg, SpO2 99 %. Body mass index is 30.61 kg/m.     Has this  patient used any form of tobacco in the last 30 days? (Cigarettes, Smokeless Tobacco, Cigars, and/or Pipes) Yes, N/A  Blood Alcohol level:  Lab Results  Component Value Date   ETH 189 (H) 04/06/2021   ETH 88 (H) 12/14/2017    Metabolic Disorder Labs:  No results found for: HGBA1C, MPG No results found for: PROLACTIN No results found for: CHOL, TRIG, HDL, CHOLHDL, VLDL, LDLCALC  See Psychiatric Specialty Exam and Suicide Risk Assessment completed by Attending Physician prior to discharge.  Discharge destination:  Home  Is patient on multiple antipsychotic therapies at discharge:  No   Has Patient had three or more failed trials of antipsychotic monotherapy by history:  No  Recommended Plan for Multiple Antipsychotic  Therapies: NA  Discharge Instructions    Diet - low sodium heart healthy   Complete by: As directed    Increase activity slowly   Complete by: As directed      Allergies as of 04/12/2021   No Known Allergies     Medication List    TAKE these medications     Indication  acamprosate 333 MG tablet Commonly known as: CAMPRAL Take 2 tablets (666 mg total) by mouth 2 (two) times daily with a meal.  Indication: Abuse or Misuse of Alcohol   escitalopram 20 MG tablet Commonly known as: LEXAPRO Take 1 tablet (20 mg total) by mouth daily. Start taking on: Apr 13, 2021  Indication: Major Depressive Disorder   multivitamin with minerals Tabs tablet Take 1 tablet by mouth daily. Start taking on: Apr 13, 2021  Indication: Nutritional Support   thiamine 100 MG tablet Take 1 tablet (100 mg total) by mouth daily. Start taking on: Apr 13, 2021  Indication: Deficiency of Vitamin B1   tolnaftate 1 % cream Commonly known as: TINACTIN Apply topically 2 (two) times daily.  Indication: Athlete's Foot, OTC   traZODone 50 MG tablet Commonly known as: DESYREL Take 1 tablet (50 mg total) by mouth at bedtime.  Indication: Trouble Sleeping       Follow-up Information    [facility]. Go on 04/14/2021.   Specialty: Behavioral Health Why: You have an appointment for therapy services on 04/14/21 at 7:45 am.  You also have an appointment for medication management on 04/21/21 at 7:45 am.  These appointments are first come, first served. You can also access substance use services here. Contact information: [address] [place] [address] [phone]              Follow-up recommendations:  Activity:  as tolerated Diet:  Heart healthy  Comments:  Paper prescriptions were given at discharge.  Patient is agreeable with the plan. Patient was given opportunity to ask questions.  Patient appears to feel comfortable with discharge denies any current  suicidal or homicidal thoughts.   Patient is instructed prior to discharge to: Take all medications as prescribed by his mental healthcare provider. Report any adverse effects and or reactions from the medicines to his outpatient provider promptly. Patient has been instructed & cautioned: To not engage in alcohol and or illegal drug use while on prescription medicines. In the event of worsening symptoms, patient is instructed to call the crisis hotline, 911 and or go to the nearest ED for appropriate evaluation and treatment of symptoms. To follow-up with his primary care provider for your other medical issues, concerns and or health care needs.  Signed: Laveda Abbe, NP 04/12/2021, 11:52 AM

## 2021-04-12 NOTE — Progress Notes (Signed)
D:  Patient's self inventory sheet, patient sleeps good, sleep medication helpful.  Good appetite, normal energy level, good concentration.  Denied depression, hopeless and anxiety.  Denied withdrawals.  Denied SI.  Denied physical problems.  Denied physical pain.  Goal keep positive mind frame, thinking positive.  Plans to stay away from negative.  Does have discharge plans. A:  Medications administered per MD orders.  Emotional support and encouragement given patient. R:  Denied SI and HI, contracts for safety.  Denied A/V hallucinations.  Safety maintained with 15 minute checks.

## 2021-04-12 NOTE — Plan of Care (Signed)
Nurse discussed coping skills with patient.  
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
## 2021-04-12 NOTE — Progress Notes (Signed)
  [facility] Adult Case Management Discharge Plan :  Will you be returning to the same living situation after discharge:  No. Friend At discharge, do you have transportation home?: Yes,  personal vechile Do you have the ability to pay for your medications: No.  Release of information consent forms completed and in the chart;  Patient's signature needed at discharge.  Patient to Follow up at:  Follow-up Information    [facility] Go on 04/14/2021.   Specialty: Behavioral Health Why: You have an appointment for therapy services on 04/14/21 at 7:45 am.  You also have an appointment for medication management on 04/21/21 at 7:45 am.  These appointments are first come, first served. You can also access substance use services here. Contact information: [address] [place] [address] [phone]              Next level of care provider has access to [facility] Link:yes  Safety Planning and Suicide Prevention discussed: Yes,  with mother     Has patient been referred to the Quitline?: Patient refused referral  Patient has been referred for addiction treatment: Pt. refused referral  Chrys Racer 04/12/2021, 11:42 AM

## 2021-04-12 NOTE — Progress Notes (Signed)
   04/11/21 2303  Psych Admission Type (Psych Patients Only)  Admission Status Voluntary  Psychosocial Assessment  Patient Complaints None  Eye Contact Fair  Facial Expression Flat;Worried  Affect Appropriate to circumstance  Speech Logical/coherent;Soft  Interaction Assertive  Motor Activity Slow  Appearance/Hygiene Unremarkable  Behavior Characteristics Cooperative;Calm  Mood Pleasant  Thought Process  Coherency WDL  Content WDL  Delusions None reported or observed  Perception WDL  Hallucination None reported or observed  Judgment Limited  Confusion None  Danger to Self  Current suicidal ideation? Denies  Danger to Others  Danger to Others None reported or observed

## 2021-04-12 NOTE — Progress Notes (Signed)
Discharge Note:  Patient discharged home with family.  Suicide prevention information given and discussed with patient who stated he understood and had no questions.  Denied SI and HI.  Denied A/V hallucinations.  Patient stated he received all his belongings, clothing, etc.  Patient stated he appreciated all assistance from [facility] staff.  All required discharge information given.

## 2021-04-12 NOTE — [facility] Suicide Risk Assessment (Signed)
[facility] Discharge Suicide Risk Assessment   Principal Problem: Alcohol withdrawal without perceptual disturbances (HCC) Discharge Diagnoses: Principal Problem:   Alcohol withdrawal without perceptual disturbances (HCC) Active Problems:   Alcohol dependence (HCC)   Substance induced mood disorder (HCC)   Total Time spent with patient: 15 minutes  Musculoskeletal: Strength & Muscle Tone: within normal limits Gait & Station: normal Patient leans: N/A  Psychiatric Specialty Exam  Presentation  General Appearance: Appropriate for Environment  Eye Contact:Good  Speech:Normal Rate  Speech Volume:Normal  Handedness:Right   Mood and Affect  Mood:Euthymic  Duration of Depression Symptoms: No data recorded Affect:Congruent   Thought Process  Thought Processes:Coherent  Descriptions of Associations:Intact  Orientation:Full (Time, Place and Person)  Thought Content:Logical  History of Schizophrenia/Schizoaffective disorder:No data recorded Duration of Psychotic Symptoms:No data recorded Hallucinations:Hallucinations: None  Ideas of Reference:None  Suicidal Thoughts:Suicidal Thoughts: No SI Passive Intent and/or Plan: Without Intent; Without Means to Carry Out; Without Access to Means  Homicidal Thoughts:Homicidal Thoughts: No   Sensorium  Memory:Immediate Good; Immediate Poor; Remote Good  Judgment:Good  Insight:Good   Executive Functions  Concentration:Good  Attention Span:Good  Recall:Good  Fund of Knowledge:Good  Language:Good   Psychomotor Activity  Psychomotor Activity:Psychomotor Activity: Normal   Assets  Assets:Desire for Improvement; Resilience   Sleep  Sleep:Sleep: Good Number of Hours of Sleep: 6   Physical Exam: Physical Exam Vitals and nursing note reviewed.  Constitutional:      Appearance: Normal appearance.  HENT:     Head: Normocephalic and atraumatic.  Pulmonary:     Effort: Pulmonary effort is normal.   Neurological:     General: No focal deficit present.     Mental Status: He is alert and oriented to person, place, and time.    ROS Blood pressure 117/77, pulse 73, temperature 98 F (36.7 C), temperature source Oral, resp. rate 18, height 5\' 1"  (1.549 m), weight 73.5 kg, SpO2 99 %. Body mass index is 30.61 kg/m.  Mental Status Per Nursing Assessment::   On Admission:  Self-harm thoughts  Demographic Factors:  Male and Low socioeconomic status  Loss Factors: Loss of significant relationship  Historical Factors: Impulsivity  Risk Reduction Factors:   Living with another person, especially a relative and Positive social support  Continued Clinical Symptoms:  Depression:   Comorbid alcohol abuse/dependence Impulsivity Alcohol/Substance Abuse/Dependencies  Cognitive Features That Contribute To Risk:  None    Suicide Risk:  Minimal: No identifiable suicidal ideation.  Patients presenting with no risk factors but with morbid ruminations; may be classified as minimal risk based on the severity of the depressive symptoms   Follow-up Information    [facility]. Go on 04/14/2021.   Specialty: Behavioral Health Why: You have an appointment for therapy services on 04/14/21 at 7:45 am.  You also have an appointment for medication management on 04/21/21 at 7:45 am.  These appointments are first come, first served. You can also access substance use services here. Contact information: [address] [place] [id number]              Plan Of Care/Follow-up recommendations:  Activity:  ad lib  [phone], MD 04/12/2021, 11:49 AM

## 2021-04-12 NOTE — [facility] Group Notes (Signed)
ADULT GRIEF GROUP NOTE:   Spiritual care group on grief and loss facilitated by chaplain Dyanne Carrel, [facility]   Group Goal:   Support / Education around grief and loss   Members engage in facilitated group support and psycho-social education.   Group Description:   Following introductions and group rules, group members engaged in facilitated group dialog and support around topic of loss, with particular support around experiences of loss in their lives. Group Identified types of loss (relationships / self / things) and identified patterns, circumstances, and changes that precipitate losses. Reflected on thoughts / feelings around loss, normalized grief responses, and recognized variety in grief experience. Group noted Worden's four tasks of grief in discussion.   Group drew on Adlerian / Rogerian, narrative, MI,   Patient Progress: Aaron [name] attended and participated in group.  He was an attentive listener to his peers and showed physical cues of resonating with what they shared.  He did not share much personally, but was actively engaged.  Chaplain Dyanne Carrel, Bcc Pager, [phone] 4:30 PM

## 2021-04-19 ENCOUNTER — Emergency Department (HOSPITAL_COMMUNITY)
Admission: EM | Admit: 2021-04-19 | Discharge: 2021-04-21 | Disposition: A | Discharge status: Home / Self Care | Payer: Federal, State, Local not specified - Other | Attending: Emergency Medicine | Admitting: Emergency Medicine

## 2021-04-19 ENCOUNTER — Other Ambulatory Visit: Payer: Self-pay

## 2021-04-19 DIAGNOSIS — F333 Major depressive disorder, recurrent, severe with psychotic symptoms: Secondary | ICD-10-CM | POA: Diagnosis present

## 2021-04-19 DIAGNOSIS — F1020 Alcohol dependence, uncomplicated: Secondary | ICD-10-CM | POA: Diagnosis present

## 2021-04-19 DIAGNOSIS — Y905 Blood alcohol level of 100-119 mg/100 ml: Secondary | ICD-10-CM | POA: Insufficient documentation

## 2021-04-19 DIAGNOSIS — F1094 Alcohol use, unspecified with alcohol-induced mood disorder: Secondary | ICD-10-CM | POA: Diagnosis present

## 2021-04-19 DIAGNOSIS — F17210 Nicotine dependence, cigarettes, uncomplicated: Secondary | ICD-10-CM | POA: Insufficient documentation

## 2021-04-19 DIAGNOSIS — R45851 Suicidal ideations: Secondary | ICD-10-CM | POA: Insufficient documentation

## 2021-04-19 DIAGNOSIS — F10939 Alcohol use, unspecified with withdrawal, unspecified: Secondary | ICD-10-CM | POA: Diagnosis present

## 2021-04-19 DIAGNOSIS — F10239 Alcohol dependence with withdrawal, unspecified: Secondary | ICD-10-CM | POA: Diagnosis present

## 2021-04-19 DIAGNOSIS — F1010 Alcohol abuse, uncomplicated: Secondary | ICD-10-CM

## 2021-04-19 DIAGNOSIS — F1014 Alcohol abuse with alcohol-induced mood disorder: Secondary | ICD-10-CM | POA: Insufficient documentation

## 2021-04-19 DIAGNOSIS — Z20822 Contact with and (suspected) exposure to covid-19: Secondary | ICD-10-CM | POA: Insufficient documentation

## 2021-04-19 NOTE — ED Triage Notes (Signed)
Pt states having thoughts of hurting himself. Ongoing for 1 month. Not complaint with medications.

## 2021-04-20 ENCOUNTER — Encounter (HOSPITAL_COMMUNITY): Payer: Self-pay | Admitting: Registered Nurse

## 2021-04-20 DIAGNOSIS — R45851 Suicidal ideations: Secondary | ICD-10-CM

## 2021-04-20 DIAGNOSIS — F1010 Alcohol abuse, uncomplicated: Secondary | ICD-10-CM | POA: Diagnosis not present

## 2021-04-20 DIAGNOSIS — F333 Major depressive disorder, recurrent, severe with psychotic symptoms: Secondary | ICD-10-CM

## 2021-04-20 DIAGNOSIS — F1094 Alcohol use, unspecified with alcohol-induced mood disorder: Secondary | ICD-10-CM | POA: Diagnosis not present

## 2021-04-20 DIAGNOSIS — F1020 Alcohol dependence, uncomplicated: Secondary | ICD-10-CM

## 2021-04-20 LAB — CBC
HCT: 45.3 % (ref 39.0–52.0)
Hemoglobin: 14.6 g/dL (ref 13.0–17.0)
MCH: 29.7 pg (ref 26.0–34.0)
MCHC: 32.2 g/dL (ref 30.0–36.0)
MCV: 92.3 fL (ref 80.0–100.0)
Platelets: 402 10*3/uL — ABNORMAL HIGH (ref 150–400)
RBC: 4.91 MIL/uL (ref 4.22–5.81)
RDW: 14.9 % (ref 11.5–15.5)
WBC: 7.2 10*3/uL (ref 4.0–10.5)
nRBC: 0 % (ref 0.0–0.2)

## 2021-04-20 LAB — COMPREHENSIVE METABOLIC PANEL
ALT: 33 U/L (ref 0–44)
AST: 25 U/L (ref 15–41)
Albumin: 3.4 g/dL — ABNORMAL LOW (ref 3.5–5.0)
Alkaline Phosphatase: 58 U/L (ref 38–126)
Anion gap: 11 (ref 5–15)
BUN: 8 mg/dL (ref 6–20)
CO2: 23 mmol/L (ref 22–32)
Calcium: 8.7 mg/dL — ABNORMAL LOW (ref 8.9–10.3)
Chloride: 104 mmol/L (ref 98–111)
Creatinine, Ser: 0.79 mg/dL (ref 0.61–1.24)
GFR, Estimated: >60 mL/min (ref >60)
Glucose, Bld: 108 mg/dL — ABNORMAL HIGH (ref 70–99)
Potassium: 3.4 mmol/L — ABNORMAL LOW (ref 3.5–5.1)
Sodium: 138 mmol/L (ref 135–145)
Total Bilirubin: 0.4 mg/dL (ref 0.3–1.2)
Total Protein: 6.8 g/dL (ref 6.5–8.1)

## 2021-04-20 LAB — RESP PANEL BY RT-PCR (FLU A&B, COVID) ARPGX2
Influenza A by PCR: NEGATIVE
Influenza B by PCR: NEGATIVE
SARS Coronavirus 2 by RT PCR: NEGATIVE

## 2021-04-20 LAB — RAPID URINE DRUG SCREEN, HOSP PERFORMED
Amphetamines: NOT DETECTED
Barbiturates: NOT DETECTED
Benzodiazepines: NOT DETECTED
Cocaine: NOT DETECTED
Opiates: NOT DETECTED
Tetrahydrocannabinol: NOT DETECTED

## 2021-04-20 LAB — SALICYLATE LEVEL: Salicylate Lvl: <7 mg/dL — ABNORMAL LOW (ref 7.0–30.0)

## 2021-04-20 LAB — ACETAMINOPHEN LEVEL: Acetaminophen (Tylenol), Serum: <10 ug/mL — ABNORMAL LOW (ref 10–30)

## 2021-04-20 LAB — ETHANOL: Alcohol, Ethyl (B): 108 mg/dL — ABNORMAL HIGH (ref <10)

## 2021-04-20 MED ORDER — TRAZODONE HCL 50 MG PO TABS
50.0000 mg | ORAL_TABLET | Freq: Every day | ORAL | Status: DC
  Administered 2021-04-20: 50 mg via ORAL
  Filled 2021-04-20: qty 1

## 2021-04-20 MED ORDER — THIAMINE HCL 100 MG PO TABS
100.0000 mg | ORAL_TABLET | Freq: Once | ORAL | Status: AC
  Administered 2021-04-20: 100 mg via ORAL
  Filled 2021-04-20: qty 1

## 2021-04-20 MED ORDER — THIAMINE HCL 100 MG PO TABS
100.0000 mg | ORAL_TABLET | Freq: Every day | ORAL | Status: DC
  Administered 2021-04-20: 100 mg via ORAL
  Filled 2021-04-20: qty 1

## 2021-04-20 MED ORDER — ACAMPROSATE CALCIUM 333 MG PO TBEC
666.0000 mg | DELAYED_RELEASE_TABLET | Freq: Two times a day (BID) | ORAL | Status: DC
  Administered 2021-04-20 (×2): 666 mg via ORAL
  Filled 2021-04-20 (×4): qty 2

## 2021-04-20 MED ORDER — ESCITALOPRAM OXALATE 10 MG PO TABS
20.0000 mg | ORAL_TABLET | Freq: Every day | ORAL | Status: DC
  Administered 2021-04-20: 20 mg via ORAL
  Filled 2021-04-20: qty 2

## 2021-04-20 NOTE — Consult Note (Addendum)
[facility] ED Discharge  04/20/2021 12:16 PM Aaron [name]  MRN:  [id number]   Reason for Consult:  Suicidal ideation Referring Physician:  Antony Madura, PA-C Location of Patient: [facility] ED Location of Provider:  Other: [facility]  Principal Problem: Alcohol-induced mood disorder with depressive symptoms [facility]) Discharge Diagnoses: Principal Problem:   Alcohol-induced mood disorder with depressive symptoms (HCC) Active Problems:   MDD (major depressive disorder), recurrent, severe, with psychosis (HCC)   Alcohol withdrawal without perceptual disturbances (HCC)   Alcohol dependence (HCC)   Subjective: "The day I got out of the hospital; I was right back where I started doing the things I was doing before I went to the hospital.  I need to get some help.  I want to do rehab or something that will help." Patient has had 7 ED visits and 1 psychiatric hospitalization in the last 6 months.  Patient recently discharged from [facility] 04/12/21.     Aaron [name], 45 y.o., male patient seen via tele health by this provider, consulted with Dr. Earlene Plater; and chart reviewed on 04/20/21.  On evaluation Maxtyn Nuzum [name] reports he was recently discharged from psychiatric hospital "but when I was in there I wasn't really concentrating on what was going on because somebody had my car and I was worrying about getting it back."  Patient states he did not get his prescription for Lexapro fill but still has the prescription; states he did not follow up with outpatient psychiatric services that had been set up.  Patient informed that he has an appointment for medication management tomorrow at [facility] 04/21/21; but he states he needs to go to rehab "or somewhere I can get help."  States he doesn't feel that he would do well with just outpatient psychiatric services right now.  Patient states that he is staying with a friend.  Patient asked about his suicidal statement of shooting himself  with friends gun or thoughts of wanting to kill or hurt himself patient states "I don't want to kill myself; wait; I don't want to say nothing and then I don't get into rehab."  This provider explained to patient that being suicidal doesn't speed up the possibility of getting into a long term rehab facility; explained that if he is suicidal that more than likely he would not be eligible for rehab facility related to the fact that they are unable to watch their patient every 15 minutes or put patients on suicidal precautions; patient needed to be safe and stabilized prior to going to rehab.  Patient then stated "I just need to get some help.  I want to do rehab of something that will help me.  If I can get into a rehab I will be safe."   Patient reports that he doesn't have access to gun but would not give the name of his male friend to call.   During evaluation Aaron [name] is sitting up in bed in no acute distress.  He is alert, oriented x 4, calm and cooperative.  His mood is dysphoric with congruent affect.  He does not appear to be responding to internal/external stimuli or delusional thoughts.  Patient denies suicidal/self-harm/homicidal ideation, psychosis, and paranoia.  Patient answered question appropriately.  Total Time spent with patient: 45 minutes  Past Psychiatric History: See above  Past Medical History:  Past Medical History:  Diagnosis Date  . Depression    History reviewed. No pertinent surgical history. Family  History:  Family History  Problem Relation Age of Onset  . Cancer Father    Family Psychiatric  History: None noted Social History:  Social History   Substance and Sexual Activity  Alcohol Use Yes   Comment: heavy     Social History   Substance and Sexual Activity  Drug Use No    Social History   Socioeconomic History  . Marital status: Single    Spouse name: Not on file  . Number of children: Not on file  . Years of education: Not on file  .  Highest education level: Not on file  Occupational History  . Not on file  Tobacco Use  . Smoking status: Current Every Day Smoker    Packs/day: 0.25    Types: Cigarettes  . Smokeless tobacco: Never Used  Vaping Use  . Vaping Use: Never used  Substance and Sexual Activity  . Alcohol use: Yes    Comment: heavy  . Drug use: No  . Sexual activity: Not on file  Other Topics Concern  . Not on file  Social History Narrative  . Not on file   Social Determinants of Health   Financial Resource Strain: Not on file  Food Insecurity: Not on file  Transportation Needs: Not on file  Physical Activity: Not on file  Stress: Not on file  Social Connections: Not on file    Has this patient used any form of tobacco in the last 30 days? (Cigarettes, Smokeless Tobacco, Cigars, and/or Pipes) A prescription for an FDA-approved tobacco cessation medication was offered at discharge and the patient refused  Current Medications: Current Facility-Administered Medications  Medication Dose Route Frequency Provider Last Rate Last Admin  . acamprosate (CAMPRAL) tablet 666 mg  666 mg Oral BID WC Antony Madura, PA-C   666 mg at 04/20/21 0926  . escitalopram (LEXAPRO) tablet 20 mg  20 mg Oral Daily Antony Madura, PA-C   20 mg at 04/20/21 1610  . thiamine tablet 100 mg  100 mg Oral Daily Antony Madura, PA-C   100 mg at 04/20/21 9604  . traZODone (DESYREL) tablet 50 mg  50 mg Oral QHS Antony Madura, PA-C       Current Outpatient Medications  Medication Sig Dispense Refill  . acamprosate (CAMPRAL) 333 MG tablet Take 2 tablets (666 mg total) by mouth 2 (two) times daily with a meal. (Patient not taking: Reported on 04/20/2021) 120 tablet 0  . escitalopram (LEXAPRO) 20 MG tablet Take 1 tablet (20 mg total) by mouth daily. (Patient not taking: Reported on 04/20/2021) 30 tablet 0  . Multiple Vitamin (MULTIVITAMIN WITH MINERALS) TABS tablet Take 1 tablet by mouth daily. (Patient not taking: Reported on 04/20/2021)    .  thiamine 100 MG tablet Take 1 tablet (100 mg total) by mouth daily. (Patient not taking: Reported on 04/20/2021)    . tolnaftate (TINACTIN) 1 % cream Apply topically 2 (two) times daily. (Patient not taking: Reported on 04/20/2021) 30 g 0  . traZODone (DESYREL) 50 MG tablet Take 1 tablet (50 mg total) by mouth at bedtime. (Patient not taking: Reported on 04/20/2021) 30 tablet 0   PTA Medications: (Not in a hospital admission)   Musculoskeletal: Strength & Muscle Tone: within normal limits Gait & Station: normal Patient leans: N/A  Psychiatric Specialty Exam:  Presentation  General Appearance: Appropriate for Environment; Casual  Eye Contact:Good  Speech:Clear and Coherent; Normal Rate  Speech Volume:Normal  Handedness:Right   Mood and Affect  Mood:Dysphoric  Affect:Appropriate; Congruent  Thought Process  Thought Processes:Coherent; Goal Directed  Descriptions of Associations:Intact  Orientation:Full (Time, Place and Person)  Thought Content:WDL  History of Schizophrenia/Schizoaffective disorder:No  Duration of Psychotic Symptoms:No data recorded Hallucinations:Hallucinations: None  Ideas of Reference:None  Suicidal Thoughts:Suicidal Thoughts: No (Patient denies suicidal thoughts at this time; stating that if he is able to get into a rehab facility he would be safe.) SI Passive Intent and/or Plan: -- (Patient reported earlier that he had thoughts of shooting himself with a friends gun or overdosing.  Denies at this time stating he just wanted to get help)  Homicidal Thoughts:Homicidal Thoughts: No   Sensorium  Memory:Immediate Good; Recent Good  Judgment:Intact  Insight:Present   Executive Functions  Concentration:Good  Attention Span:Good  Recall:Good  Fund of Knowledge:Good  Language:Good   Psychomotor Activity  Psychomotor Activity:Psychomotor Activity: Normal   Assets  Assets:Communication Skills; Desire for Improvement; Housing;  Resilience; Social Support   Sleep  Sleep:Sleep: Good    Physical Exam: Physical Exam Vitals and nursing note reviewed. Exam conducted with a chaperone present.  Constitutional:      General: He is not in acute distress.    Appearance: Normal appearance. He is obese. He is not ill-appearing.  Cardiovascular:     Rate and Rhythm: Normal rate.  Pulmonary:     Effort: Pulmonary effort is normal.  Neurological:     Mental Status: He is alert and oriented to person, place, and time.  Psychiatric:        Attention and Perception: Attention and perception normal. He does not perceive auditory or visual hallucinations.        Mood and Affect: Mood and affect normal.        Speech: Speech normal.        Behavior: Behavior normal.        Thought Content: Thought content normal. Thought content is not paranoid or delusional. Thought content does not include homicidal or suicidal ideation.        Cognition and Memory: Cognition and memory normal.        Judgment: Judgment normal.    Review of Systems  Constitutional: Negative.   HENT: Negative.   Eyes: Negative.   Respiratory: Negative.   Cardiovascular: Negative.   Gastrointestinal: Negative.   Genitourinary: Negative.   Musculoskeletal: Negative.   Skin: Negative.   Neurological: Negative.   Endo/Heme/Allergies: Negative.   Psychiatric/Behavioral: Negative for hallucinations and memory loss. Depression: Stable. Substance abuse: Alcohol. Suicidal ideas: Denies at this time and stating that he would be safe if he is able to go to a rehab facility. The patient is not nervous/anxious and does not have insomnia.    Blood pressure 111/76, pulse 86, temperature 98.4 F (36.9 C), temperature source Oral, resp. rate 15, height 5\' 1"  (1.549 m), weight 72.6 kg, SpO2 95 %. Body mass index is 30.24 kg/m.   Demographic Factors:  Male, Low socioeconomic status and Unemployed  Loss Factors: NA  Historical Factors: Prior suicide  attempts  Risk Reduction Factors:   Religious beliefs about death, Living with another person, especially a relative and Suppose to be starting a new job  Continued Clinical Symptoms:  Alcohol/Substance Abuse/Dependencies  Cognitive Features That Contribute To Risk:  None    Suicide Risk:  Minimal: No identifiable suicidal ideation.  Patients presenting with no risk factors but with morbid ruminations; may be classified as minimal risk based on the severity of the depressive symptoms   Follow-up Information    [facility]   Specialty: Urgent Care Why: Open Access Hours for walk in: Monday - Thursday 8:00 to 11:00 AM for medication management and therapy intake.  Friday from 1:00 to 4:00 PM for therapy intake only Contact information: [address] [place] [address] [phone]              Plan Of Care/Follow-up recommendations:  Activity:  As tolerated Diet:  Heart healthy  Disposition:  Psychiatrically cleared No evidence of imminent risk to self or others at present.   Patient does not meet criteria for psychiatric inpatient admission. Supportive therapy provided about ongoing stressors. Refer to IOP. Discussed crisis plan, support from social network, calling 911, coming to the Emergency Department, and calling Suicide Hotline.    Discharge Instructions        Alcohol Abuse and Dependence Information, Adult Alcohol is a widely available drug. People drink alcohol in different amounts. People who drink alcohol very often and in large amounts often have problems during and after drinking. They may develop what is called an alcohol use disorder. There are two main types of alcohol use disorders:  Alcohol abuse. This is when you use alcohol too much or too often. You may use alcohol to make yourself feel happy or to reduce stress. You may have a hard time setting a limit on the amount you drink.  Alcohol  dependence. This is when you use alcohol consistently for a period of time, and your body changes as a result. This can make it hard to stop drinking because you may start to feel sick or feel different when you do not use alcohol. These symptoms are known as withdrawal. How can alcohol abuse and dependence affect me? Alcohol abuse and dependence can have a negative effect on your life. Drinking too much can lead to addiction. You may feel like you need alcohol to function normally. You may drink alcohol before work in the morning, during the day, or as soon as you get home from work in the evening. These actions can result in:  Poor work performance.  Job loss.  Financial problems.  Car crashes or criminal charges from driving after drinking alcohol.  Problems in your relationships with friends and family.  Losing the trust and respect of coworkers, friends, and family. Drinking heavily over a long period of time can permanently damage your body and brain, and can cause lifelong health issues, such as:  Damage to your liver or pancreas.  Heart problems, high blood pressure, or stroke.  Certain cancers.  Decreased ability to fight infections.  Brain or nerve damage.  Depression.  Early (premature) death. If you are careless or you crave alcohol, it is easy to drink more than your body can handle (overdose). Alcohol overdose is a serious situation that requires hospitalization. It may lead to permanent injuries or death. What can increase my risk?  Having a family history of alcohol abuse.  Having depression or other mental health conditions.  Beginning to drink at an early age.  Binge drinking often.  Experiencing trauma, stress, and an unstable home life during childhood.  Spending time with people who drink often. What actions can I take to prevent or manage alcohol abuse and dependence?  Do not drink alcohol if: ? Your health care provider tells you not to  drink. ? You are pregnant, may be pregnant, or are planning to become pregnant.  If you drink alcohol: ? Limit how much you use to:  0-1  drink a day for women.  0-2 drinks a day for men. ? Be aware of how much alcohol is in your drink. In the [place] one drink equals one 12 oz bottle of beer (355 mL), one 5 oz glass of wine (148 mL), or one 1 oz glass of hard liquor (44 mL).  Stop drinking if you have been drinking too much. This can be very hard to do if you are used to abusing alcohol. If you begin to have withdrawal symptoms, talk with your health care provider or a person that you trust. These symptoms may include anxiety, shaky hands, headache, nausea, sweating, or not being able to sleep.  Choose to drink nonalcoholic beverages in social gatherings and places where there may be alcohol. Activity  Spend more time on activities that you enjoy that do not involve alcohol, like hobbies or exercise.  Find healthy ways to cope with stress, such as exercise, meditation, or spending time with people you care about. General information  Talk to your family, coworkers, and friends about supporting you in your efforts to stop drinking. If they drink, ask them not to drink around you. Spend more time with people who do not drink alcohol.  If you think that you have an alcohol dependency problem: ? Tell friends or family about your concerns. ? Talk with your health care provider or another health professional about where to get help. ? Work with a Paramedic and a Network engineer. ? Consider joining a support group for people who struggle with alcohol abuse and dependence. Where to find support  Your health care provider.  SMART Recovery: www.smartrecovery.org Therapy and support groups  Local treatment centers or chemical dependency counselors.  Local AA groups in your community: SalaryStart.tn   Where to find more information  Centers for Disease Control and Prevention:  FootballExhibition.com.br  [employer] on Alcohol Abuse and Alcoholism: BasicStudents.dk  Alcoholics Anonymous (AA): SalaryStart.tn Contact a health care provider if:  You drank more or for longer than you intended on more than one occasion.  You tried to stop drinking or to cut back on how much you drink, but you were not able to.  You often drink to the point of vomiting or passing out.  You want to drink so badly that you cannot think about anything else.  You have problems in your life due to drinking, but you continue to drink.  You keep drinking even though you feel anxious, depressed, or have experienced memory loss.  You have stopped doing the things you used to enjoy in order to drink.  You have to drink more than you used to in order to get the effect you want.  You experience anxiety, sweating, nausea, shakiness, and trouble sleeping when you try to stop drinking. Get help right away if:  You have thoughts about hurting yourself or others.  You have serious withdrawal symptoms, including: ? Confusion. ? Racing heart. ? High blood pressure. ? Fever. If you ever feel like you may hurt yourself or others, or have thoughts about taking your own life, get help right away. You can go to your nearest emergency department or call:  Your local emergency services (911 in the [place]  A suicide crisis helpline, such as the National Suicide Prevention Lifeline at 878 094 9163. This is open 24 hours a day. Summary  Alcohol abuse and dependence can have a negative effect on your life. Drinking too much or too often can lead to addiction.  If you  drink alcohol, limit how much you use.  If you are having trouble keeping your drinking under control, find ways to change your behavior. Hobbies, calming activities, exercise, or support groups can help.  If you feel you need help with changing your drinking habits, talk with your health care provider, a good friend, or a therapist, or go  to an AA group. This information is not intended to replace advice given to you by your health care provider. Make sure you discuss any questions you have with your health care provider. Document Revised: 02/26/2019 Document Reviewed: 01/15/2019 Elsevier Patient Education  2021 Elsevier Inc.   Finding Treatment for Addiction Addiction is a complex disease of the brain that causes an uncontrollable (compulsive) need for:  A substance. This includes alcohol, illegal drugs, or prescription medicines, such as painkillers.  An activity or behavior, such as gambling or shopping. Addiction changes the way your brain works. Because of this change:  The need for the medicine, drug, or activity can become so strong that you think about it all the time.  Getting more and more of your addiction becomes the most important thing to you.  You may find yourself leaving other activities and relationships to pursue your addiction.  You can become physically dependent on a substance.  Your health, behavior, emotions, and relationships can change for the worse. How do I know if I need treatment for addiction? Addiction is a progressive disease. Without treatment, addiction can get worse. Living with addiction puts you at higher risk for injury, poor health, loss of employment, loss of money, and even death. You might need treatment for addiction if:  You have tried to stop or cut down, but you have not succeeded.  You find it annoying that your friends and family are concerned about your use or behavior.  You feel guilty about your use or behavior.  You need a particular substance or activity to start your day or to calm down.  You are running out of money because of your addiction.  You have done something illegal to support your addiction.  Your addiction has caused you: ? Health problems. ? Trouble in school, work, home, or with the police. ? To devote all your time to your addiction, and  not to other responsibilities. ? To tell lies in order to hide your problem. What types of treatment are available? There may be options for treatment programs and plans based on your addiction, condition, needs, and preferences. No single treatment is right for everyone.  Treatment programs can be: ? Outpatient. You live at home and go to work or school, but you go to a clinic for treatment. ? Inpatient. You live and sleep at the program facility during treatment.  Programs may include: ? Medicine. You may need medicine to treat the addiction itself, or to treat anxiety or depression. ? Counseling and behavior therapy. This can help individuals and families behave in healthier ways and relate more effectively. ? Support groups. Confidential group therapy, such as a 12-step program, can help individuals and families during treatment and recovery. ? A combination of education, counseling, and a 12-step, spirituality-based approach.   What should I consider when selecting a treatment program? Think about your individual requirements when selecting a treatment program. Ask about:  The overall approach to treatment. ? Some programs are strictly 12-step programs. Some have a more flexible approach. ? Programs may differ in length of stay, setting, and size. ? Some programs include your family  in your treatment plan. Support may be offered to them throughout the treatment process, as well as instructions for them when you are discharged. ? You may continue to receive support after you have left the program.  The types of medical services that are offered. Find out if the program: ? Offers specific treatment for your particular addiction. ? Meets all of your needs, including physical and cultural needs. ? Includes any medicines you might need. ? Offers mental health counseling as part of your treatment. ? Offers the 12-step meetings at the center, or if transport is available for patients to  attend meetings at other locations.  The cost and types of insurance that are accepted. ? Some programs are sponsored by the government. They support patients who do not have private insurance. ? If you do not have insurance, or if you choose to attend a program that does not accept your insurance, call the treatment center. Tell them your financial needs and whether a payment plan can be set up. ? There are also organizations that will help you find the resources for treatment. You can find them online by searching "treatment for addiction."  If the program is certified by the appropriate government agency. Where to find support  Your health care provider can help you to find the right treatment. These discussions are confidential.  The [employer] on Alcoholism and Drug Dependence (NCADD). This group has information about treatment centers and programs for people who have an addiction and for family members. ? Call: 1-800-NCA-CALL ((902) 836-1401). ? Visit the website: https://www.ncadd.org/  The Substance Abuse and Mental Health Services Administration [facility]). This organization will help you find publicly funded treatment centers, help hotlines, and counseling services near you. ? Call: 1-800-662-HELP (667-616-6412). ? Visit the website: www.findtreatment.RockToxic.pl  The National Problem Gambling Helpline. This is a 24-hour confidential helpline for gambling addiction. ? Call: (513)330-9300 ? Visit the website: CocoaInvestor.tn In countries outside of the [place] and [place], look in [employer] for contact information for services in your area. Follow these instructions at home:  Find supportive people who will help you stay away from your addiction and stay sober.  Do not use the substance or engage in the activity.  If you have been through treatment: ? Follow your plan. The plan is usually developed by you and your health care provider during  treatment. ? Go to meetings with other people in recovery. ? Avoid people, situations, and things that lead you to do the things you are addicted to (triggers). Summary  Addiction changes the way your brain works. These changes cause a desire to repeat and increase the use of the a substance or behavior.  Addiction is a progressive disease. Without treatment, addiction can get worse. Living with addiction puts you at higher risk for injury, poor health, loss of employment, loss of money, and even death.  There may be options for treatment programs and plans based on your addiction, condition, needs, and preferences. No single treatment is right for everyone.  Your health care provider can help you to find the right treatment. These discussions are confidential. This information is not intended to replace advice given to you by your health care provider. Make sure you discuss any questions you have with your health care provider. Document Revised: 07/31/2019 Document Reviewed: 12/06/2017 Elsevier Patient Education  2021 [employer]. [facility] DIV[facility]  [address] [place], [place] [address] The [facility] provides food, shelter  and other programs and services to the homeless men of [place] through our [employer]. By offering safe shelter, three meals a day, clean clothing, Biblical counseling, financial planning, vocational training, GED/education and employment assistance, we've helped mend the shattered lives of many homeless men since opening in 1974. We have 388 beds available with an additional 88 mats for emergency situations. Prospective Client Check-In Information . Must provide a photo I.D. within 30 days of your acceptance into the DRM program. . Help with chores around the [facility]. . No sex offender of any type (pending, charged, registered and/or any other sex related offenses) will be permitted to check  in. . Must be willing to abide by all rules, regulations, and policies established by the [employer]. . The following will be provided - shelter, food, clothing, and biblical counseling. If you or someone you know is in need of assistance at our [facility] shelter in [place], [place], please call [phone] ext. [phone]. The [employer] The [employer] is the [employer] 13-month recovery program designed to provide holistic restoration for addicted men and women. Through addiction counseling and other related programs, participants will be able to return to the workforce and become a contributing member of society once again.   Other Rehab Facilities Substance Abuse Resources  [facility] - Admissions are currently completed Monday through Friday at 8am; both appointments and walk-ins are accepted.  Any individual that is a [facility] resident may present for a substance abuse screening and assessment for admission.  A person may be referred by numerous sources or self-refer.   Potential clients will be screened for medical necessity and appropriateness for the program.  Clients must meet criteria for high-intensity residential treatment services.  If clinically appropriate, a client will continue with the comprehensive clinical assessment and intake process, as well as enrollment in the [facility].   Address: [address] [place], [place] [address] Admin Hours: Mon-Fri 8AM to [facility] Center Hours: 24/7 Phone: [phone] Fax: [phone]   [facility] (Detox) Facility Based Crisis:  These are 3 locations for services: Please call before arrival    Address: [address] [place], [place] [address] Phone: [phone]   Address: [address] [place], [place] [address] Phone#: [phone]   Address: [address] [place], [place] [address] Phone#: [phone]     [facility] ([facility]):  (offers outpatient therapy and intensive outpatient substance abuse therapy).  [address], [place], [place] [address] Phone: [phone]   [facility]: Offers FREE recovery skills classes, support groups, 1:1 Peer Support, and Compeer Classes. [address] [place], [place] [address] Phone: [phone] (Call to complete intake).  [facility] [address] [place], [place] [address] Phone: [phone] ext: [phone] The [facility] provides food, shelter and other programs and services to the homeless men of [place] through our [employer].   By offering safe shelter, three meals a day, clean clothing, Biblical counseling, financial planning, vocational training, GED/education and employment assistance, we've helped mend the shattered lives of many homeless men since opening in 1974.   We have approximately 267 beds available, with a max of 312 beds including mats for emergency situations and currently house an average of 270 men a night.   Prospective Client Check-In Information Photo ID Required (State/ Out of State/ [facility]) - if photo ID is not available,  clients are required to have a printout of a police/sheriff's criminal history report. Help out with chores around the [facility]. No sex offender of any type (pending, charged, registered and/or any other sex related offenses) will be permitted to check in. Must be willing to abide by all rules, regulations, and policies established by the [employer]. The following will be provided - shelter, food, clothing, and biblical counseling. If you or someone you know is in need of assistance at our [facility] shelter in [place], [place], please call [phone] ext. [phone].   [place] [facility] [facility]-will provide timely access to mental health services for children and adolescents (4-17) and adults presenting in a mental health crisis. The program is  designed for those who need urgent Behavioral Health or Substance Use treatment and are not experiencing a medical crisis that would typically require an emergency room visit.    [address] [place], [place] [address] Phone: [phone] [email or web address]   [facility]: Phone#: [phone]   The [facility] SA Intensive Outpatient Program (SAIOP) means structured individual and group addiction activities and services that are provided at an outpatient program designed to assist adult and adolescent consumers to begin recovery and learn skills for recovery maintenance. The [employer] SAIOP program is offered at least 3 hours a day, 3 days a week.SAIOP services shall include a structured program consisting of, but not limited to, the following services: Individual counseling and support; Group counseling and support; Family counseling, training or support; Biochemical assays to identify recent drug use (e.g., urine drug screens); Strategies for relapse prevention to include community and social support systems in treatment; Life skills; Crisis contingency planning; Disease Management; and Treatment support activities that have been adapted or specifically designed for persons with physical disabilities, or persons with co-occurring disorders of mental illness and substance abuse/dependence or mental retardation/developmental disability and substance abuse/dependence. Phone: [phone]  Address:    The [facility] will also offer the following outpatient services: (Monday through Friday 8am-5pm)    Partial Hospitalization Program (PHP)  Substance Abuse Intensive Outpatient Program (SA-IOP)  Group Therapy  Medication Management  Peer Living Room We also provide (24/7):  Assessments: Our mental health clinician and providers will conduct a focused mental health evaluation, assessing for immediate safety concerns and further mental health  needs. Referral: Our team will provide resources and help connect to community based mental health treatment, when indicated, including psychotherapy, psychiatry, and other specialized behavioral health or substance use disorder services (for those not already in treatment). Transitional Care: Our team providers in person bridging and/or telephonic follow-up during the patient's transition to outpatient services.   The [facility] 24-Hour Call Center: [phone]  Behavioral Health Crisis Line: [phone]           Follow-up Information    [facility] Follow up.   Specialty: Urgent Care Why: Open Access Hours for walk in: Monday - Thursday 8:00 to 11:00 AM for medication management and therapy intake.  Friday from 1:00 to 4:00 PM for therapy intake only Contact information: [address] [place] [address] [phone]              This service was provided via telemedicine using a 2-way, interactive audio and video technology.  Names of all persons participating in this telemedicine service and their role in this encounter. Name: Assunta Found Role: NP  Name: Dr. Earlene Plater Role: Psychiatrist  Name: Cherre Blanc [name] Role: Patient  Name: Fraser Din, RN Role: Patients nurse sent a secure message informing:  Patient has been seen and psychiatrically cleared. Patient was requesting long-term rehab services and able to contract for safety with rehab facility. Social work has contacted [employer] (Civil engineer, contracting) for [employer] (Outpatient resources [employer]). Social work will list walk in hours on patients AVS. Patient is able to go to [facility], but nursing will need to arrange transport. The earlier the patient gets there the better.  Please inform MD.      Assunta Found, NP 04/20/2021, 12:16 PM

## 2021-04-20 NOTE — ED Notes (Signed)
Pt changed into appropriate attire in triage, pt wanded by security

## 2021-04-20 NOTE — ED Notes (Signed)
Breakfast tray at beside; pt sleeping; sitter present

## 2021-04-20 NOTE — ED Notes (Signed)
Pt refused transport and states he has been to [facility] and does not want to go back. RN messaged NP as she does not a have a number in the chart.

## 2021-04-20 NOTE — BH Assessment (Addendum)
Comprehensive Clinical Assessment (CCA) Note  04/20/2021 Aaron [name] [id number]   Disposition: Assunta Found, NP tp see for potential discharge.   The patient demonstrates the following risk factors for suicide: Chronic risk factors for suicide include: psychiatric disorder of depression, substance use disorder and previous suicide attempts numerous. Acute risk factors for suicide include: unemployment, social withdrawal/isolation and recent discharge from inpatient psychiatry. Protective factors for this patient include: positive social support and NA. Considering these factors, the overall suicide risk at this point appears to be high. Patient is not appropriate for outpatient follow up.  Flowsheet Row ED from 04/19/2021 in [facility] EMERGENCY DEPARTMENT Admission (Discharged) [facility] from 04/07/2021 in [facility]  C-SSRS RISK CATEGORY High Risk Moderate Risk High Risk     Patient is a 45 year old presenting voluntarily to [facility] ED reporting suicidal ideation and alcohol abuse. He was d/c from [facility] on 5/23. He states upon d/c he discontinued his medications and began drinking again. He is currently staying with friends. Patient states yesterday he got a gun from a friend and was going to shoot himself but didn't do it. He states he is now thinking of crashing his car. He denies HI/AVH. He endorses daily alcohol use and denies using any other substances. Patient states he does not have anyone for TTS to contact for collateral information.  Chief Complaint:  Chief Complaint  Patient presents with  . Suicidal   Visit Diagnosis:    CCA Screening, Triage and Referral (STR)  Patient Reported Information How did you hear about [place]? Hospital Discharge  Referral name: No data recorded Referral phone number: No data recorded  Whom do you see for routine medical problems? I  don't have a doctor  Practice/Facility Name: No data recorded Practice/Facility Phone Number: No data recorded Name of Contact: No data recorded Contact Number: No data recorded Contact Fax Number: No data recorded Prescriber Name: No data recorded Prescriber Address (if known): No data recorded  What Is the Reason for Your Visit/Call Today? No data recorded How Long Has This Been Causing You Problems? 1 wk - 1 month  What Do You Feel Would Help You the Most Today? Treatment for Depression or other mood problem; Alcohol or Drug Use Treatment   Have You Recently Been in Any Inpatient Treatment (Hospital/Detox/Crisis Center/28-Day Program)? Yes  Name/Location of Program/Hospital:[facility]  How Long Were You There? UTA  When Were You Discharged? 04/12/2021   Have You Ever Received Services From [employer] Before? Yes  Who Do You [facility] at [facility]? [facility]   Have You Recently Had Any Thoughts About Hurting Yourself? Yes  Are You Planning to Commit Suicide/Harm Yourself At This time? Yes   Have you Recently Had Thoughts About Hurting Someone Karolee Ohs? No  Explanation: No data recorded  Have You Used Any Alcohol or Drugs in the Past 24 Hours? Yes  How Long Ago Did You Use Drugs or Alcohol? No data recorded What Did You Use and How Much? alcohol   Do You Currently Have a Therapist/Psychiatrist? No  Name of Therapist/Psychiatrist: No data recorded  Have You Been Recently Discharged From Any Office Practice or Programs? No  Explanation of Discharge From Practice/Program: No data recorded    CCA Screening Triage Referral Assessment Type of Contact: Face-to-Face  Is this Initial or Reassessment? No data recorded Date Telepsych consult ordered in [facility]:  No data recorded  Time Telepsych consult ordered in [facility]:  No data recorded  Patient Reported Information Reviewed? Yes  Patient Left Without Being Seen? No data recorded Reason for Not Completing Assessment: No data  recorded  Collateral Involvement: No data recorded  Does Patient Have a Court Appointed Legal Guardian? No data recorded Name and Contact of Legal Guardian: No data recorded If Minor and Not Living with Parent(s), Who has Custody? No data recorded Is CPS involved or ever been involved? Never  Is APS involved or ever been involved? Never   Patient Determined To Be At Risk for Harm To Self or Others Based on Review of Patient Reported Information or Presenting Complaint? Yes, for Self-Harm  Method: No data recorded Availability of Means: No data recorded Intent: No data recorded Notification Required: No data recorded Additional Information for Danger to Others Potential: No data recorded Additional Comments for Danger to Others Potential: No data recorded Are There Guns or Other Weapons in Your Home? No data recorded Types of Guns/Weapons: No data recorded Are These Weapons Safely Secured?                            No data recorded Who Could Verify You Are Able To Have These Secured: No data recorded Do You Have any Outstanding Charges, Pending Court Dates, Parole/Probation? No data recorded Contacted To Inform of Risk of Harm To Self or Others: Other: Comment   [facility] of Assessment: [facility] Assessment Services   Does Patient Present under Involuntary Commitment? No  IVC Papers Initial File Date: No data recorded  [place] of Residence: [place]   Patient Currently Receiving the Following Services: Not Receiving Services   Determination of Need: No data recorded  Options For Referral: Inpatient Hospitalization; Medication Management; [facility] Urgent Care; Outpatient Therapy     CCA Biopsychosocial Intake/Chief Complaint:  NA  Current Symptoms/Problems: NA   Patient Reported Schizophrenia/Schizoaffective Diagnosis in Past: No   Strengths: NA  Preferences: NA  Abilities: NA   Type of Services Patient Feels are Needed: NA   Initial Clinical Notes/Concerns:  NA   Mental Health Symptoms Depression:  Change in energy/activity; Difficulty Concentrating; Fatigue; Hopelessness; Increase/decrease in appetite; Sleep (too much or little); Irritability; Tearfulness; Weight gain/loss; Worthlessness   Duration of Depressive symptoms: Greater than two weeks   Mania:  None   Anxiety:   None   Psychosis:  None   Duration of Psychotic symptoms: No data recorded  Trauma:  None   Obsessions:  None   Compulsions:  None   Inattention:  Does not seem to listen   Hyperactivity/Impulsivity:  N/A   Oppositional/Defiant Behaviors:  N/A   Emotional Irregularity:  N/A   Other Mood/Personality Symptoms:  No data recorded   Mental Status Exam Appearance and self-care  Stature:  Average   Weight:  Average weight   Clothing:  Neat/clean   Grooming:  Normal   Cosmetic use:  None   Posture/gait:  Normal   Motor activity:  Not Remarkable   Sensorium  Attention:  Normal   Concentration:  Normal   Orientation:  X5   Recall/memory:  Normal   Affect and Mood  Affect:  Flat   Mood:  Depressed   Relating  Eye contact:  Avoided   Facial expression:  Depressed   Attitude toward examiner:  Guarded   Thought and Language  Speech flow: Clear and Coherent   Thought content:  Appropriate to Mood and Circumstances  Preoccupation:  None   Hallucinations:  None   Organization:  No data recorded  [employer] of Knowledge:  Fair   Intelligence:  Average   Abstraction:  Normal   Judgement:  Impaired   Reality Testing:  Realistic   Insight:  Lacking   Decision Making:  Impulsive   Social Functioning  Social Maturity:  Impulsive   Social Judgement:  Heedless   Stress  Stressors:  Housing; Illness; Financial   Coping Ability:  Deficient supports   Skill Deficits:  Decision making; Responsibility   Supports:  Support needed     Religion: Religion/Spirituality Are You A Religious Person?:  No  Leisure/Recreation: Leisure / Recreation Do You Have Hobbies?: No  Exercise/Diet: Exercise/Diet Do You Exercise?: No Have You Gained or Lost A Significant Amount of Weight in the Past Six Months?: No Do You Follow a Special Diet?: No Do You Have Any Trouble Sleeping?: No   CCA Employment/Education Employment/Work Situation: Employment / Work Situation Employment situation: Unemployed What is the longest time patient has a held a job?: 5 years 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 Has patient ever been in the [employer]?: No  Education: Education Is Patient Currently Attending School?: No Last Grade Completed: 12 Name of High School: [employer] Did Garment/textile technologist From [employer]?: Yes Did Theme park manager?: No Did Designer, television/film set?: No Did You Have An Individualized Education Program (IIEP): No Did You Have Any Difficulty At [employer]?: No Patient's Education Has Been Impacted by Current Illness: No   CCA Family/Childhood History Family and Relationship History: Family history Marital status: Long term relationship Long term relationship, how long?: 2 years What types of issues is patient dealing with in the relationship?: She gets into his business Are you sexually active?: Yes What is your sexual orientation?: Straight Does patient have children?: Yes How many children?: 1 How is patient's relationship with their children?: 10yo daughter - has a good relationship, sees her 4 times a month  Childhood History:  Childhood History By whom was/is the patient raised?: Both parents Description of patient's relationship with caregiver when they were a child: Basic upbringing, loving relationship with both parents Patient's description of current relationship with people who raised him/her: Father is deceased, mother and he have a "crazy" relationship. How were you disciplined when you got in trouble as a child/adolescent?: Not abused,  disciplined appropriately Does patient have siblings?: Yes Description of patient's current relationship with siblings: 1 full older brother - incarcerated, brings patient a lot of stress because he relies on patient so much; 1 half-brother who is younger - distant but cordial Did patient suffer any verbal/emotional/physical/sexual abuse as a child?: No Did patient suffer from severe childhood neglect?: No Has patient ever been sexually abused/assaulted/raped as an adolescent or adult?: No Was the patient ever a victim of a crime or a disaster?: No Witnessed domestic violence?: No Has patient been affected by domestic violence as an adult?: No  Child/Adolescent Assessment:     CCA Substance Use Alcohol/Drug Use: Alcohol / Drug Use Pain Medications: see MAR Prescriptions: see MAR Over the Counter: see MAR History of alcohol / drug use?: Yes Substance #1 Name of Substance 1: alcohol 1 - Age of First Use: UTA 1 - Amount (size/oz): "as much as I can get" 1 - Frequency: daily 1 - Duration: UTA 1 - Last Use / Amount: yesterday 1 - Method of Aquiring: purchased 1- Route of Use: drinking  ASAM's:  Six Dimensions of Multidimensional Assessment  Dimension 1:  Acute Intoxication and/or Withdrawal Potential:   Dimension 1:  Description of individual's past and current experiences of substance use and withdrawal: last drank yesterday  Dimension 2:  Biomedical Conditions and Complications:   Dimension 2:  Description of patient's biomedical conditions and  complications: none reported  Dimension 3:  Emotional, Behavioral, or Cognitive Conditions and Complications:  Dimension 3:  Description of emotional, behavioral, or cognitive conditions and complications: depression  Dimension 4:  Readiness to Change:  Dimension 4:  Description of Readiness to Change criteria: contemplative  Dimension 5:  Relapse, Continued use, or Continued Problem Potential:  Dimension 5:   Relapse, continued use, or continued problem potential critiera description: history of continued use after treatment  Dimension 6:  Recovery/Living Environment:  Dimension 6:  Recovery/Iiving environment criteria description: staying with different friends  ASAM Severity Score: ASAM's Severity Rating Score: 10  ASAM Recommended Level of Treatment: ASAM Recommended Level of Treatment: Level II Partial Hospitalization Treatment   Substance use Disorder (SUD) Substance Use Disorder (SUD)  Checklist Symptoms of Substance Use: Continued use despite having a persistent/recurrent physical/psychological problem caused/exacerbated by use,Continued use despite persistent or recurrent social, interpersonal problems, caused or exacerbated by use,Evidence of tolerance,Evidence of withdrawal (Comment),Large amounts of time spent to obtain, use or recover from the substance(s),Persistent desire or unsuccessful efforts to cut down or control use,Repeated use in physically hazardous situations,Recurrent use that results in a failure to fulfill major role obligations (work, school, home),Presence of craving or strong urge to use,Social, occupational, recreational activities given up or reduced due to use,Substance(s) often taken in larger amounts or over longer times than was intended  Recommendations for Services/Supports/Treatments: Recommendations for Services/Supports/Treatments Recommendations For Services/Supports/Treatments: Inpatient Hospitalization  DSM5 Diagnoses: Patient Active Problem List   Diagnosis Date Noted  . Alcohol withdrawal without perceptual disturbances (HCC) 04/08/2021  . Alcohol dependence (HCC) 04/08/2021  . Substance induced mood disorder (HCC) 04/08/2021  . 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

## 2021-04-20 NOTE — BH Assessment (Signed)
TTS complete. Dispo pending  

## 2021-04-20 NOTE — ED Notes (Signed)
Safe transport called 

## 2021-04-20 NOTE — ED Notes (Signed)
Breakfast order placed ?

## 2021-04-20 NOTE — ED Notes (Signed)
Belongings placed in locker #1 (1 bag); valuables given to security

## 2021-04-20 NOTE — ED Provider Notes (Addendum)
[facility] EMERGENCY DEPARTMENT Provider Note   CSN: [id number] Arrival date & time: 04/19/21  2349     History Chief Complaint  Patient presents with   Suicidal    Aaron [name] is a 45 y.o. male.  45 year old male with a history of alcohol dependence, substance-induced mood disorder, major depressive disorder presents to the emergency department complaining of persistent suicidal ideations x1 month.  SI became more pronounced yesterday.  He has been noncompliant with his medications.  States that he feels better when he is taking his psychiatric medications, but then he stops using them.  He cannot articulate why he has neglected to restart his medicines and cannot tell me who usually prescribes them for him.  No expressed suicidal plan.  No HI/AVH.  Did drink alcohol today.  Denies history of complicated EtOH withdrawal.  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     Family History  Problem Relation Age of Onset   Cancer Father     Social History   Tobacco Use   Smoking status: Current Every Day Smoker    Packs/day: 0.25    Types: Cigarettes   Smokeless tobacco: Never Used  Vaping Use   Vaping Use: Never used  Substance Use Topics   Alcohol use: Yes    Comment: heavy   Drug use: No    Home Medications Prior to Admission medications   Medication Sig Start Date End Date Taking? Authorizing Provider  acamprosate (CAMPRAL) 333 MG tablet Take 2 tablets (666 mg total) by mouth 2 (two) times daily with a meal. Patient not taking: Reported on 04/20/2021 04/12/21    Laveda Abbe, NP  escitalopram (LEXAPRO) 20 MG tablet Take 1 tablet (20 mg total) by mouth daily. Patient not taking: Reported on 04/20/2021 04/13/21   Laveda Abbe, NP  Multiple Vitamin (MULTIVITAMIN WITH MINERALS) TABS tablet Take 1 tablet by mouth daily. Patient not taking: Reported on 04/20/2021 04/13/21   Laveda Abbe, NP  thiamine 100 MG tablet Take 1 tablet (100 mg total) by mouth daily. Patient not taking: Reported on 04/20/2021 04/13/21   Laveda Abbe, NP  tolnaftate (TINACTIN) 1 % cream Apply topically 2 (two) times daily. Patient not taking: Reported on 04/20/2021 04/12/21   Laveda Abbe, NP  traZODone (DESYREL) 50 MG tablet Take 1 tablet (50 mg total) by mouth at bedtime. Patient not taking: Reported on 04/20/2021 04/12/21   Laveda Abbe, NP    Allergies    Patient has no known allergies.  Review of Systems   Review of Systems  Physical Exam Updated Vital Signs BP 117/65 (BP Location: Left Arm)   Pulse (!) 109   Temp 98 F (36.7 C) (Oral)   Resp 16   Ht 5\' 1"  (1.549 m)   Wt 72.6 kg   SpO2 94%   BMI 30.24 kg/m   Physical Exam Vitals and nursing note reviewed.  Constitutional:      General: He is not in acute distress.    Appearance: He is well-developed. He is not diaphoretic.  HENT:  Head: Normocephalic and atraumatic.  Eyes:     General: No scleral icterus.    Conjunctiva/sclera: Conjunctivae normal.  Pulmonary:     Effort: Pulmonary effort is normal. No respiratory distress.  Musculoskeletal:        General: Normal range of motion.     Cervical back: Normal range of motion.  Skin:    General: Skin is warm and dry.     Coloration: Skin is not pale.     Findings: No erythema or rash.  Neurological:     Mental Status: He is alert and oriented to person, place, and time.  Psychiatric:        Behavior: Behavior is withdrawn. Behavior is cooperative.        Thought Content: Thought content includes suicidal  ideation.    ED Results / Procedures / Treatments   Labs (all labs ordered are listed, but only abnormal results are displayed) Labs Reviewed  COMPREHENSIVE METABOLIC PANEL - Abnormal; Notable for the following components:      Result Value   Potassium 3.4 (*)    Glucose, Bld 108 (*)    Calcium 8.7 (*)    Albumin 3.4 (*)    All other components within normal limits  ETHANOL - Abnormal; Notable for the following components:   Alcohol, Ethyl (B) 108 (*)    All other components within normal limits  SALICYLATE LEVEL - Abnormal; Notable for the following components:   Salicylate Lvl <7.0 (*)    All other components within normal limits  ACETAMINOPHEN LEVEL - Abnormal; Notable for the following components:   Acetaminophen (Tylenol), Serum <10 (*)    All other components within normal limits  CBC - Abnormal; Notable for the following components:   Platelets 402 (*)    All other components within normal limits  RESP PANEL BY RT-PCR (FLU A&B, COVID) ARPGX2  RAPID URINE DRUG SCREEN, HOSP PERFORMED    EKG None  Radiology No results found.  Procedures Procedures   Medications Ordered in ED Medications  thiamine tablet 100 mg (has no administration in time range)  traZODone (DESYREL) tablet 50 mg (has no administration in time range)  escitalopram (LEXAPRO) tablet 20 mg (has no administration in time range)  acamprosate (CAMPRAL) tablet 666 mg (has no administration in time range)  thiamine tablet 100 mg (100 mg Oral Given 04/20/21 0316)    ED Course  I have reviewed the triage vital signs and the nursing notes.  Pertinent labs & imaging results that were available during my care of the patient were reviewed by me and considered in my medical decision making (see chart for details).    MDM Rules/Calculators/A&P                          45 year old male presents to the emergency department for worsening depression and suicidal ideations.  He has been medically cleared and  is pending TTS evaluation.  Disposition to be determined by oncoming ED provider.   Final Clinical Impression(s) / ED Diagnoses Final diagnoses:  Suicidal ideation  Alcohol abuse    Rx / DC Orders ED Discharge Orders     None        Antony Madura, PA-C 04/20/21 4401    Geoffery Lyons, MD 04/20/21 0616    Antony Madura, PA-C 05/08/21 0272    Geoffery Lyons, MD 05/08/21 [phone]

## 2021-04-20 NOTE — Discharge Instructions (Signed)
Alcohol Abuse and Dependence Information, Adult Alcohol is a widely available drug. People drink alcohol in different amounts. People who drink alcohol very often and in large amounts often have problems during and after drinking. They may develop what is called an alcohol use disorder. There are two main types of alcohol use disorders:  Alcohol abuse. This is when you use alcohol too much or too often. You may use alcohol to make yourself feel happy or to reduce stress. You may have a hard time setting a limit on the amount you drink.  Alcohol dependence. This is when you use alcohol consistently for a period of time, and your body changes as a result. This can make it hard to stop drinking because you may start to feel sick or feel different when you do not use alcohol. These symptoms are known as withdrawal. How can alcohol abuse and dependence affect me? Alcohol abuse and dependence can have a negative effect on your life. Drinking too much can lead to addiction. You may feel like you need alcohol to function normally. You may drink alcohol before work in the morning, during the day, or as soon as you get home from work in the evening. These actions can result in:  Poor work performance.  Job loss.  Financial problems.  Car crashes or criminal charges from driving after drinking alcohol.  Problems in your relationships with friends and family.  Losing the trust and respect of coworkers, friends, and family. Drinking heavily over a long period of time can permanently damage your body and brain, and can cause lifelong health issues, such as:  Damage to your liver or pancreas.  Heart problems, high blood pressure, or stroke.  Certain cancers.  Decreased ability to fight infections.  Brain or nerve damage.  Depression.  Early (premature) death. If you are careless or you crave alcohol, it is easy to drink more than your body can handle (overdose). Alcohol overdose is a serious  situation that requires hospitalization. It may lead to permanent injuries or death. What can increase my risk?  Having a family history of alcohol abuse.  Having depression or other mental health conditions.  Beginning to drink at an early age.  Binge drinking often.  Experiencing trauma, stress, and an unstable home life during childhood.  Spending time with people who drink often. What actions can I take to prevent or manage alcohol abuse and dependence?  Do not drink alcohol if: ? Your health care provider tells you not to drink. ? You are pregnant, may be pregnant, or are planning to become pregnant.  If you drink alcohol: ? Limit how much you use to:  0-1 drink a day for women.  0-2 drinks a day for men. ? Be aware of how much alcohol is in your drink. In the [place] one drink equals one 12 oz bottle of beer (355 mL), one 5 oz glass of wine (148 mL), or one 1 oz glass of hard liquor (44 mL).  Stop drinking if you have been drinking too much. This can be very hard to do if you are used to abusing alcohol. If you begin to have withdrawal symptoms, talk with your health care provider or a person that you trust. These symptoms may include anxiety, shaky hands, headache, nausea, sweating, or not being able to sleep.  Choose to drink nonalcoholic beverages in social gatherings and places where there may be alcohol. Activity  Spend more time on activities that you enjoy that  do not involve alcohol, like hobbies or exercise.  Find healthy ways to cope with stress, such as exercise, meditation, or spending time with people you care about. General information  Talk to your family, coworkers, and friends about supporting you in your efforts to stop drinking. If they drink, ask them not to drink around you. Spend more time with people who do not drink alcohol.  If you think that you have an alcohol dependency problem: ? Tell friends or family about your concerns. ? Talk with your  health care provider or another health professional about where to get help. ? Work with a Paramedic and a Network engineer. ? Consider joining a support group for people who struggle with alcohol abuse and dependence. Where to find support  Your health care provider.  SMART Recovery: www.smartrecovery.org Therapy and support groups  Local treatment centers or chemical dependency counselors.  Local AA groups in your community: SalaryStart.tn   Where to find more information  Centers for Disease Control and Prevention: FootballExhibition.com.br  [employer] on Alcohol Abuse and Alcoholism: BasicStudents.dk  Alcoholics Anonymous (AA): SalaryStart.tn Contact a health care provider if:  You drank more or for longer than you intended on more than one occasion.  You tried to stop drinking or to cut back on how much you drink, but you were not able to.  You often drink to the point of vomiting or passing out.  You want to drink so badly that you cannot think about anything else.  You have problems in your life due to drinking, but you continue to drink.  You keep drinking even though you feel anxious, depressed, or have experienced memory loss.  You have stopped doing the things you used to enjoy in order to drink.  You have to drink more than you used to in order to get the effect you want.  You experience anxiety, sweating, nausea, shakiness, and trouble sleeping when you try to stop drinking. Get help right away if:  You have thoughts about hurting yourself or others.  You have serious withdrawal symptoms, including: ? Confusion. ? Racing heart. ? High blood pressure. ? Fever. If you ever feel like you may hurt yourself or others, or have thoughts about taking your own life, get help right away. You can go to your nearest emergency department or call:  Your local emergency services (911 in the [place]  A suicide crisis helpline, such as the National Suicide Prevention  Lifeline at 671-319-9045. This is open 24 hours a day. Summary  Alcohol abuse and dependence can have a negative effect on your life. Drinking too much or too often can lead to addiction.  If you drink alcohol, limit how much you use.  If you are having trouble keeping your drinking under control, find ways to change your behavior. Hobbies, calming activities, exercise, or support groups can help.  If you feel you need help with changing your drinking habits, talk with your health care provider, a good friend, or a therapist, or go to an AA group. This information is not intended to replace advice given to you by your health care provider. Make sure you discuss any questions you have with your health care provider. Document Revised: 02/26/2019 Document Reviewed: 01/15/2019 Elsevier Patient Education  2021 Elsevier Inc.   Finding Treatment for Addiction Addiction is a complex disease of the brain that causes an uncontrollable (compulsive) need for:  A substance. This includes alcohol, illegal drugs, or prescription medicines, such as painkillers.  An activity or behavior, such as gambling or shopping. Addiction changes the way your brain works. Because of this change:  The need for the medicine, drug, or activity can become so strong that you think about it all the time.  Getting more and more of your addiction becomes the most important thing to you.  You may find yourself leaving other activities and relationships to pursue your addiction.  You can become physically dependent on a substance.  Your health, behavior, emotions, and relationships can change for the worse. How do I know if I need treatment for addiction? Addiction is a progressive disease. Without treatment, addiction can get worse. Living with addiction puts you at higher risk for injury, poor health, loss of employment, loss of money, and even death. You might need treatment for addiction if:  You have tried to  stop or cut down, but you have not succeeded.  You find it annoying that your friends and family are concerned about your use or behavior.  You feel guilty about your use or behavior.  You need a particular substance or activity to start your day or to calm down.  You are running out of money because of your addiction.  You have done something illegal to support your addiction.  Your addiction has caused you: ? Health problems. ? Trouble in school, work, home, or with the police. ? To devote all your time to your addiction, and not to other responsibilities. ? To tell lies in order to hide your problem. What types of treatment are available? There may be options for treatment programs and plans based on your addiction, condition, needs, and preferences. No single treatment is right for everyone.  Treatment programs can be: ? Outpatient. You live at home and go to work or school, but you go to a clinic for treatment. ? Inpatient. You live and sleep at the program facility during treatment.  Programs may include: ? Medicine. You may need medicine to treat the addiction itself, or to treat anxiety or depression. ? Counseling and behavior therapy. This can help individuals and families behave in healthier ways and relate more effectively. ? Support groups. Confidential group therapy, such as a 12-step program, can help individuals and families during treatment and recovery. ? A combination of education, counseling, and a 12-step, spirituality-based approach.   What should I consider when selecting a treatment program? Think about your individual requirements when selecting a treatment program. Ask about:  The overall approach to treatment. ? Some programs are strictly 12-step programs. Some have a more flexible approach. ? Programs may differ in length of stay, setting, and size. ? Some programs include your family in your treatment plan. Support may be offered to them throughout the  treatment process, as well as instructions for them when you are discharged. ? You may continue to receive support after you have left the program.  The types of medical services that are offered. Find out if the program: ? Offers specific treatment for your particular addiction. ? Meets all of your needs, including physical and cultural needs. ? Includes any medicines you might need. ? Offers mental health counseling as part of your treatment. ? Offers the 12-step meetings at the center, or if transport is available for patients to attend meetings at other locations.  The cost and types of insurance that are accepted. ? Some programs are sponsored by the government. They support patients who do not have private insurance. ? If you do not have  insurance, or if you choose to attend a program that does not accept your insurance, call the treatment center. Tell them your financial needs and whether a payment plan can be set up. ? There are also organizations that will help you find the resources for treatment. You can find them online by searching "treatment for addiction."  If the program is certified by the appropriate government agency. Where to find support  Your health care provider can help you to find the right treatment. These discussions are confidential.  The [employer] on Alcoholism and Drug Dependence (NCADD). This group has information about treatment centers and programs for people who have an addiction and for family members. ? Call: 1-800-NCA-CALL (671-199-99731-(404)361-7759). ? Visit the website: https://www.ncadd.org/  The Substance Abuse and Mental Health Services Administration [facility](SAMHSA). This organization will help you find publicly funded treatment centers, help hotlines, and counseling services near you. ? Call: 1-800-662-HELP ((223)648-22491-(405)044-2863). ? Visit the website: www.findtreatment.RockToxic.plsamhsa.gov  The National Problem Gambling Helpline. This is a 24-hour confidential helpline  for gambling addiction. ? Call: 517-630-65481-817-565-1428 ? Visit the website: CocoaInvestor.tnhttps://www.ncpgambling.org/ In countries outside of the [place] and [place], look in [employer]local directories for contact information for services in your area. Follow these instructions at home:  Find supportive people who will help you stay away from your addiction and stay sober.  Do not use the substance or engage in the activity.  If you have been through treatment: ? Follow your plan. The plan is usually developed by you and your health care provider during treatment. ? Go to meetings with other people in recovery. ? Avoid people, situations, and things that lead you to do the things you are addicted to (triggers). Summary  Addiction changes the way your brain works. These changes cause a desire to repeat and increase the use of the a substance or behavior.  Addiction is a progressive disease. Without treatment, addiction can get worse. Living with addiction puts you at higher risk for injury, poor health, loss of employment, loss of money, and even death.  There may be options for treatment programs and plans based on your addiction, condition, needs, and preferences. No single treatment is right for everyone.  Your health care provider can help you to find the right treatment. These discussions are confidential. This information is not intended to replace advice given to you by your health care provider. Make sure you discuss any questions you have with your health care provider. Document Revised: 07/31/2019 Document Reviewed: 12/06/2017 Elsevier Patient Education  2021 Elsevier Inc. [facility]  [address] [place], [place] [id number] [facility] provides food, shelter and other programs and services to the homeless men of [place] through our [employer] [facility] program. By offering safe shelter, three meals a day, clean clothing, Biblical counseling,  financial planning, vocational training, GED/education and employment assistance, we've helped mend the shattered lives of many homeless men since opening in 1974. We have 388 beds available with an additional 88 mats for emergency situations. Prospective Client Check-In Information . Must provide a photo I.D. within 30 days of your acceptance into the [facility] program. . Help with chores around the Mission. . No sex offender of any type (pending, charged, registered and/or any other sex related offenses) will be permitted to check in. . Must be willing to abide by all rules, regulations, and policies established by the [facility]. . The following will be provided - shelter, food, clothing, and  biblical counseling. If you or someone you know is in need of assistance at our [facility] shelter in [place], [place], please call [phone] ext. [phone]. The [employer] [facility] [employer] is the [employer] 56-month recovery program designed to provide holistic restoration for addicted men and women. Through addiction counseling and other related programs, participants will be able to return to the workforce and become a contributing member of society once again.   Other Rehab Facilities Substance Abuse Resources  [facility] - Admissions are currently completed Monday through Friday at 8am; both appointments and walk-ins are accepted.  Any individual that is a [facility] resident may present for a substance abuse screening and assessment for admission.  A person may be referred by numerous sources or self-refer.   Potential clients will be screened for medical necessity and appropriateness for the program.  Clients must meet criteria for high-intensity residential treatment services.  If clinically appropriate, a client will continue with the comprehensive clinical assessment and intake process, as well as enrollment in the [facility].   Address: [address] [place], [place] [address] Admin Hours: Mon-Fri 8AM to [facility] Hours: 24/7 Phone: [phone] Fax: [phone]   [facility] (Detox) Facility Based Crisis:  These are 3 locations for services: Please call before arrival    Address: [address] [place], [place] [address] Phone: [phone]   Address: [address] [place], [place] [address] Phone#: [phone]   Address: [address] [place], [place] [address] Phone#: [phone]     [facility] ([facility]): (offers outpatient therapy and intensive outpatient substance abuse therapy).  [address] [place], [place] [address] Phone: [phone]   [facility] [place]: Offers FREE recovery skills classes, support groups, 1:1 Peer Support, and Compeer Classes. [address] [place], [place] [address] Phone: [phone] (Call to complete intake).  [facility] [address] [place], [place] [address] Phone: [phone] ext: [phone] The [facility] provides food, shelter and other programs and services to the homeless men of [place] through our [employer].   By offering safe shelter, three meals a day, clean clothing, Biblical counseling, financial planning, vocational training, GED/education and employment assistance, we've helped mend the shattered lives of many homeless men since opening in 1974.   We have approximately 267 beds available, with a max of 312 beds including mats for emergency situations and currently house an average of 270 men a night.   Prospective Client Check-In Information Photo ID Required (State/ Out of State/ [facility]) - if photo ID is not available, clients are required to have a printout of a police/sheriff's criminal history report. Help out with chores around the Mission. No sex offender of any type (pending, charged, registered and/or any other sex related offenses) will be  permitted to check in. Must be willing to abide by all rules, regulations, and policies established by the [employer]. The following will be provided - shelter, food, clothing, and biblical counseling. If you or someone you know is in need of assistance at our [facility] shelter in [place], [place], please call [phone] ext. [phone].   [facility]will provide timely access to mental health services for children and adolescents (4-17) and adults presenting in a mental health crisis. The program is designed for those who need urgent Behavioral Health or Substance Use treatment and are not experiencing a medical  crisis that would typically require an emergency room visit.    [address] [place], [place] [address] Phone: [phone] [email or web address]   [facility]: Phone#: [phone]   The [facility] SA Intensive Outpatient Program (SAIOP) means structured individual and group addiction activities and services that are provided at an outpatient program designed to assist adult and adolescent consumers to begin recovery and learn skills for recovery maintenance. The [employer] SAIOP program is offered at least 3 hours a day, 3 days a week.SAIOP services shall include a structured program consisting of, but not limited to, the following services: Individual counseling and support; Group counseling and support; Family counseling, training or support; Biochemical assays to identify recent drug use (e.g., urine drug screens); Strategies for relapse prevention to include community and social support systems in treatment; Life skills; Crisis contingency planning; Disease Management; and Treatment support activities that have been adapted or specifically designed for persons with physical disabilities, or persons with co-occurring disorders of mental illness and substance abuse/dependence or mental retardation/developmental  disability and substance abuse/dependence. Phone: [phone]  Address:    [facility] will also offer the following outpatient services: (Monday through Friday 8am-5pm)    Partial Hospitalization Program (PHP)  Substance Abuse Intensive Outpatient Program (SA-IOP)  Group Therapy  Medication Management  Peer Living Room We also provide (24/7):  Assessments: Our mental health clinician and providers will conduct a focused mental health evaluation, assessing for immediate safety concerns and further mental health needs. Referral: Our team will provide resources and help connect to community based mental health treatment, when indicated, including psychotherapy, psychiatry, and other specialized behavioral health or substance use disorder services (for those not already in treatment). Transitional Care: Our team providers in person bridging and/or telephonic follow-up during the patient's transition to outpatient services.   The [facility] 24-Hour Call Center: [phone]  Behavioral Health Crisis Line: [phone]

## 2021-04-20 NOTE — ED Notes (Signed)
TTS in process 

## 2021-04-21 NOTE — ED Notes (Signed)
Belongings from purple locker 1 returned to patient as well as items from security

## 2021-05-04 ENCOUNTER — Emergency Department (HOSPITAL_COMMUNITY)
Admission: EM | Admit: 2021-05-04 | Discharge: 2021-05-04 | Disposition: A | Discharge status: Home / Self Care | Payer: Self-pay | Attending: Emergency Medicine | Admitting: Emergency Medicine

## 2021-05-04 ENCOUNTER — Encounter (HOSPITAL_COMMUNITY): Payer: Self-pay | Admitting: *Deleted

## 2021-05-04 DIAGNOSIS — R55 Syncope and collapse: Secondary | ICD-10-CM | POA: Insufficient documentation

## 2021-05-04 DIAGNOSIS — R519 Headache, unspecified: Secondary | ICD-10-CM | POA: Insufficient documentation

## 2021-05-04 DIAGNOSIS — R454 Irritability and anger: Secondary | ICD-10-CM | POA: Insufficient documentation

## 2021-05-04 DIAGNOSIS — F17210 Nicotine dependence, cigarettes, uncomplicated: Secondary | ICD-10-CM | POA: Insufficient documentation

## 2021-05-04 DIAGNOSIS — Y907 Blood alcohol level of 200-239 mg/100 ml: Secondary | ICD-10-CM | POA: Insufficient documentation

## 2021-05-04 LAB — COMPREHENSIVE METABOLIC PANEL
ALT: 31 U/L (ref 0–44)
AST: 32 U/L (ref 15–41)
Albumin: 3.1 g/dL — ABNORMAL LOW (ref 3.5–5.0)
Alkaline Phosphatase: 52 U/L (ref 38–126)
Anion gap: 11 (ref 5–15)
BUN: 9 mg/dL (ref 6–20)
CO2: 24 mmol/L (ref 22–32)
Calcium: 7.9 mg/dL — ABNORMAL LOW (ref 8.9–10.3)
Chloride: 106 mmol/L (ref 98–111)
Creatinine, Ser: 0.78 mg/dL (ref 0.61–1.24)
GFR, Estimated: >60 mL/min (ref >60)
Glucose, Bld: 128 mg/dL — ABNORMAL HIGH (ref 70–99)
Potassium: 3.4 mmol/L — ABNORMAL LOW (ref 3.5–5.1)
Sodium: 141 mmol/L (ref 135–145)
Total Bilirubin: 0.2 mg/dL — ABNORMAL LOW (ref 0.3–1.2)
Total Protein: 6.2 g/dL — ABNORMAL LOW (ref 6.5–8.1)

## 2021-05-04 LAB — CBC
HCT: 43.7 % (ref 39.0–52.0)
Hemoglobin: 14.1 g/dL (ref 13.0–17.0)
MCH: 29.8 pg (ref 26.0–34.0)
MCHC: 32.3 g/dL (ref 30.0–36.0)
MCV: 92.4 fL (ref 80.0–100.0)
Platelets: 388 10*3/uL (ref 150–400)
RBC: 4.73 MIL/uL (ref 4.22–5.81)
RDW: 15.1 % (ref 11.5–15.5)
WBC: 5.6 10*3/uL (ref 4.0–10.5)
nRBC: 0 % (ref 0.0–0.2)

## 2021-05-04 LAB — ETHANOL: Alcohol, Ethyl (B): 220 mg/dL — ABNORMAL HIGH (ref <10)

## 2021-05-04 NOTE — ED Triage Notes (Signed)
Pt reports feeling very weak for two days and feels like he is going to pass out. Pt is hypotensive at triage. Denies any other symptoms such as n/v/d, fever, cough.

## 2021-05-04 NOTE — ED Notes (Signed)
Pt discharged and ambulated out of the ED without difficulty. 

## 2021-05-04 NOTE — ED Provider Notes (Signed)
[facility] EMERGENCY DEPARTMENT Provider Note   CSN: [id number] Arrival date & time: 05/04/21  0153     History Chief Complaint  Patient presents with   Near Syncope    Erie Radu III is a 45 y.o. male.  When asking about patient's reason for being here he stated he thinks he's dehydrated. After waking him up multiple times he then states that he has a 'fucking headache'. I ask him what else is going on and he said 'I already fucking told you, stop cussing at me' and refused to give any more history.    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 No pertinent surgical history.     Family History  Problem Relation Age of Onset   Cancer Father     Social History   Tobacco Use   Smoking status: Every Day    Packs/day: 0.25    Pack years: 0.00    Types: Cigarettes   Smokeless tobacco: Never  Vaping Use   Vaping Use: Never used  Substance Use Topics   Alcohol use: Yes    Comment: heavy   Drug use: No    Home Medications Prior to Admission medications   Not on File    Allergies    Patient has no known allergies.  Review of Systems   Review of Systems  Cardiovascular:  Positive for near-syncope.  All other systems reviewed and are negative.  Physical Exam Updated Vital Signs BP 92/63 (BP Location: Right Arm)   Pulse (!) 107   Temp 98.6 F (37 C) (Oral)   Resp 20   SpO2 96%   Physical Exam Vitals and nursing note reviewed.  Constitutional:      Appearance: He is well-developed.  HENT:     Head:  Normocephalic and atraumatic.     Mouth/Throat:     Mouth: Mucous membranes are moist.     Pharynx: Oropharynx is clear.  Cardiovascular:     Rate and Rhythm: Normal rate.  Pulmonary:     Effort: Pulmonary effort is normal. No respiratory distress.  Abdominal:     General: Abdomen is flat. There is no distension.  Musculoskeletal:        General: No swelling or tenderness. Normal range of motion.     Cervical back: Normal range of motion.  Skin:    General: Skin is warm and dry.     Coloration: Skin is not jaundiced or pale.  Neurological:     General: No focal deficit present.     Mental Status: He is alert.    ED Results / Procedures / Treatments   Labs (all labs ordered are listed, but only abnormal results are displayed) Labs Reviewed  CBC  COMPREHENSIVE METABOLIC PANEL  ETHANOL    EKG EKG Interpretation  Date/Time:  Tuesday May 04 2021 02:09:55 EDT Ventricular Rate:  98 PR Interval:  150 QRS Duration: 88 QT Interval:  332 QTC Calculation: 423 R Axis:   45 Text Interpretation: Normal sinus rhythm Nonspecific T wave abnormality Abnormal ECG ECG is  very similar to January, esp in reference to TWI in inferior and lateral leads Confirmed by Marily Memos [phone]) on 05/04/2021 2:21:54 AM  Radiology No results found.  Procedures Procedures   Medications Ordered in ED Medications - No data to display  ED Course  I have reviewed the triage vital signs and the nursing notes.  Pertinent labs & imaging results that were available during my care of the patient were reviewed by me and considered in my medical decision making (see chart for details).    MDM Rules/Calculators/A&P                          May be dehydrated? May not be. VS are mostly WNL (HR improved while sleeping). He is able to wake up and cuss me out but refuses to comply with any other history or physical exam. As he is threatening and refusing medical exam, he will be discharged.   Of note, when  he spoke to charge nurse he stated that he never cussed or got angry. He then proceeded to cuss out charge nurse when she told him he was discharged. It was also at this time that he apparently alleged we were racist and that is why we wouldn't treat him. He was screened for medical conditions and was discharged and able to walk out without incident.  Final Clinical Impression(s) / ED Diagnoses Final diagnoses:  Difficulty controlling anger    Rx / DC Orders ED Discharge Orders     None        Janazia Schreier, Barbara Cower, MD 05/04/21 2308

## 2021-05-04 NOTE — ED Provider Notes (Signed)
Emergency Medicine Provider Triage Evaluation Note  Lyam Provencio III , a 45 y.o. male  was evaluated in triage.  Pt complains of generalized weakness for the past 1-2 days. Constant, worse with activity. Feels lightheaded with position changes, no syncope. Has been drinking "a lot" the past 2 days, and "too much" tonight. Has had some water, poor PO intake overall.   Review of Systems  Positive: Lightheadedness, weakness Negative: Chest pain, vomiting, diarrhea, syncope  Physical Exam  BP 92/63 (BP Location: Right Arm)   Pulse (!) 107   Temp 98.6 F (37 C) (Oral)   Resp 20   SpO2 96%  Gen:   No distress Resp:  Normal effort  MSK:   Moves extremities without difficulty   Medical Decision Making  Medically screening exam initiated at 2:14 AM.  Appropriate orders placed.  Nelida Meuse III was informed that the remainder of the evaluation will be completed by another provider, this initial triage assessment does not replace that evaluation, and the importance of remaining in the ED until their evaluation is complete.  Lightheadedness.    Desmond Lope 05/04/21 0215    Marily Memos, MD 05/04/21 [phone]

## 2021-05-10 ENCOUNTER — Encounter (HOSPITAL_COMMUNITY): Payer: Self-pay

## 2021-05-10 ENCOUNTER — Emergency Department (HOSPITAL_COMMUNITY)
Admission: EM | Admit: 2021-05-10 | Discharge: 2021-05-10 | Disposition: A | Discharge status: Home / Self Care | Payer: Self-pay | Attending: Emergency Medicine | Admitting: Emergency Medicine

## 2021-05-10 ENCOUNTER — Other Ambulatory Visit: Payer: Self-pay

## 2021-05-10 DIAGNOSIS — F17210 Nicotine dependence, cigarettes, uncomplicated: Secondary | ICD-10-CM | POA: Insufficient documentation

## 2021-05-10 DIAGNOSIS — F10929 Alcohol use, unspecified with intoxication, unspecified: Secondary | ICD-10-CM | POA: Insufficient documentation

## 2021-05-10 DIAGNOSIS — Y900 Blood alcohol level of less than 20 mg/100 ml: Secondary | ICD-10-CM | POA: Insufficient documentation

## 2021-05-10 DIAGNOSIS — Z28310 Unvaccinated for covid-19: Secondary | ICD-10-CM | POA: Insufficient documentation

## 2021-05-10 DIAGNOSIS — Z20822 Contact with and (suspected) exposure to covid-19: Secondary | ICD-10-CM | POA: Insufficient documentation

## 2021-05-10 DIAGNOSIS — R197 Diarrhea, unspecified: Secondary | ICD-10-CM | POA: Insufficient documentation

## 2021-05-10 DIAGNOSIS — R110 Nausea: Secondary | ICD-10-CM | POA: Insufficient documentation

## 2021-05-10 DIAGNOSIS — R519 Headache, unspecified: Secondary | ICD-10-CM | POA: Insufficient documentation

## 2021-05-10 LAB — CBC WITH DIFFERENTIAL/PLATELET
Abs Immature Granulocytes: 0.02 10*3/uL (ref 0.00–0.07)
Basophils Absolute: 0.1 10*3/uL (ref 0.0–0.1)
Basophils Relative: 1 %
Eosinophils Absolute: 0.3 10*3/uL (ref 0.0–0.5)
Eosinophils Relative: 4 %
HCT: 50.1 % (ref 39.0–52.0)
Hemoglobin: 16.2 g/dL (ref 13.0–17.0)
Immature Granulocytes: 0 %
Lymphocytes Relative: 27 %
Lymphs Abs: 1.9 10*3/uL (ref 0.7–4.0)
MCH: 29.9 pg (ref 26.0–34.0)
MCHC: 32.3 g/dL (ref 30.0–36.0)
MCV: 92.6 fL (ref 80.0–100.0)
Monocytes Absolute: 0.6 10*3/uL (ref 0.1–1.0)
Monocytes Relative: 8 %
Neutro Abs: 4.4 10*3/uL (ref 1.7–7.7)
Neutrophils Relative %: 60 %
Platelets: 337 10*3/uL (ref 150–400)
RBC: 5.41 MIL/uL (ref 4.22–5.81)
RDW: 15 % (ref 11.5–15.5)
WBC: 7.2 10*3/uL (ref 4.0–10.5)
nRBC: 0 % (ref 0.0–0.2)

## 2021-05-10 LAB — RESP PANEL BY RT-PCR (FLU A&B, COVID) ARPGX2
Influenza A by PCR: NEGATIVE
Influenza B by PCR: NEGATIVE
SARS Coronavirus 2 by RT PCR: NEGATIVE

## 2021-05-10 LAB — COMPREHENSIVE METABOLIC PANEL
ALT: 25 U/L (ref 0–44)
AST: 25 U/L (ref 15–41)
Albumin: 3.7 g/dL (ref 3.5–5.0)
Alkaline Phosphatase: 62 U/L (ref 38–126)
Anion gap: 7 (ref 5–15)
BUN: 11 mg/dL (ref 6–20)
CO2: 29 mmol/L (ref 22–32)
Calcium: 8.8 mg/dL — ABNORMAL LOW (ref 8.9–10.3)
Chloride: 100 mmol/L (ref 98–111)
Creatinine, Ser: 0.77 mg/dL (ref 0.61–1.24)
GFR, Estimated: >60 mL/min (ref >60)
Glucose, Bld: 102 mg/dL — ABNORMAL HIGH (ref 70–99)
Potassium: 4.5 mmol/L (ref 3.5–5.1)
Sodium: 136 mmol/L (ref 135–145)
Total Bilirubin: 0.6 mg/dL (ref 0.3–1.2)
Total Protein: 7.4 g/dL (ref 6.5–8.1)

## 2021-05-10 LAB — ETHANOL: Alcohol, Ethyl (B): <10 mg/dL (ref <10)

## 2021-05-10 MED ORDER — SODIUM CHLORIDE 0.9 % IV BOLUS
1000.0000 mL | Freq: Once | INTRAVENOUS | Status: AC
  Administered 2021-05-10: 1000 mL via INTRAVENOUS

## 2021-05-10 MED ORDER — PROCHLORPERAZINE EDISYLATE 10 MG/2ML IJ SOLN
10.0000 mg | Freq: Once | INTRAMUSCULAR | Status: AC
  Administered 2021-05-10: 10 mg via INTRAVENOUS
  Filled 2021-05-10: qty 2

## 2021-05-10 MED ORDER — KETOROLAC TROMETHAMINE 15 MG/ML IJ SOLN
15.0000 mg | Freq: Once | INTRAMUSCULAR | Status: AC
  Administered 2021-05-10: 15 mg via INTRAVENOUS
  Filled 2021-05-10: qty 1

## 2021-05-10 NOTE — Discharge Instructions (Addendum)
As discussed, your evaluation today has been largely reassuring.  But, it is important that you monitor your condition carefully, and do not hesitate to return to the ED if you develop new, or concerning changes in your condition.  Be sure to stay well-hydrated, and use Tylenol and ibuprofen for headache control.  Otherwise, please follow-up with your physician for appropriate ongoing care.

## 2021-05-10 NOTE — ED Triage Notes (Signed)
Patient c/o headache and  nausea x 2 days.

## 2021-05-10 NOTE — ED Provider Notes (Signed)
[facility]-EMERGENCY DEPT Provider Note   CSN: [id number] Arrival date & time: 05/10/21  1038     History Chief Complaint  Patient presents with   Headache   Nausea    Aaron [name] is a 45 y.o. male.  HPI Patient presents with concern of headache, fatigue, nausea. Onset was 2 days ago, no obvious precipitant. Headache is diffuse, persistent, without new vision changes.  There is associated nausea, but no vomiting, no persistent diarrhea. Patient did not receive his COVID vaccines. Since onset no relief with OTC medication. He notes that he smokes occasionally, drinks alcohol.     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 No pertinent surgical history.     Family History  Problem Relation Age of Onset   Cancer Father     Social History   Tobacco Use   Smoking status: Every Day    Packs/day: 0.25    Pack years: 0.00    Types: Cigarettes   Smokeless tobacco: Never  Vaping Use   Vaping Use: Never used  Substance Use Topics   Alcohol use: Yes    Comment: heavy   Drug use: No    Home Medications Prior to Admission medications   Not on File    Allergies    Patient has no known allergies.  Review of Systems   Review of Systems  Constitutional:        Per HPI, otherwise negative  HENT:         Per HPI, otherwise negative  Respiratory:         Per HPI, otherwise negative  Cardiovascular:        Per HPI, otherwise negative  Gastrointestinal:  Positive for nausea. Negative for vomiting.  Endocrine:       Negative aside from  HPI  Genitourinary:        Neg aside from HPI   Musculoskeletal:        Per HPI, otherwise negative  Skin: Negative.   Neurological:  Positive for headaches. Negative for syncope.   Physical Exam Updated Vital Signs BP 113/77   Pulse 66   Temp 99.2 F (37.3 C) (Oral)   Resp 16   Ht 5' (1.524 m)   Wt 75.8 kg   SpO2 100%   BMI 32.61 kg/m   Physical Exam Vitals and nursing note reviewed.  Constitutional:      General: He is not in acute distress.    Appearance: He is well-developed.  HENT:     Head: Normocephalic and atraumatic.  Eyes:     Conjunctiva/sclera: Conjunctivae normal.  Cardiovascular:     Rate and Rhythm: Normal rate and regular rhythm.  Pulmonary:     Effort: Pulmonary effort is normal. No respiratory distress.     Breath sounds: No stridor.  Abdominal:     General: There is no distension.  Musculoskeletal:     Cervical back: No rigidity.  Skin:    General: Skin is warm and dry.  Neurological:     Mental Status: He is alert and oriented to person, place, and time.  Cranial Nerves: No cranial nerve deficit.  Psychiatric:        Mood and Affect: Mood normal.        Behavior: Behavior normal.    ED Results / Procedures / Treatments   Labs (all labs ordered are listed, but only abnormal results are displayed) Labs Reviewed  COMPREHENSIVE METABOLIC PANEL - Abnormal; Notable for the following components:      Result Value   Glucose, Bld 102 (*)    Calcium 8.8 (*)    All other components within normal limits  RESP PANEL BY RT-PCR (FLU A&B, COVID) ARPGX2  ETHANOL  CBC WITH DIFFERENTIAL/PLATELET     Procedures Procedures   Medications Ordered in ED Medications  sodium chloride 0.9 % bolus 1,000 mL (1,000 mLs Intravenous New Bag/Given 05/10/21 1244)  prochlorperazine (COMPAZINE) injection 10 mg (10 mg Intravenous Given 05/10/21 1243)  ketorolac (TORADOL) 15 MG/ML injection 15 mg (15 mg Intravenous Given 05/10/21 1244)    ED Course  I have  reviewed the triage vital signs and the nursing notes.  Pertinent labs & imaging results that were available during my care of the patient were reviewed by me and considered in my medical decision making (see chart for details).   2:30 PM On repeat exam the patient is awake, alert, sitting upright, in no distress.  Headache is improved, though not resolved. Have reviewed his labs, negative COVID test, labs essentially reassuring.  With no focal neurologic deficit, no hypertension, low suspicion for acute bleed.  Patient notes a history of headaches, some suspicion for recurrent migraine.  Given his improvement here, absence of hemodynamic instability, with reassuring labs, patient discharged in stable condition to follow-up with primary care. MDM Rules/Calculators/A&P  Final Clinical Impression(s) / ED Diagnoses Final diagnoses:  Bad headache     Gerhard Munch, MD 05/10/21 1432

## 2021-07-07 ENCOUNTER — Emergency Department (HOSPITAL_COMMUNITY): Payer: Self-pay

## 2021-07-07 ENCOUNTER — Emergency Department (HOSPITAL_COMMUNITY)
Admission: EM | Admit: 2021-07-07 | Discharge: 2021-07-07 | Disposition: A | Discharge status: Home / Self Care | Payer: Self-pay | Attending: Emergency Medicine | Admitting: Emergency Medicine

## 2021-07-07 ENCOUNTER — Encounter (HOSPITAL_COMMUNITY): Payer: Self-pay | Admitting: Emergency Medicine

## 2021-07-07 ENCOUNTER — Other Ambulatory Visit: Payer: Self-pay

## 2021-07-07 DIAGNOSIS — R0602 Shortness of breath: Secondary | ICD-10-CM | POA: Insufficient documentation

## 2021-07-07 DIAGNOSIS — R079 Chest pain, unspecified: Secondary | ICD-10-CM | POA: Insufficient documentation

## 2021-07-07 DIAGNOSIS — Z5321 Procedure and treatment not carried out due to patient leaving prior to being seen by health care provider: Secondary | ICD-10-CM | POA: Insufficient documentation

## 2021-07-07 LAB — CBC WITH DIFFERENTIAL/PLATELET
Abs Immature Granulocytes: 0.01 10*3/uL (ref 0.00–0.07)
Basophils Absolute: 0.1 10*3/uL (ref 0.0–0.1)
Basophils Relative: 1 %
Eosinophils Absolute: 0.2 10*3/uL (ref 0.0–0.5)
Eosinophils Relative: 3 %
HCT: 43.8 % (ref 39.0–52.0)
Hemoglobin: 14.3 g/dL (ref 13.0–17.0)
Immature Granulocytes: 0 %
Lymphocytes Relative: 28 %
Lymphs Abs: 1.5 10*3/uL (ref 0.7–4.0)
MCH: 29.9 pg (ref 26.0–34.0)
MCHC: 32.6 g/dL (ref 30.0–36.0)
MCV: 91.6 fL (ref 80.0–100.0)
Monocytes Absolute: 0.5 10*3/uL (ref 0.1–1.0)
Monocytes Relative: 9 %
Neutro Abs: 3.3 10*3/uL (ref 1.7–7.7)
Neutrophils Relative %: 59 %
Platelets: 407 10*3/uL — ABNORMAL HIGH (ref 150–400)
RBC: 4.78 MIL/uL (ref 4.22–5.81)
RDW: 13.3 % (ref 11.5–15.5)
WBC: 5.5 10*3/uL (ref 4.0–10.5)
nRBC: 0 % (ref 0.0–0.2)

## 2021-07-07 LAB — BASIC METABOLIC PANEL
Anion gap: 10 (ref 5–15)
BUN: 10 mg/dL (ref 6–20)
CO2: 29 mmol/L (ref 22–32)
Calcium: 9.1 mg/dL (ref 8.9–10.3)
Chloride: 100 mmol/L (ref 98–111)
Creatinine, Ser: 0.62 mg/dL (ref 0.61–1.24)
GFR, Estimated: >60 mL/min (ref >60)
Glucose, Bld: 93 mg/dL (ref 70–99)
Potassium: 4.3 mmol/L (ref 3.5–5.1)
Sodium: 139 mmol/L (ref 135–145)

## 2021-07-07 LAB — TROPONIN I (HIGH SENSITIVITY): Troponin I (High Sensitivity): <2 ng/L (ref <18)

## 2021-07-07 NOTE — ED Provider Notes (Signed)
Emergency Medicine Provider Triage Evaluation Note  Aaron [name] , a 45 y.o. male  was evaluated in triage.  Pt complains of right-sided chest pain.  Pain has been constant over the last 2 days.  Pain does not radiate.  Pain is described as sharp.  Pain is worse with bending over.  Endorses associated shortness of breath.  Patient denies any nausea, vomiting, diaphoresis, unilateral leg swelling or tenderness, history of DVT or PE, cancer history, hormone therapy, hemoptysis.  Review of Systems  Positive: Chest pain, shortness of breath Negative: nausea, vomiting, diaphoresis, unilateral leg swelling or tenderness, history of DVT or PE, cancer history, hormone therapy, hemoptysis.  Physical Exam  BP 99/81   Pulse 78   Temp 98.5 F (36.9 C) (Oral)   Resp 13   SpO2 99%  Gen:   Awake, no distress   Resp:  Normal effort, lungs clear to auscultation bilaterally MSK:   Moves extremities without difficulty, no swelling or tenderness to bilateral lower extremities. Other:  Abdomen soft, nondistended, nontender.  Medical Decision Making  Medically screening exam initiated at 4:15 PM.  Appropriate orders placed.  Aaron [name] was informed that the remainder of the evaluation will be completed by another provider, this initial triage assessment does not replace that evaluation, and the importance of remaining in the ED until their evaluation is complete.  The patient appears stable so that the remainder of the work up may be completed by another provider.      Aaron [name] 07/07/21 1617    Aaron Files, MD 07/07/21 Aaron [name]

## 2021-07-07 NOTE — ED Triage Notes (Signed)
Pt arrives POV with chest pain x2days. Pt states pain is on the right side and gets worse when bending over. Pt states the pain is sharp. Pt also reports having difficulty catching his breath.

## 2021-07-07 NOTE — ED Notes (Signed)
Pt has been called x3 at noted times. Pt has not been present to name call or visible throughout ED lobby. Triage RN notified.

## 2023-02-07 IMAGING — CT CT RENAL STONE PROTOCOL
2 of 4 series · 17 of 46 positions shown, 19 images · non-contrast
Comparison: None.

CLINICAL DATA: Right flank pain

EXAM:
CT ABDOMEN AND PELVIS WITHOUT CONTRAST
TECHNIQUE: Multidetector CT imaging of the abdomen and pelvis was performed
following the standard protocol without IV contrast.

[Series 2: axial st · axial · 0.78mm/px · z∈[-388,-3]mm · 14 of 87 slices shown, 16 images]
[im 5/87  soft-tissue]
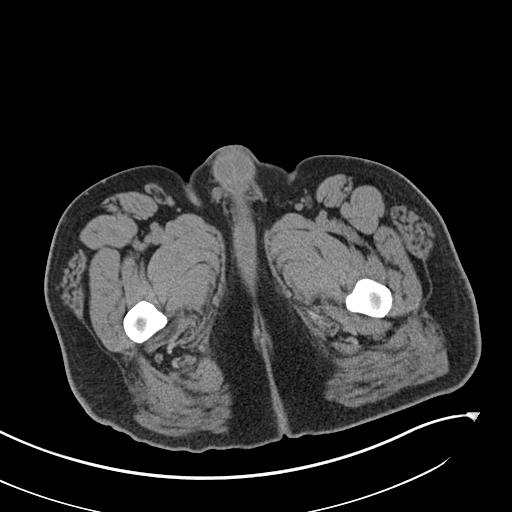
[im 5/87  bone]
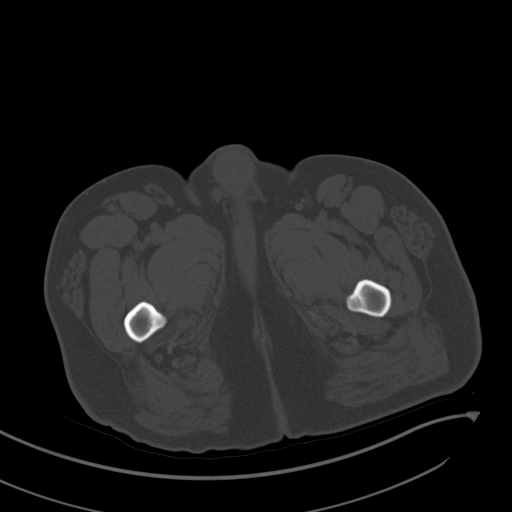
[im 10/87  soft-tissue]
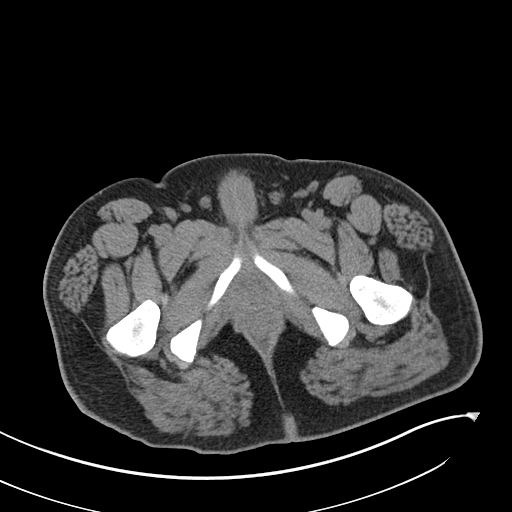
[im 20/87  soft-tissue]
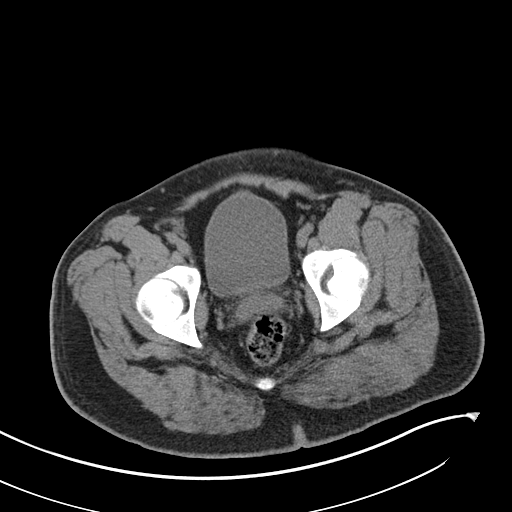
[im 24/87  soft-tissue]
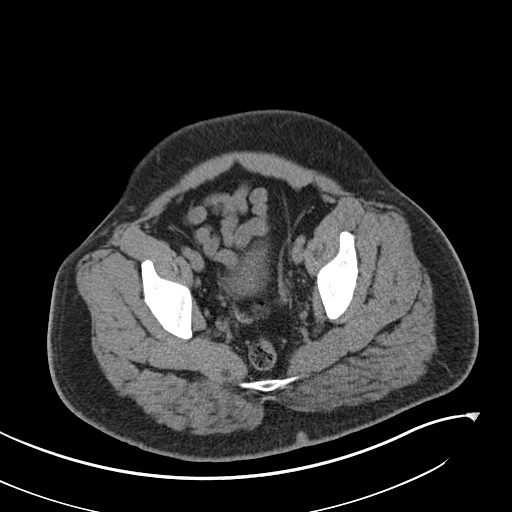
[im 29/87  soft-tissue]
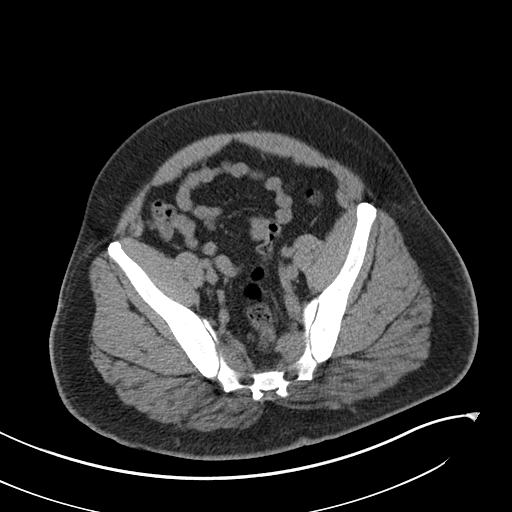
[im 34/87  soft-tissue]
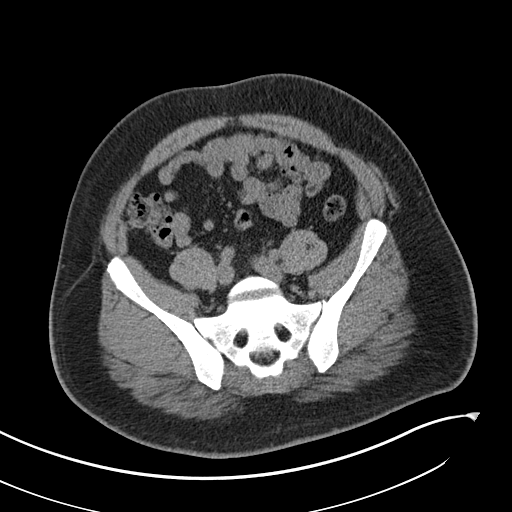
[im 39/87  soft-tissue]
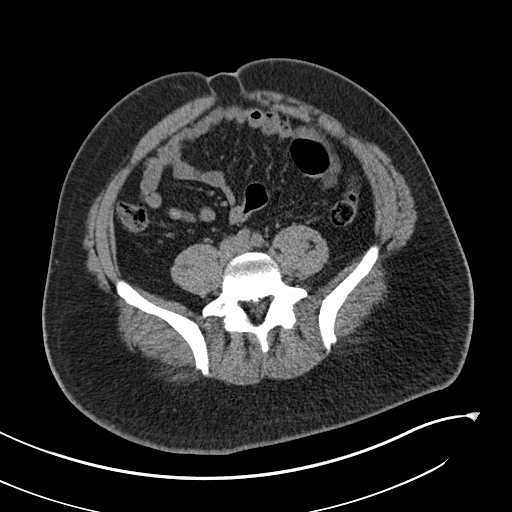
[im 48/87  soft-tissue]
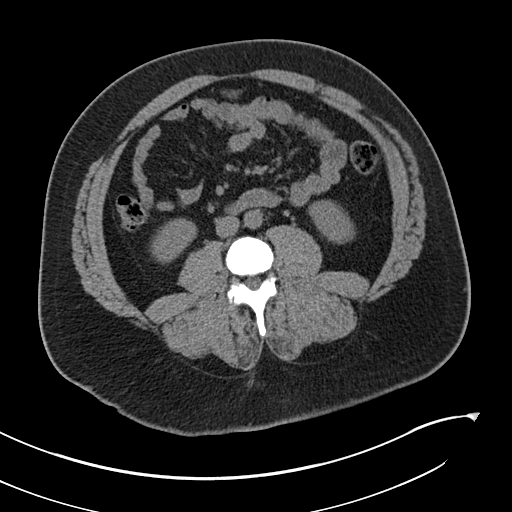
[im 53/87  soft-tissue]
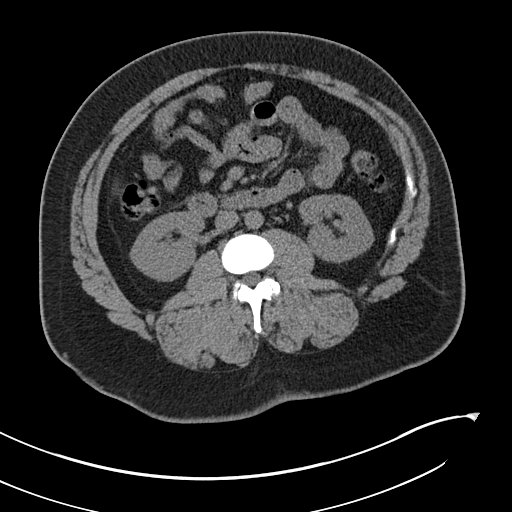
[im 53/87  bone]
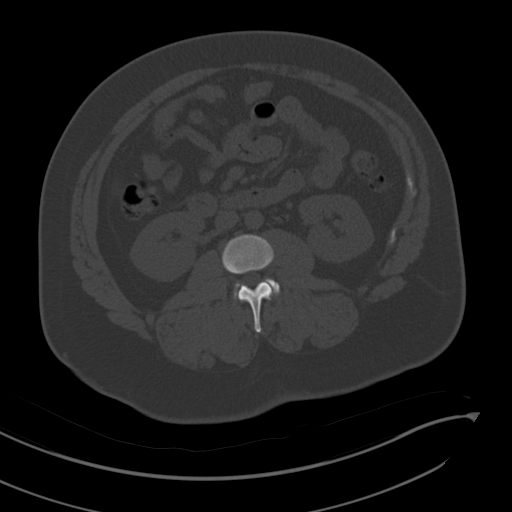
[im 58/87  soft-tissue]
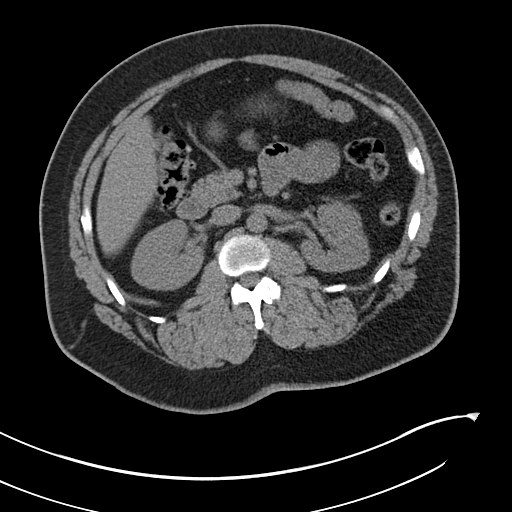
[im 63/87  soft-tissue]
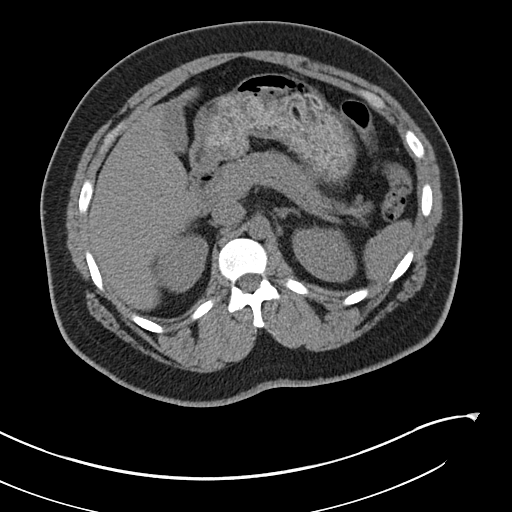
[im 67/87  soft-tissue]
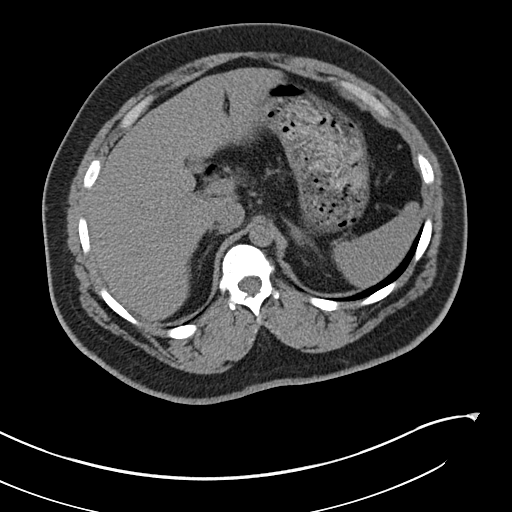
[im 77/87  soft-tissue]
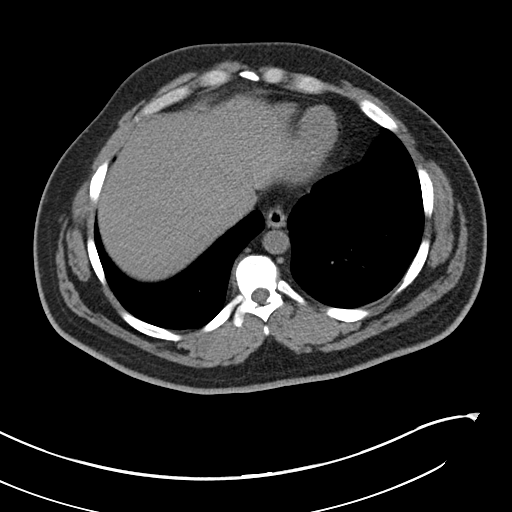
[im 82/87  soft-tissue]
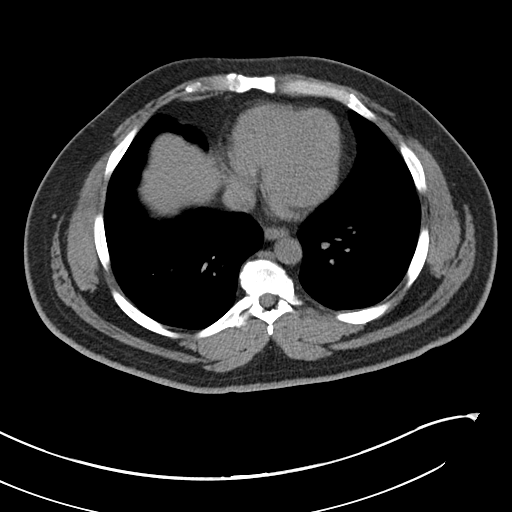

[Series 5: coronal · coronal · 0.85mm/px · 3 of 167 slices shown]
[im 56/167  soft-tissue]
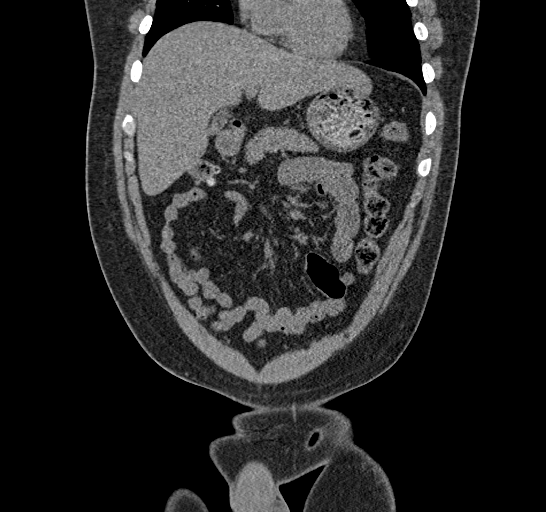
[im 74/167  soft-tissue]
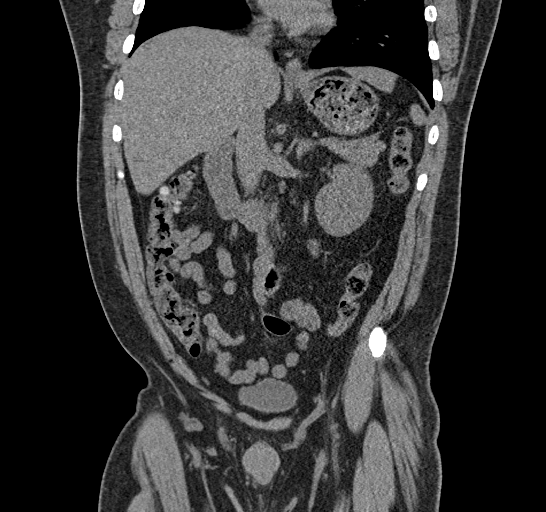
[im 93/167  soft-tissue]
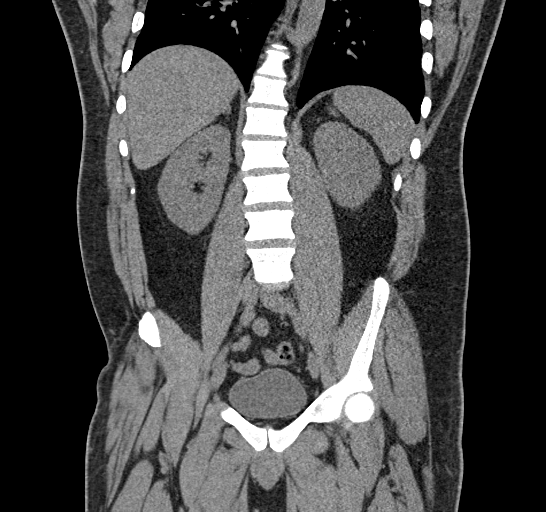

[17 of 46 positions shown; findings below may reference images not displayed]

FINDINGS: Lower chest: No acute abnormality.

Hepatobiliary: No focal liver abnormality is seen. No gallstones,
gallbladder wall thickening, or biliary dilatation.

Pancreas: Unremarkable.

Spleen: Unremarkable.

Adrenals/Urinary Tract: Adrenals are unremarkable. No renal calculi.
No hydronephrosis. Ureters are normal in caliber. Bladder is
unremarkable.

Stomach/Bowel: Stomach is within normal limits. Bowel is normal in
caliber. Few small right-sided colonic diverticula.

Vascular/Lymphatic: No significant vascular findings.

Reproductive: Prostate is unremarkable.

Other: No free fluid.  Abdominal wall is unremarkable.

Musculoskeletal: No acute osseous abnormality. Mild lumbar spine
degenerative changes.
IMPRESSION: No acute abnormality.  Specifically, no urinary tract calculi.

## 2023-10-07 ENCOUNTER — Other Ambulatory Visit: Payer: Self-pay

## 2023-10-07 ENCOUNTER — Other Ambulatory Visit (INDEPENDENT_AMBULATORY_CARE_PROVIDER_SITE_OTHER)
Admission: EM | Admit: 2023-10-07 | Discharge: 2023-10-13 | Disposition: A | Discharge status: Other Acute Inpatient Hospital | Payer: 59 | Source: Home / Self Care | Admitting: Nurse Practitioner

## 2023-10-07 ENCOUNTER — Encounter (HOSPITAL_COMMUNITY): Payer: Self-pay

## 2023-10-07 ENCOUNTER — Emergency Department (HOSPITAL_COMMUNITY)
Admission: EM | Admit: 2023-10-07 | Discharge: 2023-10-07 | Disposition: A | Discharge status: Other Acute Inpatient Hospital | Payer: 59 | Attending: Emergency Medicine | Admitting: Emergency Medicine

## 2023-10-07 DIAGNOSIS — F17200 Nicotine dependence, unspecified, uncomplicated: Secondary | ICD-10-CM

## 2023-10-07 DIAGNOSIS — F430 Acute stress reaction: Secondary | ICD-10-CM | POA: Insufficient documentation

## 2023-10-07 DIAGNOSIS — Z5901 Sheltered homelessness: Secondary | ICD-10-CM | POA: Insufficient documentation

## 2023-10-07 DIAGNOSIS — F1024 Alcohol dependence with alcohol-induced mood disorder: Secondary | ICD-10-CM | POA: Insufficient documentation

## 2023-10-07 DIAGNOSIS — E119 Type 2 diabetes mellitus without complications: Secondary | ICD-10-CM | POA: Insufficient documentation

## 2023-10-07 DIAGNOSIS — Y903 Blood alcohol level of 60-79 mg/100 ml: Secondary | ICD-10-CM | POA: Diagnosis not present

## 2023-10-07 DIAGNOSIS — E781 Pure hyperglyceridemia: Secondary | ICD-10-CM | POA: Insufficient documentation

## 2023-10-07 DIAGNOSIS — F17210 Nicotine dependence, cigarettes, uncomplicated: Secondary | ICD-10-CM | POA: Insufficient documentation

## 2023-10-07 DIAGNOSIS — F1994 Other psychoactive substance use, unspecified with psychoactive substance-induced mood disorder: Secondary | ICD-10-CM

## 2023-10-07 DIAGNOSIS — F439 Reaction to severe stress, unspecified: Secondary | ICD-10-CM

## 2023-10-07 DIAGNOSIS — R9431 Abnormal electrocardiogram [ECG] [EKG]: Secondary | ICD-10-CM | POA: Insufficient documentation

## 2023-10-07 DIAGNOSIS — F419 Anxiety disorder, unspecified: Secondary | ICD-10-CM | POA: Insufficient documentation

## 2023-10-07 DIAGNOSIS — Z7984 Long term (current) use of oral hypoglycemic drugs: Secondary | ICD-10-CM | POA: Insufficient documentation

## 2023-10-07 DIAGNOSIS — Z5941 Food insecurity: Secondary | ICD-10-CM | POA: Diagnosis not present

## 2023-10-07 DIAGNOSIS — F1020 Alcohol dependence, uncomplicated: Secondary | ICD-10-CM | POA: Diagnosis present

## 2023-10-07 DIAGNOSIS — R45851 Suicidal ideations: Secondary | ICD-10-CM | POA: Insufficient documentation

## 2023-10-07 DIAGNOSIS — I10 Essential (primary) hypertension: Secondary | ICD-10-CM | POA: Insufficient documentation

## 2023-10-07 DIAGNOSIS — F418 Other specified anxiety disorders: Secondary | ICD-10-CM | POA: Insufficient documentation

## 2023-10-07 DIAGNOSIS — F10229 Alcohol dependence with intoxication, unspecified: Secondary | ICD-10-CM | POA: Insufficient documentation

## 2023-10-07 DIAGNOSIS — Z560 Unemployment, unspecified: Secondary | ICD-10-CM | POA: Insufficient documentation

## 2023-10-07 DIAGNOSIS — F1090 Alcohol use, unspecified, uncomplicated: Secondary | ICD-10-CM

## 2023-10-07 DIAGNOSIS — Z79899 Other long term (current) drug therapy: Secondary | ICD-10-CM | POA: Insufficient documentation

## 2023-10-07 DIAGNOSIS — F1014 Alcohol abuse with alcohol-induced mood disorder: Secondary | ICD-10-CM | POA: Insufficient documentation

## 2023-10-07 HISTORY — DX: Type 2 diabetes mellitus without complications: E11.9

## 2023-10-07 HISTORY — DX: Essential (primary) hypertension: I10

## 2023-10-07 LAB — CBC WITH DIFFERENTIAL/PLATELET
Abs Immature Granulocytes: 0.03 10*3/uL (ref 0.00–0.07)
Basophils Absolute: 0 10*3/uL (ref 0.0–0.1)
Basophils Relative: 0 %
Eosinophils Absolute: 0.2 10*3/uL (ref 0.0–0.5)
Eosinophils Relative: 2 %
HCT: 43.1 % (ref 39.0–52.0)
Hemoglobin: 14.4 g/dL (ref 13.0–17.0)
Immature Granulocytes: 0 %
Lymphocytes Relative: 27 %
Lymphs Abs: 2.7 10*3/uL (ref 0.7–4.0)
MCH: 29.4 pg (ref 26.0–34.0)
MCHC: 33.4 g/dL (ref 30.0–36.0)
MCV: 88 fL (ref 80.0–100.0)
Monocytes Absolute: 0.6 10*3/uL (ref 0.1–1.0)
Monocytes Relative: 6 %
Neutro Abs: 6.3 10*3/uL (ref 1.7–7.7)
Neutrophils Relative %: 65 %
Platelets: 414 10*3/uL — ABNORMAL HIGH (ref 150–400)
RBC: 4.9 MIL/uL (ref 4.22–5.81)
RDW: 15.1 % (ref 11.5–15.5)
WBC: 9.8 10*3/uL (ref 4.0–10.5)
nRBC: 0 % (ref 0.0–0.2)

## 2023-10-07 LAB — COMPREHENSIVE METABOLIC PANEL
ALT: 35 U/L (ref 0–44)
AST: 29 U/L (ref 15–41)
Albumin: 4 g/dL (ref 3.5–5.0)
Alkaline Phosphatase: 89 U/L (ref 38–126)
Anion gap: 16 — ABNORMAL HIGH (ref 5–15)
BUN: 16 mg/dL (ref 6–20)
CO2: 23 mmol/L (ref 22–32)
Calcium: 8.9 mg/dL (ref 8.9–10.3)
Chloride: 97 mmol/L — ABNORMAL LOW (ref 98–111)
Creatinine, Ser: 0.77 mg/dL (ref 0.61–1.24)
GFR, Estimated: >60 mL/min (ref >60)
Glucose, Bld: 141 mg/dL — ABNORMAL HIGH (ref 70–99)
Potassium: 3.2 mmol/L — ABNORMAL LOW (ref 3.5–5.1)
Sodium: 136 mmol/L (ref 135–145)
Total Bilirubin: 0.5 mg/dL (ref <1.2)
Total Protein: 8.5 g/dL — ABNORMAL HIGH (ref 6.5–8.1)

## 2023-10-07 LAB — ETHANOL: Alcohol, Ethyl (B): 60 mg/dL — ABNORMAL HIGH (ref <10)

## 2023-10-07 LAB — RAPID URINE DRUG SCREEN, HOSP PERFORMED
Amphetamines: NOT DETECTED
Barbiturates: NOT DETECTED
Benzodiazepines: POSITIVE — AB
Cocaine: NOT DETECTED
Opiates: NOT DETECTED
Tetrahydrocannabinol: NOT DETECTED

## 2023-10-07 LAB — SALICYLATE LEVEL: Salicylate Lvl: <7 mg/dL — ABNORMAL LOW (ref 7.0–30.0)

## 2023-10-07 LAB — ACETAMINOPHEN LEVEL: Acetaminophen (Tylenol), Serum: <10 ug/mL — ABNORMAL LOW (ref 10–30)

## 2023-10-07 MED ORDER — THIAMINE MONONITRATE 100 MG PO TABS
100.0000 mg | ORAL_TABLET | Freq: Every day | ORAL | Status: DC
Start: 2023-10-07 — End: 2023-10-07
  Administered 2023-10-07: 100 mg via ORAL
  Filled 2023-10-07: qty 1

## 2023-10-07 MED ORDER — ADULT MULTIVITAMIN W/MINERALS CH
1.0000 | ORAL_TABLET | Freq: Every day | ORAL | Status: DC
Start: 2023-10-07 — End: 2023-10-07
  Administered 2023-10-07: 1 via ORAL
  Filled 2023-10-07: qty 1

## 2023-10-07 MED ORDER — ALUM & MAG HYDROXIDE-SIMETH 200-200-20 MG/5ML PO SUSP
30.0000 mL | ORAL | Status: DC | PRN
  Administered 2023-10-11: 30 mL via ORAL
  Filled 2023-10-07: qty 30

## 2023-10-07 MED ORDER — ACETAMINOPHEN 325 MG PO TABS: 650.0000 mg | ORAL_TABLET | Freq: Four times a day (QID) | ORAL | Status: DC | PRN

## 2023-10-07 MED ORDER — POTASSIUM CHLORIDE CRYS ER 20 MEQ PO TBCR
40.0000 meq | EXTENDED_RELEASE_TABLET | Freq: Once | ORAL | Status: AC
  Administered 2023-10-07: 40 meq via ORAL
  Filled 2023-10-07: qty 2

## 2023-10-07 MED ORDER — FOLIC ACID 1 MG PO TABS
1.0000 mg | ORAL_TABLET | Freq: Every day | ORAL | Status: DC
  Administered 2023-10-07: 1 mg via ORAL
  Filled 2023-10-07: qty 1

## 2023-10-07 MED ORDER — LORAZEPAM 0.5 MG PO TABS: 0.5000 mg | ORAL_TABLET | ORAL | Status: DC | PRN

## 2023-10-07 MED ORDER — THIAMINE HCL 100 MG/ML IJ SOLN: 100.0000 mg | Freq: Every day | INTRAMUSCULAR | Status: DC

## 2023-10-07 MED ORDER — HYDROXYZINE HCL 25 MG PO TABS: 25.0000 mg | ORAL_TABLET | Freq: Three times a day (TID) | ORAL | Status: DC | PRN

## 2023-10-07 MED ORDER — MAGNESIUM HYDROXIDE 400 MG/5ML PO SUSP: 30.0000 mL | Freq: Every day | ORAL | Status: DC | PRN

## 2023-10-07 MED ORDER — TRAZODONE HCL 50 MG PO TABS
50.0000 mg | ORAL_TABLET | Freq: Every evening | ORAL | Status: DC | PRN
  Administered 2023-10-08 – 2023-10-12 (×4): 50 mg via ORAL
  Filled 2023-10-07 (×5): qty 1

## 2023-10-07 MED ORDER — LORAZEPAM 1 MG PO TABS: 1.0000 mg | ORAL_TABLET | ORAL | Status: DC | PRN

## 2023-10-07 NOTE — Discharge Instructions (Addendum)
It was our pleasure to provide your ER care today - we hope that you feel better.  Follow up closely with behavioral health provider in the next 1-2 days - you may go directly to the [facility], see attached information - they are open 24/7 and walk-ins are welcome.   Additional behavioral health resources are also attached.   Avoid alcohol use as it is harmful to your physical health and mental well-being. See resource guide attached in terms of accessing inpatient or outpatient alcohol use treatment programs as well as other behavioral health resources in the area.   Return to ER if worse, new symptoms, fevers, chest pain, trouble breathing, or other emergency concern.

## 2023-10-07 NOTE — Progress Notes (Signed)
   10/07/23 2029  [facility] Triage Screening (Walk-ins at [facility] only)  What Is the Reason for Your Visit/Call Today? Patient is a direct admit to [facility].  How Long Has This Been Causing You Problems? <Week  Have You Recently Had Any Thoughts About Hurting Yourself? Yes  Are You Planning to Commit Suicide/Harm Yourself At This time? Yes  Have you Recently Had Thoughts About Hurting Someone Karolee Ohs? No  Are You Planning To Harm Someone At This Time? No  Are you currently experiencing any auditory, visual or other hallucinations? No  Have You Used Any Alcohol or Drugs in the Past 24 Hours? Yes  How long ago did you use Drugs or Alcohol? alcohol  Do you have any current medical co-morbidities that require immediate attention? No  Clinician description of patient physical appearance/behavior: Patient is fairly groomed.  What Do You Feel Would Help You the Most Today? Treatment for Depression or other mood problem;Alcohol or Drug Use Treatment  Determination of Need Urgent (48 hours)  Options For Referral Facility-Based Crisis

## 2023-10-07 NOTE — ED Triage Notes (Signed)
Patient is a direct admit to [facility].

## 2023-10-07 NOTE — ED Notes (Incomplete)
Patient admitted to [facility] from [facility] after he was sent her via safe transport from the ED.  Patient had gone to the ED as he is experiencing increased etoh use and depression.  Patient moved here from [place] and is unemployed and has been drinking more frequently.  Patient reported earlier that he was hearing voices telling him to kill himself.  Tonight he reports that he feels bad due to his current situation and that he will seek out staff if he feels overwhelmed

## 2023-10-07 NOTE — ED Triage Notes (Signed)
Patient here for evaluation of suicidal ideations. Reports that he has "not been feeling right." Pt reports history of depression. States he just moved here from [place] and "something is not sitting right." Pt reports thinking about overdosing on pills or walking into traffic. Pt also reports he has been binge drinking for the past few days. States his last drink was this afternoon.

## 2023-10-07 NOTE — ED Notes (Signed)
Safe transport set up for pt.  

## 2023-10-07 NOTE — ED Notes (Signed)
Patient admitted to [facility] from [facility] after he was sent her via safe transport from the ED.  Patient had gone to the ED as he is experiencing increased etoh use and depression.  Patient moved here from [place] and is unemployed and has been drinking more frequently.  Patient reported earlier that he was hearing voices telling him to kill himself.  Tonight he reports that he feels bad due to his current situation and that he will seek out staff if he feels overwhelmed.  Patient was oriented to unit and given something to drink and a set of clothe scrubs. He is now resting in bed without distress or complaint. No evidence of etoh withdrawal at this time.  CIWA 0.  Will monitor and provide a safe environment.

## 2023-10-07 NOTE — ED Notes (Signed)
Patient given ginger ale. Patient refused his dinner tray.

## 2023-10-07 NOTE — ED Provider Notes (Addendum)
[place] EMERGENCY DEPARTMENT AT [facility] Provider Note   CSN: [id number] Arrival date & time: 10/07/23  1536     History  Chief Complaint  Patient presents with   stressed   Anxiety    Panfilo Sibaja III is a 47 y.o. male.  Pt c/o feeling stressed and anxious at times. Indicates he just moved here from [place]. Pt unclear as to why he moved here - denies family or close friends in area, indicates did not move b/c of work/job, indicates staying with someone he knows. States formerly was in [employer] for awhile staying at a [employer] [facility] [place]. Indicates occasional binge drinking of etoh. Denies hx complicated etoh withdrawal,  seizures or dts. Denies acute physical health issues. No specific pain. No fevers. Normal appetite. No wt loss.   The history is provided by the patient and medical records.  Anxiety Pertinent negatives include no chest pain, no abdominal pain, no headaches and no shortness of breath.       Home Medications Prior to Admission medications   Not on File      Allergies    Patient has no known allergies.    Review of Systems   Review of Systems  Constitutional:  Negative for fever.  Respiratory:  Negative for cough and shortness of breath.   Cardiovascular:  Negative for chest pain.  Gastrointestinal:  Negative for abdominal pain and vomiting.  Neurological:  Negative for headaches.  Psychiatric/Behavioral:  Positive for dysphoric mood. The patient is nervous/anxious.     Physical Exam Updated Vital Signs BP (!) 125/98   Pulse (!) 108   Temp 98.1 F (36.7 C) (Oral)   Resp 16   Ht 1.549 m (5\' 1" )   Wt 75.8 kg   SpO2 100%   BMI 31.57 kg/m  Physical Exam Vitals and nursing note reviewed.  Constitutional:      Appearance: Normal appearance. He is well-developed.  HENT:     Head: Atraumatic.     Nose: Nose normal.     Mouth/Throat:     Mouth: Mucous membranes are moist.     Pharynx: Oropharynx is clear.  Eyes:      General: No scleral icterus.    Conjunctiva/sclera: Conjunctivae normal.     Pupils: Pupils are equal, round, and reactive to light.  Neck:     Trachea: No tracheal deviation.  Cardiovascular:     Rate and Rhythm: Normal rate and regular rhythm.     Pulses: Normal pulses.     Heart sounds: Normal heart sounds. No murmur heard.    No friction rub. No gallop.  Pulmonary:     Effort: Pulmonary effort is normal. No accessory muscle usage or respiratory distress.     Breath sounds: Normal breath sounds.  Abdominal:     General: There is no distension.     Palpations: Abdomen is soft.     Tenderness: There is no abdominal tenderness.  Musculoskeletal:        General: No swelling or tenderness.     Cervical back: Normal range of motion and neck supple. No rigidity.  Skin:    General: Skin is warm and dry.     Findings: No rash.  Neurological:     Mental Status: He is alert.     Comments: Alert, speech clear. Steady gait.   Psychiatric:     Comments: Pt indicates has been feeling stress and anxious lately - indicates at times he gets that when  when around others he does not know well. Overall exhibits grossly normal mood and affect. Had fleeting thoughts of self harm, but currently denies any desire or plan to hurt self or others.  Pt does not appear to be responding to internal stimuli - no acute psychosis noted.      ED Results / Procedures / Treatments   Labs (all labs ordered are listed, but only abnormal results are displayed) Results for orders placed or performed during the hospital encounter of 10/07/23  Comprehensive metabolic panel  Result Value Ref Range   Sodium 136 135 - 145 mmol/L   Potassium 3.2 (L) 3.5 - 5.1 mmol/L   Chloride 97 (L) 98 - 111 mmol/L   CO2 23 22 - 32 mmol/L   Glucose, Bld 141 (H) 70 - 99 mg/dL   BUN 16 6 - 20 mg/dL   Creatinine, Ser 8.29 0.61 - 1.24 mg/dL   Calcium 8.9 8.9 - 56.2 mg/dL   Total Protein 8.5 (H) 6.5 - 8.1 g/dL   Albumin 4.0 3.5 - 5.0  g/dL   AST 29 15 - 41 U/L   ALT 35 0 - 44 U/L   Alkaline Phosphatase 89 38 - 126 U/L   Total Bilirubin 0.5 <1.2 mg/dL   GFR, Estimated >13 >08 mL/min   Anion gap 16 (H) 5 - 15  Ethanol  Result Value Ref Range   Alcohol, Ethyl (B) 60 (H) <10 mg/dL  Urine rapid drug screen (hosp performed)  Result Value Ref Range   Opiates NONE DETECTED NONE DETECTED   Cocaine NONE DETECTED NONE DETECTED   Benzodiazepines POSITIVE (A) NONE DETECTED   Amphetamines NONE DETECTED NONE DETECTED   Tetrahydrocannabinol NONE DETECTED NONE DETECTED   Barbiturates NONE DETECTED NONE DETECTED  CBC with Diff  Result Value Ref Range   WBC 9.8 4.0 - 10.5 K/uL   RBC 4.90 4.22 - 5.81 MIL/uL   Hemoglobin 14.4 13.0 - 17.0 g/dL   HCT 65.7 84.6 - 96.2 %   MCV 88.0 80.0 - 100.0 fL   MCH 29.4 26.0 - 34.0 pg   MCHC 33.4 30.0 - 36.0 g/dL   RDW 95.2 84.1 - 32.4 %   Platelets 414 (H) 150 - 400 K/uL   nRBC 0.0 0.0 - 0.2 %   Neutrophils Relative % 65 %   Neutro Abs 6.3 1.7 - 7.7 K/uL   Lymphocytes Relative 27 %   Lymphs Abs 2.7 0.7 - 4.0 K/uL   Monocytes Relative 6 %   Monocytes Absolute 0.6 0.1 - 1.0 K/uL   Eosinophils Relative 2 %   Eosinophils Absolute 0.2 0.0 - 0.5 K/uL   Basophils Relative 0 %   Basophils Absolute 0.0 0.0 - 0.1 K/uL   Immature Granulocytes 0 %   Abs Immature Granulocytes 0.03 0.00 - 0.07 K/uL  Acetaminophen level  Result Value Ref Range   Acetaminophen (Tylenol), Serum <10 (L) 10 - 30 ug/mL  Salicylate level  Result Value Ref Range   Salicylate Lvl <7.0 (L) 7.0 - 30.0 mg/dL     EKG None  Radiology No results found.  Procedures Procedures    Medications Ordered in ED Medications  LORazepam (ATIVAN) tablet 1-4 mg (has no administration in time range)    Or  LORazepam (ATIVAN) tablet 0.5 mg (has no administration in time range)  thiamine (VITAMIN B1) tablet 100 mg (100 mg Oral Given 10/07/23 1642)    Or  thiamine (VITAMIN B1) injection 100 mg ( Intravenous See Alternative  10/07/23  1642)  folic acid (FOLVITE) tablet 1 mg (1 mg Oral Given 10/07/23 1643)  multivitamin with minerals tablet 1 tablet (1 tablet Oral Given 10/07/23 1643)  potassium chloride SA (KLOR-CON M) CR tablet 40 mEq (40 mEq Oral Given 10/07/23 1821)    ED Course/ Medical Decision Making/ A&P                                 Medical Decision Making Problems Addressed: Alcohol abuse with alcohol-induced mood disorder (HCC): acute illness or injury    Details: Acute/chronic/recurrent Anxiety: acute illness or injury    Details: Acute/chronic Stress: acute illness or injury    Details: Acute/chronic Stress reaction, acute, predominant emotional: acute illness or injury    Details: Acute/chronic/recurrent  Amount and/or Complexity of Data Reviewed External Data Reviewed: notes. Labs: ordered. Decision-making details documented in ED Course.  Risk OTC drugs. Prescription drug management. Decision regarding hospitalization.    Labs ordered/sent.  Differential diagnosis includes acute stress reaction, anxiety, depression, etc. Dispo decision including potential need for admission considered - will get labs and reassess.   Reviewed nursing notes and prior charts for additional history. External reports reviewed.   Po fluids/food.   Labs reviewed/interpreted by me - wbc and hgb normal. Chem normal x k sl low. Kcl po. Etoh mildly high. Recheck pt, no tremor or shakes. Currently hr 88, rr 16.   Pt currently appears stable for ED d/c.   In terms of BH symptoms, anxiety/stress/depression - currently no acute psychosis, no desire or plan to harm self or others. Rec close BH follow up - will give [employer] including as same day and/or next day resource. Additional community resources also provided both in terms of etoh use disorder and BH resources, as well as pcp f/u.  Return precautions provided.           Final Clinical Impression(s) / ED Diagnoses Final diagnoses:   None    Rx / DC Orders ED Discharge Orders     None         Cathren Laine, MD 10/07/23 1846

## 2023-10-07 NOTE — BH Assessment (Signed)
Comprehensive Clinical Assessment (CCA) Note  10/07/2023 Aaron [name] [id number]  Disposition: MSE completed by Roselyn Bering, NP, who recommends Facility Based Crisis.   The patient demonstrates the following risk factors for suicide: Chronic risk factors for suicide include: substance use disorder. Acute risk factors for suicide include: unemployment and social withdrawal/isolation. Protective factors for this patient include: hope for the future. Considering these factors, the overall suicide risk at this point appears to be high. Patient is not appropriate for outpatient follow up.  Aaron [name] is a 43-+year-old male who presents to [facility] from [facility], via safe transport. Pt reports he has been experiencing increasing anxiety and depression the past week. Pt reports he arrived to the ED today due to SI and AH telling him to take his life. Pt acknowledges symptoms of hopelessness, irritability, and lack of sleep. Pt denies HI or VH. Pt denies any previous suicide attempts. Pt states he drank liquor today from 7a to 2p. He denies additional substance use. Pt reports he arrived to [place] from [place], this past Sunday. He says he has family in [place], [place] and planned to move back to [place]. Pt reports he has been living in a hotel for the past 5 days.   Pt states he is unable to identify any stressors that may be contributing to his depression. Pt states he is unemployed and has no form of income. Pt identifies his mother and friends as his primary supports. Pt denies any history of abuse or trauma. Pt denies any legal problems.   Pt is not currently receiving any mental health services. He reports he was hospitalized in [place], one month ago, for 5 days to to [facility]. Pt states he was prescribed an antidepressant upon discharge, however, he has not taken it. Pt reports being diagnosed with diabetes and high cholesterol.   Pt is dressed in scrubs, alert, and oriented with normal  speech. Pt makes eye contact and there is no indication he is responding to internal stimuli. Pt's thought process is coherent and relevant. Pt was cooperative throughout the assessment. Patient reports he is interested getting his "mind together" and off alcohol.    Chief Complaint:  Chief Complaint  Patient presents with   Anxiety   Stress   Addiction Problem   Visit Diagnosis: Suicidal Alcohol dependence    CCA Screening, Triage and Referral (STR)  Patient Reported Information How did you hear about [place]? Hospital Discharge  What Is the Reason for Your Visit/Call Today? Aaron [name] is a 47 year old male who present to [facility] from [facility] via safe transport. . Pt reports passive suicidal ideations. He states he has been experiencing depression and worsening anxiety the past week. Pt states he started drinking alcohol at 7a and did not stop until 2p. Pt reports he was 8 months sober, until 1 month ago. Pt states he has previous hospitalizations for SI. He was prescribed an antidepressant a month ago that he has not taken. In addition to SI, Pt reports AH telling him to take his life. Pt denies HI or VH.  How Long Has This Been Causing You Problems? 1 wk - 1 month  What Do You Feel Would Help You the Most Today? Alcohol or Drug Use Treatment; Treatment for Depression or other mood problem   Have You Recently Had Any Thoughts About Hurting Yourself? Yes  Are You Planning to Commit Suicide/Harm Yourself At This time? No   Flowsheet Row ED from  10/07/2023 in [facility] Most recent reading at 10/07/2023  8:29 PM ED from 10/07/2023 in [facility] Emergency Department at [facility] Most recent reading at 10/07/2023  3:58 PM ED from 07/07/2021 in [facility] Emergency Department at [facility] Most recent reading at 07/07/2021  4:06 PM  C-SSRS RISK CATEGORY High Risk High Risk No Risk       Have you Recently Had Thoughts  About Hurting Someone Karolee Ohs? No  Are You Planning to Harm Someone at This Time? No  Explanation: N/A   Have You Used Any Alcohol or Drugs in the Past 24 Hours? Yes  What Did You Use and How Much? An unknown amount of alcohol.   Do You Currently Have a Therapist/Psychiatrist? No  Name of Therapist/Psychiatrist: Name of Therapist/Psychiatrist: N/A   Have You Been Recently Discharged From Any Office Practice or Programs? Yes  Explanation of Discharge From Practice/Program: D/C from [facility] today.     CCA Screening Triage Referral Assessment Type of Contact: Face-to-Face  Telemedicine Service Delivery:   Is this Initial or Reassessment?   Date Telepsych consult ordered in [facility]:    Time Telepsych consult ordered in [facility]:    Location of Assessment: [facility] Assessment Services  Provider Location: [facility] Assessment Services   Collateral Involvement: None.   Does Patient Have a Automotive engineer Guardian? No  Legal Guardian Contact Information: N/A  Copy of Legal Guardianship Form: -- (N/A)  Legal Guardian Notified of Arrival: -- (N/A)  Legal Guardian Notified of Pending Discharge: -- (N/A)  If Minor and Not Living with Parent(s), Who has Custody? N/A  Is CPS involved or ever been involved? Never  Is APS involved or ever been involved? Never   Patient Determined To Be At Risk for Harm To Self or Others Based on Review of Patient Reported Information or Presenting Complaint? Yes, for Self-Harm (Denies HI.)  Method: No Plan (Denies HI.)  Availability of Means: No access or NA (Denies HI.)  Intent: Vague intent or NA (Denies HI.)  Notification Required: No need or identified person (Denies HI.)  Additional Information for Danger to Others Potential: -- (N/A)  Additional Comments for Danger to Others Potential: N/A  Are There Guns or Other Weapons in Your Home? No  Types of Guns/Weapons: N/A  Are These Weapons Safely Secured?                             -- (N/A)  Who Could Verify You Are Able To Have These Secured: N/A  Do You Have any Outstanding Charges, Pending Court Dates, Parole/Probation? Pt denies.  Contacted To Inform of Risk of Harm To Self or Others: Other: Comment (N/A)    Does Patient Present under Involuntary Commitment? No    [place] of Residence: Other (Comment) [place], [place].)   Patient Currently Receiving the Following Services: Not Receiving Services   Determination of Need: Urgent (48 hours)   Options For Referral: Facility-Based Crisis; Outpatient Therapy; Medication Management     CCA Biopsychosocial Patient Reported Schizophrenia/Schizoaffective Diagnosis in Past: No   Strengths: Pt has a supportive family.   Mental Health Symptoms Depression:   Sleep (too much or little); Irritability; Hopelessness   Duration of Depressive symptoms:  Duration of Depressive Symptoms: Less than two weeks   Mania:   None   Anxiety:    Restlessness   Psychosis:   Hallucinations   Duration of  Psychotic symptoms:  Duration of Psychotic Symptoms: Less than six months   Trauma:   None   Obsessions:   None   Compulsions:   None   Inattention:   None   Hyperactivity/Impulsivity:   None   Oppositional/Defiant Behaviors:   None   Emotional Irregularity:   None   Other Mood/Personality Symptoms:   N/A    Mental Status Exam Appearance and self-care  Stature:   Small   Weight:   Overweight   Clothing:   -- (Scrubs.)   Grooming:   Normal   Cosmetic use:   None   Posture/gait:   Normal   Motor activity:   Not Remarkable   Sensorium  Attention:   Normal   Concentration:   Normal   Orientation:   X5   Recall/memory:   Normal   Affect and Mood  Affect:   Depressed   Mood:   Depressed   Relating  Eye contact:   Normal   Facial expression:   Responsive   Attitude toward examiner:   Cooperative   Thought and Language  Speech flow:  Normal    Thought content:   Appropriate to Mood and Circumstances   Preoccupation:   None   Hallucinations:   Auditory   Organization:   Patent examiner of Knowledge:   Average   Intelligence:   Average   Abstraction:   Normal   Judgement:   Fair   Dance movement psychotherapist:   Adequate   Insight:   Lacking   Decision Making:   Normal   Social Functioning  Social Maturity:   Isolates; Irresponsible   Social Judgement:   "Street Smart"   Stress  Stressors:   Other (Comment) (Pt states he does not know.)   Coping Ability:   Overwhelmed   Skill Deficits:   Decision making   Supports:   Family; Friends/Service system     Religion: Religion/Spirituality Are You A Religious Person?: Yes What is Your Religious Affiliation?: Christian How Might This Affect Treatment?: N/A  Leisure/Recreation: Leisure / Recreation Do You Have Hobbies?: Yes Leisure and Hobbies: Basketball and fishing.  Exercise/Diet: Exercise/Diet Do You Exercise?: No Have You Gained or Lost A Significant Amount of Weight in the Past Six Months?: No Do You Follow a Special Diet?: No Do You Have Any Trouble Sleeping?: Yes Explanation of Sleeping Difficulties: Pt reports sleeping about 3 hours per night.   CCA Employment/Education Employment/Work Situation: Employment / Work Situation Employment Situation: Unemployed Patient's Job has Been Impacted by Current Illness: No Has Patient ever Been in Equities trader?: No  Education: Education Is Patient Currently Attending School?: No Last Grade Completed: 12 Did You Product manager?: No Did You Have An Individualized Education Program (IIEP): No Did You Have Any Difficulty At [employer]?: No Patient's Education Has Been Impacted by Current Illness: No   CCA Family/Childhood History Family and Relationship History: Family history Marital status: Single Does patient have children?: Yes How many children?: 1 How is patient's  relationship with their children?: 16yo, unknown.  Childhood History:  Childhood History By whom was/is the patient raised?: Both parents Did patient suffer any verbal/emotional/physical/sexual abuse as a child?: No Did patient suffer from severe childhood neglect?: No Has patient ever been sexually abused/assaulted/raped as an adolescent or adult?: No Was the patient ever a victim of a crime or a disaster?: No Witnessed domestic violence?: No Has patient been affected by domestic violence as an adult?: No  CCA Substance Use Alcohol/Drug Use: Alcohol / Drug Use Pain Medications: Unnamed antidepressant. Prescriptions: Diabetes and High choloesteral medication. Over the Counter: None. History of alcohol / drug use?: Yes Longest period of sobriety (when/how long): 5 months. Negative Consequences of Use:  (None identified.) Withdrawal Symptoms: None Substance #1 Name of Substance 1: Alcohol. 1 - Age of First Use: 22. 1 - Amount (size/oz): Unknown amount. 1 - Frequency: Daily. 1 - Duration: 1 month. 1 - Last Use / Amount: Today. 1 - Method of Aquiring: Purchase. 1- Route of Use: Orally.                       ASAM's:  Six Dimensions of Multidimensional Assessment  Dimension 1:  Acute Intoxication and/or Withdrawal Potential:   Dimension 1:  Description of individual's past and current experiences of substance use and withdrawal: Pt denies withdrawal symptoms.  Dimension 2:  Biomedical Conditions and Complications:   Dimension 2:  Description of patient's biomedical conditions and  complications: Pt reports diabetes and high chlolesterol.  Dimension 3:  Emotional, Behavioral, or Cognitive Conditions and Complications:  Dimension 3:  Description of emotional, behavioral, or cognitive conditions and complications: Pt reports depression and anxiety.  Dimension 4:  Readiness to Change:  Dimension 4:  Description of Readiness to Change criteria: Pt report  Dimension 5:   Relapse, Continued use, or Continued Problem Potential:  Dimension 5:  Relapse, continued use, or continued problem potential critiera description: Pt recently relapsed.  Dimension 6:  Recovery/Living Environment:  Dimension 6:  Recovery/Iiving environment criteria description: Pt reports living in a hotel recently.  ASAM Severity Score: ASAM's Severity Rating Score: 6  ASAM Recommended Level of Treatment:     Substance use Disorder (SUD) Substance Use Disorder (SUD)  Checklist Symptoms of Substance Use: Persistent desire or unsuccessful efforts to cut down or control use  Recommendations for Services/Supports/Treatments: Recommendations for Services/Supports/Treatments Recommendations For Services/Supports/Treatments: Facility Based Crisis  Discharge Disposition:    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

## 2023-10-08 DIAGNOSIS — F1090 Alcohol use, unspecified, uncomplicated: Secondary | ICD-10-CM | POA: Diagnosis not present

## 2023-10-08 LAB — LIPID PANEL
Cholesterol: 172 mg/dL (ref 0–200)
HDL: 37 mg/dL — ABNORMAL LOW (ref >40)
LDL Cholesterol: 81 mg/dL (ref 0–99)
Total CHOL/HDL Ratio: 4.6 {ratio}
Triglycerides: 268 mg/dL — ABNORMAL HIGH (ref <150)
VLDL: 54 mg/dL — ABNORMAL HIGH (ref 0–40)

## 2023-10-08 LAB — TSH: TSH: 2.326 u[IU]/mL (ref 0.350–4.500)

## 2023-10-08 MED ORDER — THIAMINE HCL 100 MG/ML IJ SOLN
100.0000 mg | Freq: Once | INTRAMUSCULAR | Status: DC
  Filled 2023-10-08: qty 2

## 2023-10-08 MED ORDER — NALTREXONE HCL 50 MG PO TABS
50.0000 mg | ORAL_TABLET | Freq: Every day | ORAL | Status: DC
  Administered 2023-10-10 – 2023-10-12 (×3): 50 mg via ORAL
  Filled 2023-10-08 (×3): qty 1

## 2023-10-08 MED ORDER — ADULT MULTIVITAMIN W/MINERALS CH
1.0000 | ORAL_TABLET | Freq: Every day | ORAL | Status: DC
Start: 2023-10-08 — End: 2023-10-13
  Administered 2023-10-08 – 2023-10-13 (×6): 1 via ORAL
  Filled 2023-10-08 (×6): qty 1

## 2023-10-08 MED ORDER — ONDANSETRON 4 MG PO TBDP: 4.0000 mg | ORAL_TABLET | Freq: Four times a day (QID) | ORAL | Status: AC | PRN

## 2023-10-08 MED ORDER — SERTRALINE HCL 50 MG PO TABS
50.0000 mg | ORAL_TABLET | Freq: Every day | ORAL | Status: DC
Start: 2023-10-09 — End: 2023-10-13
  Administered 2023-10-09 – 2023-10-13 (×5): 50 mg via ORAL
  Filled 2023-10-08 (×5): qty 1

## 2023-10-08 MED ORDER — THIAMINE MONONITRATE 100 MG PO TABS
100.0000 mg | ORAL_TABLET | Freq: Every day | ORAL | Status: DC
  Administered 2023-10-09 – 2023-10-13 (×5): 100 mg via ORAL
  Filled 2023-10-08 (×5): qty 1

## 2023-10-08 MED ORDER — HYDROXYZINE HCL 25 MG PO TABS
25.0000 mg | ORAL_TABLET | Freq: Four times a day (QID) | ORAL | Status: AC | PRN
  Administered 2023-10-09: 25 mg via ORAL
  Filled 2023-10-08: qty 1

## 2023-10-08 MED ORDER — NALTREXONE HCL 50 MG PO TABS
25.0000 mg | ORAL_TABLET | Freq: Every day | ORAL | Status: AC
  Administered 2023-10-08 – 2023-10-09 (×2): 25 mg via ORAL
  Filled 2023-10-08 (×2): qty 1

## 2023-10-08 MED ORDER — LORAZEPAM 1 MG PO TABS
1.0000 mg | ORAL_TABLET | Freq: Four times a day (QID) | ORAL | Status: AC | PRN
  Administered 2023-10-09: 1 mg via ORAL

## 2023-10-08 MED ORDER — LORAZEPAM 1 MG PO TABS: 1.0000 mg | ORAL_TABLET | Freq: Every day | ORAL | Status: DC

## 2023-10-08 MED ORDER — BENZONATATE 100 MG PO CAPS
100.0000 mg | ORAL_CAPSULE | Freq: Three times a day (TID) | ORAL | Status: DC | PRN
  Administered 2023-10-09: 100 mg via ORAL
  Filled 2023-10-08: qty 1

## 2023-10-08 MED ORDER — LORAZEPAM 1 MG PO TABS: 1.0000 mg | ORAL_TABLET | Freq: Two times a day (BID) | ORAL | Status: DC

## 2023-10-08 MED ORDER — LOPERAMIDE HCL 2 MG PO CAPS: 2.0000 mg | ORAL_CAPSULE | ORAL | Status: AC | PRN

## 2023-10-08 MED ORDER — LORAZEPAM 1 MG PO TABS
1.0000 mg | ORAL_TABLET | Freq: Three times a day (TID) | ORAL | Status: DC
  Administered 2023-10-10: 1 mg via ORAL
  Filled 2023-10-08 (×2): qty 1

## 2023-10-08 MED ORDER — LORAZEPAM 1 MG PO TABS
1.0000 mg | ORAL_TABLET | Freq: Four times a day (QID) | ORAL | Status: AC
  Administered 2023-10-08 – 2023-10-09 (×5): 1 mg via ORAL
  Filled 2023-10-08 (×5): qty 1

## 2023-10-08 NOTE — ED Notes (Signed)
Patient is calm and cooperative. His mood is depressed, affect congruent. He denies HI or AVH, endorses passive SI with no plan or intent. Patient agrees to make staff aware should these thoughts arise. He denies physical pain or discomfort. No additional needs at this time. Will continue to monitor for safety.

## 2023-10-08 NOTE — ED Provider Notes (Signed)
Facility Based Crisis Admission H&P  Date: 10/08/23 Patient Name: Konrad Desai MRN: [id number] Chief Complaint: alcohol abuse  Diagnoses:  Final diagnoses:  Alcohol use disorder    HPI: Nino Strube is a 47 y/o male presenting voluntarily to [facility] after initially presenting to [facility], ED feeling stressed and anxious after recently moving from [place] to [facility].  Patient reports that he started binge drinking alcohol about a month ago after being sober for about 8 months.  Nurse practitioner assessed patient face-to-face and reviewed his chart.  Patient is alert oriented x 4, calm and cooperative, speech is clear and coherent, thought process is linear and goal-directed, mood is anxious with congruent affect.  Patient denies any SI HI or AVH and does not appear to be responding to any internal or external stimuli at this time.  Patient states that he recently mood from [place] to [place] to be closer to family and home that they have anxiety and depression that he has been feeling would go away.  Patient states his anxiety and depression continue to worsen as his binge drinking increased.  Patient reports would drinking 5-6   Four Loko drinks and 4-5 airplane bottles. Patient reports that he feel like nothing will make him happy which makes him drink alcohol.   Patient reports that he was inpatient in a facility in [place], [place] in October for 5-7 days. Patient reported that he was started on medications. Patient reports that he was started on medication but he stopped taking them soon after discharge because he could handle his problems on his own.   Patient will be admitted to [facility] to be able to safely detox from alcohol.    PHQ 2-9:  Flowsheet Row ED from 10/07/2023 in [facility]  Thoughts that you would be better off dead, or of hurting yourself in some way Not at all  PHQ-9 Total Score 7       Flowsheet Row ED  from 10/07/2023 in [facility] Most recent reading at 10/07/2023 11:49 PM ED from 10/07/2023 in [facility] Emergency Department at [facility] Most recent reading at 10/07/2023  3:58 PM ED from 07/07/2021 in [facility] Emergency Department at [facility] Most recent reading at 07/07/2021  4:06 PM  C-SSRS RISK CATEGORY High Risk High Risk No Risk       Screenings    Flowsheet Row Most Recent Value  CIWA-Ar Total 0       Total Time spent with patient: 20 minutes  Musculoskeletal  Strength & Muscle Tone: within normal limits Gait & Station: normal Patient leans: N/A  Psychiatric Specialty Exam  Presentation General Appearance:  Casual  Eye Contact: Good  Speech: Clear and Coherent  Speech Volume: Normal  Handedness: Right   Mood and Affect  Mood: Depressed  Affect: Appropriate   Thought Process  Thought Processes: Coherent; Goal Directed  Descriptions of Associations:Intact  Orientation:Full (Time, Place and Person)  Thought Content:WDL  Diagnosis of Schizophrenia or Schizoaffective disorder in past: No   Hallucinations:Hallucinations: None  Ideas of Reference:None  Suicidal Thoughts:Suicidal Thoughts: Yes, Passive SI Passive Intent and/or Plan: Without Intent; Without Plan  Homicidal Thoughts:Homicidal Thoughts: No   Sensorium  Memory: Immediate Good  Judgment: Fair  Insight: Fair   Chartered certified accountant: Fair  Attention Span: Fair  Recall: [employer] of Knowledge: Fair  Language: Fair   Psychomotor Activity  Psychomotor Activity: Psychomotor  Activity: Normal   Assets  Assets: Communication Skills; Desire for Improvement; Housing; Physical Health; Resilience   Sleep  Sleep: Sleep: Poor Number of Hours of Sleep: 4   Nutritional Assessment (For OBS and FBC admissions only) Has the patient had a weight loss or gain of 10 pounds or more in the last 3  months?: No Has the patient had a decrease in food intake/or appetite?: No Does the patient have dental problems?: No Does the patient have eating habits or behaviors that may be indicators of an eating disorder including binging or inducing vomiting?: No Has the patient recently lost weight without trying?: 0 Has the patient been eating poorly because of a decreased appetite?: 0 Malnutrition Screening Tool Score: 0    Physical Exam HENT:     Head: Normocephalic.     Nose: Nose normal.  Eyes:     Pupils: Pupils are equal, round, and reactive to light.  Cardiovascular:     Rate and Rhythm: Normal rate.  Pulmonary:     Effort: Pulmonary effort is normal.  Abdominal:     General: Abdomen is flat.  Musculoskeletal:        General: Normal range of motion.     Cervical back: Normal range of motion.  Skin:    General: Skin is warm.  Neurological:     Mental Status: He is alert and oriented to person, place, and time.  Psychiatric:        Attention and Perception: Attention normal.        Mood and Affect: Mood is anxious and depressed.        Speech: Speech normal.        Behavior: Behavior is cooperative.        Thought Content: Thought content is not paranoid or delusional. Thought content does not include homicidal or suicidal ideation. Thought content does not include homicidal or suicidal plan.        Cognition and Memory: Cognition normal.        Judgment: Judgment is impulsive.    Review of Systems  Constitutional: Negative.   HENT: Negative.    Eyes: Negative.   Respiratory: Negative.    Cardiovascular: Negative.   Gastrointestinal: Negative.   Genitourinary: Negative.   Musculoskeletal: Negative.   Skin: Negative.   Neurological: Negative.   Endo/Heme/Allergies: Negative.   Psychiatric/Behavioral:  Positive for depression and substance abuse. The patient is nervous/anxious.     Blood pressure 122/83, pulse (!) 106, temperature 99.1 F (37.3 C), temperature  source Oral, resp. rate 18, SpO2 95%. There is no height or weight on file to calculate BMI.  Past Psychiatric History: Chales Salmon [id number]  Is the patient at risk to self? No  Has the patient been a risk to self in the past 6 months? No .    Has the patient been a risk to self within the distant past? No   Is the patient a risk to others? No   Has the patient been a risk to others in the past 6 months? No   Has the patient been a risk to others within the distant past? No   Past Medical History: T2 Diabetes  Family History: Denies any family history Social History: 47 y/o male single, with a history of alcohol abuse   Last Labs:  Admission on 10/07/2023  Component Date Value Ref Range Status   TSH 10/07/2023 2.326  0.350 - 4.500 uIU/mL Final   Comment: Performed by a 3rd Generation assay with  a functional sensitivity of <=0.01 uIU/mL. Performed at [facility], [address] [place], [place] [address]    Cholesterol 10/07/2023 172  0 - 200 mg/dL Final   Triglycerides 60/45/4098 268 (H)  <150 mg/dL Final   HDL 11/91/4782 37 (L)  >40 mg/dL Final   Total CHOL/HDL Ratio 10/07/2023 4.6  RATIO Final   VLDL 10/07/2023 54 (H)  0 - 40 mg/dL Final   LDL Cholesterol 10/07/2023 81  0 - 99 mg/dL Final   Comment:        Total Cholesterol/HDL:CHD Risk Coronary Heart Disease Risk Table                     Men   Women  1/2 Average Risk   3.4   3.3  Average Risk       5.0   4.4  2 X Average Risk   9.6   7.1  3 X Average Risk  23.4   11.0        Use the calculated Patient Ratio above and the CHD Risk Table to determine the patient's CHD Risk.        ATP III CLASSIFICATION (LDL):  <100     mg/dL   Optimal  956-213  mg/dL   Near or Above                    Optimal  130-159  mg/dL   Borderline  086-578  mg/dL   High  >469     mg/dL   Very High Performed at [facility], [address], [place], [place] [address]   Admission on 10/07/2023, Discharged on 10/07/2023   Component Date Value Ref Range Status   Sodium 10/07/2023 136  135 - 145 mmol/L Final   Potassium 10/07/2023 3.2 (L)  3.5 - 5.1 mmol/L Final   Chloride 10/07/2023 97 (L)  98 - 111 mmol/L Final   CO2 10/07/2023 23  22 - 32 mmol/L Final   Glucose, Bld 10/07/2023 141 (H)  70 - 99 mg/dL Final   Glucose reference range applies only to samples taken after fasting for at least 8 hours.   BUN 10/07/2023 16  6 - 20 mg/dL Final   Creatinine, Ser 10/07/2023 0.77  0.61 - 1.24 mg/dL Final   Calcium 84/13/2440 8.9  8.9 - 10.3 mg/dL Final   Total Protein 09/17/2535 8.5 (H)  6.5 - 8.1 g/dL Final   Albumin 64/40/3474 4.0  3.5 - 5.0 g/dL Final   AST 25/95/6387 29  15 - 41 U/L Final   ALT 10/07/2023 35  0 - 44 U/L Final   Alkaline Phosphatase 10/07/2023 89  38 - 126 U/L Final   Total Bilirubin 10/07/2023 0.5  <1.2 mg/dL Final   GFR, Estimated 10/07/2023 >60  >60 mL/min Final   Comment: (NOTE) Calculated using the CKD-EPI Creatinine Equation (2021)    Anion gap 10/07/2023 16 (H)  5 - 15 Final   Performed at [facility], [address], [place], [place] [address]   Alcohol, Ethyl (B) 10/07/2023 60 (H)  <10 mg/dL Final   Comment: (NOTE) Lowest detectable limit for serum alcohol is 10 mg/dL.  For medical purposes only. Performed at [facility], [address], [place], [place] [address]    Opiates 10/07/2023 NONE DETECTED  NONE DETECTED Final   Cocaine 10/07/2023 NONE DETECTED  NONE DETECTED Final   Benzodiazepines 10/07/2023 POSITIVE (A)  NONE DETECTED Final  Amphetamines 10/07/2023 NONE DETECTED  NONE DETECTED Final   Tetrahydrocannabinol 10/07/2023 NONE DETECTED  NONE DETECTED Final   Barbiturates 10/07/2023 NONE DETECTED  NONE DETECTED Final   Comment: (NOTE) DRUG SCREEN FOR MEDICAL PURPOSES ONLY.  IF CONFIRMATION IS NEEDED FOR ANY PURPOSE, NOTIFY LAB WITHIN 5 DAYS.  LOWEST DETECTABLE LIMITS FOR URINE DRUG SCREEN Drug Class                      Cutoff (ng/mL) Amphetamine and metabolites    1000 Barbiturate and metabolites    200 Benzodiazepine                 200 Opiates and metabolites        300 Cocaine and metabolites        300 THC                            50 Performed at [facility], [address] [place], [place] [address]    WBC 10/07/2023 9.8  4.0 - 10.5 K/uL Final   RBC 10/07/2023 4.90  4.22 - 5.81 MIL/uL Final   Hemoglobin 10/07/2023 14.4  13.0 - 17.0 g/dL Final   HCT 60/45/4098 43.1  39.0 - 52.0 % Final   MCV 10/07/2023 88.0  80.0 - 100.0 fL Final   MCH 10/07/2023 29.4  26.0 - 34.0 pg Final   MCHC 10/07/2023 33.4  30.0 - 36.0 g/dL Final   RDW 11/91/4782 15.1  11.5 - 15.5 % Final   Platelets 10/07/2023 414 (H)  150 - 400 K/uL Final   nRBC 10/07/2023 0.0  0.0 - 0.2 % Final   Neutrophils Relative % 10/07/2023 65  % Final   Neutro Abs 10/07/2023 6.3  1.7 - 7.7 K/uL Final   Lymphocytes Relative 10/07/2023 27  % Final   Lymphs Abs 10/07/2023 2.7  0.7 - 4.0 K/uL Final   Monocytes Relative 10/07/2023 6  % Final   Monocytes Absolute 10/07/2023 0.6  0.1 - 1.0 K/uL Final   Eosinophils Relative 10/07/2023 2  % Final   Eosinophils Absolute 10/07/2023 0.2  0.0 - 0.5 K/uL Final   Basophils Relative 10/07/2023 0  % Final   Basophils Absolute 10/07/2023 0.0  0.0 - 0.1 K/uL Final   Immature Granulocytes 10/07/2023 0  % Final   Abs Immature Granulocytes 10/07/2023 0.03  0.00 - 0.07 K/uL Final   Performed at [facility], [address], [place], [place] [address]   Acetaminophen (Tylenol), Serum 10/07/2023 <10 (L)  10 - 30 ug/mL Final   Comment: (NOTE) Therapeutic concentrations vary significantly. A range of 10-30 ug/mL  may be an effective concentration for many patients. However, some  are best treated at concentrations outside of this range. Acetaminophen concentrations >150 ug/mL at 4 hours after ingestion  and >50 ug/mL at 12 hours after ingestion are often associated with   toxic reactions.  Performed at [facility], [address] [place], [place] [address]    Salicylate Lvl 10/07/2023 <7.0 (L)  7.0 - 30.0 mg/dL Final   Performed at [facility], [address], [place], [place] [address]    Allergies: Patient has no known allergies.  Medications:  Facility Ordered Medications  Medication   [COMPLETED] potassium chloride SA (KLOR-CON M) CR tablet 40 mEq   acetaminophen (TYLENOL) tablet 650 mg   alum & mag hydroxide-simeth (MAALOX/MYLANTA) 200-200-20 MG/5ML suspension 30 mL  magnesium hydroxide (MILK OF MAGNESIA) suspension 30 mL   traZODone (DESYREL) tablet 50 mg   thiamine (VITAMIN B1) injection 100 mg   [START ON 10/09/2023] thiamine (VITAMIN B1) tablet 100 mg   multivitamin with minerals tablet 1 tablet   LORazepam (ATIVAN) tablet 1 mg   hydrOXYzine (ATARAX) tablet 25 mg   loperamide (IMODIUM) capsule 2-4 mg   ondansetron (ZOFRAN-ODT) disintegrating tablet 4 mg   LORazepam (ATIVAN) tablet 1 mg   Followed by   Melene Muller ON 10/09/2023] LORazepam (ATIVAN) tablet 1 mg   Followed by   Melene Muller ON 10/10/2023] LORazepam (ATIVAN) tablet 1 mg   Followed by   Melene Muller ON 10/12/2023] LORazepam (ATIVAN) tablet 1 mg    Long Term Goals: Improvement in symptoms so as ready for discharge  Short Term Goals: Patient will verbalize feelings in meetings with treatment team members., Patient will attend at least of 50% of the groups daily., Pt will complete the PHQ9 on admission, day 3 and discharge., and Patient will take medications as prescribed daily.  Medical Decision Making  Welford Guzek is a 47 y/o male presenting voluntarily to [facility] after initially presenting to [facility], ED feeling stressed and anxious after recently moving from [place] to [place].  Patient reports that he started binge drinking alcohol about a month ago after being sober for about 8 months.    Recommendations  Based on my  evaluation the patient does not appear to have an emergency medical condition.  Patient will be admitted to [facility] to be able to safely detox from alcohol.   Jasper Riling, NP 10/08/23  3:41 AM

## 2023-10-08 NOTE — ED Provider Notes (Signed)
[facility] Progress Note  Date & Time: 10/08/2023 6:35 PM Name: Aaron [name] Age: 47 y.o.  DOB: [birth year]  MRN: [id number]  Diagnosis:  Final diagnoses:  Alcohol use disorder  Alcohol-induced depressive disorder with moderate or severe use disorder with onset during intoxication [facility])    Reason for presentation: Anxiety, Stress, and Addiction Problem  Brief HPI  Aaron [name] is a 47 y.o. male, with PMH alcohol use d/o (no h/o sz or DT), SIMD (alcohol), housing instability, past IP psych admission (08/2023 in [place], [place]), who presented Voluntary to [facility] (10/08/2023) as a transfer from [facility] ED then admitted to [facility] for alcohol detox and treatment for depression. He is interested in residential rehab.      Component Value Date/Time   ETH 60 (H) 10/07/2023 1701       Component Value Date/Time   LABOPIA NONE DETECTED 10/07/2023 1705   COCAINSCRNUR NONE DETECTED 10/07/2023 1705   LABBENZ POSITIVE (A) 10/07/2023 1705   AMPHETMU NONE DETECTED 10/07/2023 1705   THCU NONE DETECTED 10/07/2023 1705   LABBARB NONE DETECTED 10/07/2023 1705     Interval Hx   Patient Narrative:  On assessment today, patient reports that he moved to [place] from [place] to be closer to his family, and as he was trying to escape after relapsing. After 8 months of sobriety, he relapsed on Sat, 11/9. By Sunday night, he was on a bus to come to [place]. He drank during the ride and really increased his consumption on Monday. This "binge" culminated with him deciding again that he wanted better for himself.   He reports that he was able to maintain his sobriety by keeping busy with achieving the goals he set for himself. He also liked the feeling of being sober. He is unsure what triggered his relapse.   He reports that after his relapse began, is when the depressive symptoms and anxiety emerged. He began having difficulties falling asleep due to racing  thoughts and constantly felt overwhelmed throughout the day due to worries about what other people would say about him.   He does endorse passive SI but denies desire or intent, HI, and AVH.   He endorses some nausea but denies further withdrawal symptoms. Objectively, no withdrawal symptoms are evident. Review of Systems  Constitutional:  Negative for chills, diaphoresis, fever and malaise/fatigue.  Respiratory:  Positive for cough and sputum production. Negative for shortness of breath.   Cardiovascular:  Negative for chest pain.  Gastrointestinal:  Positive for nausea. Negative for abdominal pain, constipation, diarrhea and vomiting.  Musculoskeletal:  Positive for back pain.       Chronic  Neurological:  Negative for dizziness, tremors, seizures, weakness and headaches.     Past History   Psychiatric History:  Dx: Depression, alcohol use disorder Prior Rx: Maybe sertraline OP psychiatrist: Denies OP therapist: Denies PCP: Patient, No Pcp Per  Suicide Attempt: Denies Inpatient psych: Yes, 08/2023 in [place], [place]  Psychiatric Family History:  Unsure  Social History:   Living: In hotel since returning to [facility] on Sunday night Income: None currently Trauma: Denies Social support: Mother and other family members  EtOH:  reports current alcohol use.  Tobacco:  reports that he has been smoking cigarettes. He has never used smokeless tobacco.  Cannabis: Denies Opiates: Denies Stimulants: Denies BZO/hypnotics: Denies Seizure/DT: Denies  Treatments: Denies, but chart shows [facility] IVDU: Denies  Past Medical History:  Past Medical History:  Diagnosis Date   Depression    Diabetes mellitus without complication (HCC)    Hypertension    No past surgical history on file. Family History:  Family History  Problem Relation Age of Onset   Cancer Father    Social History   Substance and Sexual Activity  Alcohol Use Yes   Comment: heavy    Social History    Substance and Sexual Activity  Drug Use No    Social History   Socioeconomic History   Marital status: Single    Spouse name: Not on file   Number of children: Not on file   Years of education: Not on file   Highest education level: Not on file  Occupational History   Not on file  Tobacco Use   Smoking status: Every Day    Current packs/day: 0.25    Types: Cigarettes   Smokeless tobacco: Never  Vaping Use   Vaping status: Never Used  Substance and Sexual Activity   Alcohol use: Yes    Comment: heavy   Drug use: No   Sexual activity: Not on file  Other Topics Concern   Not on file  Social History Narrative   Not on file   Social Determinants of Health   Financial Resource Strain: Not on file  Food Insecurity: Food Insecurity Present (10/07/2023)   Hunger Vital Sign    Worried About Running Out of Food in the Last Year: Sometimes true    Ran Out of Food in the Last Year: Never true  Transportation Needs: No Transportation Needs (10/07/2023)   PRAPARE - Administrator, Civil Service (Medical): No    Lack of Transportation (Non-Medical): No  Physical Activity: Not on file  Stress: Not on file  Social Connections: Unknown (08/13/2021)   Received from [facility]   Social Connections    Frequency of Communication with Friends and Family: Not asked    Frequency of Social Gatherings with Friends and Family: Not asked   SDOH: SDOH Screenings   Food Insecurity: Food Insecurity Present (10/07/2023)  Housing: Low Risk  (10/07/2023)  Transportation Needs: No Transportation Needs (10/07/2023)  Utilities: Not At Risk (10/07/2023)  Alcohol Screen: Medium Risk (04/08/2021)  Depression (PHQ2-9): Medium Risk (10/08/2023)  Social Connections: Unknown (08/13/2021)   Received from [facility]  Tobacco Use: High Risk (10/07/2023)   Additional Social History: Alcohol / Drug Use Pain Medications: Unnamed antidepressant. Prescriptions: Diabetes and High  choloesteral medication. Over the Counter: None. History of alcohol / drug use?: Yes Longest period of sobriety (when/how long): 5 months. Negative Consequences of Use:  (None identified.) Withdrawal Symptoms: None Substance #1 Name of Substance 1: Alcohol. 1 - Age of First Use: 22. 1 - Amount (size/oz): Unknown amount. 1 - Frequency: Daily. 1 - Duration: 1 month. 1 - Last Use / Amount: Today. 1 - Method of Aquiring: Purchase. 1- Route of Use: Orally. Current Medications   Current Facility-Administered Medications  Medication Dose Route Frequency Provider Last Rate Last Admin   acetaminophen (TYLENOL) tablet 650 mg  650 mg Oral Q6H PRN Bobbitt, Shalon E, NP       alum & mag hydroxide-simeth (MAALOX/MYLANTA) 200-200-20 MG/5ML suspension 30 mL  30 mL Oral Q4H PRN Bobbitt, Shalon E, NP       hydrOXYzine (ATARAX) tablet 25 mg  25 mg Oral Q6H PRN Bobbitt, Shalon E, NP       loperamide (IMODIUM) capsule 2-4 mg  2-4 mg Oral PRN Bobbitt, Franchot Mimes, NP  LORazepam (ATIVAN) tablet 1 mg  1 mg Oral Q6H PRN Bobbitt, Shalon E, NP       LORazepam (ATIVAN) tablet 1 mg  1 mg Oral QID Bobbitt, Shalon E, NP   1 mg at 10/08/23 1528   Followed by   Melene Muller ON 10/09/2023] LORazepam (ATIVAN) tablet 1 mg  1 mg Oral TID Bobbitt, Shalon E, NP       Followed by   Melene Muller ON 10/10/2023] LORazepam (ATIVAN) tablet 1 mg  1 mg Oral BID Bobbitt, Shalon E, NP       Followed by   Melene Muller ON 10/12/2023] LORazepam (ATIVAN) tablet 1 mg  1 mg Oral Daily Bobbitt, Shalon E, NP       magnesium hydroxide (MILK OF MAGNESIA) suspension 30 mL  30 mL Oral Daily PRN Bobbitt, Shalon E, NP       multivitamin with minerals tablet 1 tablet  1 tablet Oral Daily Bobbitt, Shalon E, NP   1 tablet at 10/08/23 1026   naltrexone (DEPADE) tablet 25 mg  25 mg Oral QHS Lamar Sprinkles, MD       Followed by   Melene Muller ON 10/10/2023] naltrexone (DEPADE) tablet 50 mg  50 mg Oral QHS Lamar Sprinkles, MD       ondansetron (ZOFRAN-ODT)  disintegrating tablet 4 mg  4 mg Oral Q6H PRN Bobbitt, Shalon E, NP       [START ON 10/09/2023] sertraline (ZOLOFT) tablet 50 mg  50 mg Oral Daily Glendell Fouse, Toni Amend, MD       thiamine (VITAMIN B1) injection 100 mg  100 mg Intramuscular Once Bobbitt, Shalon E, NP       [START ON 10/09/2023] thiamine (VITAMIN B1) tablet 100 mg  100 mg Oral Daily Bobbitt, Shalon E, NP       traZODone (DESYREL) tablet 50 mg  50 mg Oral QHS PRN Bobbitt, Shalon E, NP       Current Outpatient Medications  Medication Sig Dispense Refill   atorvastatin (LIPITOR) 40 MG tablet Take 40 mg by mouth daily.     metFORMIN (GLUCOPHAGE-XR) 500 MG 24 hr tablet Take 1,000 mg by mouth daily with breakfast.     sertraline (ZOLOFT) 50 MG tablet Take 50 mg by mouth daily.      Labs / Images  Lab Results:  Admission on 10/07/2023  Component Date Value Ref Range Status   TSH 10/07/2023 2.326  0.350 - 4.500 uIU/mL Final   Comment: Performed by a 3rd Generation assay with a functional sensitivity of <=0.01 uIU/mL. Performed at [facility], [address], [place], [place] [address]    Cholesterol 10/07/2023 172  0 - 200 mg/dL Final   Triglycerides 21/30/8657 268 (H)  <150 mg/dL Final   HDL 84/69/6295 37 (L)  >40 mg/dL Final   Total CHOL/HDL Ratio 10/07/2023 4.6  RATIO Final   VLDL 10/07/2023 54 (H)  0 - 40 mg/dL Final   LDL Cholesterol 10/07/2023 81  0 - 99 mg/dL Final   Comment:        Total Cholesterol/HDL:CHD Risk Coronary Heart Disease Risk Table                     Men   Women  1/2 Average Risk   3.4   3.3  Average Risk       5.0   4.4  2 X Average Risk   9.6   7.1  3 X Average Risk  23.4   11.0  Use the calculated Patient Ratio above and the CHD Risk Table to determine the patient's CHD Risk.        ATP [name] CLASSIFICATION (LDL):  <100     mg/dL   Optimal  244-010  mg/dL   Near or Above                    Optimal  130-159  mg/dL   Borderline  272-536  mg/dL   High  >644     mg/dL   Very  High Performed at [facility], [address], [place], [place] [address]   Admission on 10/07/2023, Discharged on 10/07/2023  Component Date Value Ref Range Status   Sodium 10/07/2023 136  135 - 145 mmol/L Final   Potassium 10/07/2023 3.2 (L)  3.5 - 5.1 mmol/L Final   Chloride 10/07/2023 97 (L)  98 - 111 mmol/L Final   CO2 10/07/2023 23  22 - 32 mmol/L Final   Glucose, Bld 10/07/2023 141 (H)  70 - 99 mg/dL Final   Glucose reference range applies only to samples taken after fasting for at least 8 hours.   BUN 10/07/2023 16  6 - 20 mg/dL Final   Creatinine, Ser 10/07/2023 0.77  0.61 - 1.24 mg/dL Final   Calcium 25/95/6387 8.9  8.9 - 10.3 mg/dL Final   Total Protein 56/43/3295 8.5 (H)  6.5 - 8.1 g/dL Final   Albumin 18/84/1660 4.0  3.5 - 5.0 g/dL Final   AST 63/11/6008 29  15 - 41 U/L Final   ALT 10/07/2023 35  0 - 44 U/L Final   Alkaline Phosphatase 10/07/2023 89  38 - 126 U/L Final   Total Bilirubin 10/07/2023 0.5  <1.2 mg/dL Final   GFR, Estimated 10/07/2023 >60  >60 mL/min Final   Comment: (NOTE) Calculated using the CKD-EPI Creatinine Equation (2021)    Anion gap 10/07/2023 16 (H)  5 - 15 Final   Performed at [facility], [address], [place], [place] [address]   Alcohol, Ethyl (B) 10/07/2023 60 (H)  <10 mg/dL Final   Comment: (NOTE) Lowest detectable limit for serum alcohol is 10 mg/dL.  For medical purposes only. Performed at [facility], [address], [place], [place] [address]    Opiates 10/07/2023 NONE DETECTED  NONE DETECTED Final   Cocaine 10/07/2023 NONE DETECTED  NONE DETECTED Final   Benzodiazepines 10/07/2023 POSITIVE (A)  NONE DETECTED Final   Amphetamines 10/07/2023 NONE DETECTED  NONE DETECTED Final   Tetrahydrocannabinol 10/07/2023 NONE DETECTED  NONE DETECTED Final   Barbiturates 10/07/2023 NONE DETECTED  NONE DETECTED Final   Comment: (NOTE) DRUG SCREEN FOR MEDICAL PURPOSES ONLY.  IF CONFIRMATION IS  NEEDED FOR ANY PURPOSE, NOTIFY LAB WITHIN 5 DAYS.  LOWEST DETECTABLE LIMITS FOR URINE DRUG SCREEN Drug Class                     Cutoff (ng/mL) Amphetamine and metabolites    1000 Barbiturate and metabolites    200 Benzodiazepine                 200 Opiates and metabolites        300 Cocaine and metabolites        300 THC                            50 Performed at [facility], [address] [place], [place] [address]    WBC 10/07/2023 9.8  4.0 - 10.5 K/uL Final   RBC 10/07/2023 4.90  4.22 - 5.81 MIL/uL Final   Hemoglobin 10/07/2023 14.4  13.0 - 17.0 g/dL Final   HCT 33/29/5188 43.1  39.0 - 52.0 % Final   MCV 10/07/2023 88.0  80.0 - 100.0 fL Final   MCH 10/07/2023 29.4  26.0 - 34.0 pg Final   MCHC 10/07/2023 33.4  30.0 - 36.0 g/dL Final   RDW 41/66/0630 15.1  11.5 - 15.5 % Final   Platelets 10/07/2023 414 (H)  150 - 400 K/uL Final   nRBC 10/07/2023 0.0  0.0 - 0.2 % Final   Neutrophils Relative % 10/07/2023 65  % Final   Neutro Abs 10/07/2023 6.3  1.7 - 7.7 K/uL Final   Lymphocytes Relative 10/07/2023 27  % Final   Lymphs Abs 10/07/2023 2.7  0.7 - 4.0 K/uL Final   Monocytes Relative 10/07/2023 6  % Final   Monocytes Absolute 10/07/2023 0.6  0.1 - 1.0 K/uL Final   Eosinophils Relative 10/07/2023 2  % Final   Eosinophils Absolute 10/07/2023 0.2  0.0 - 0.5 K/uL Final   Basophils Relative 10/07/2023 0  % Final   Basophils Absolute 10/07/2023 0.0  0.0 - 0.1 K/uL Final   Immature Granulocytes 10/07/2023 0  % Final   Abs Immature Granulocytes 10/07/2023 0.03  0.00 - 0.07 K/uL Final   Performed at [facility], [address], [place], [place] [address]   Acetaminophen (Tylenol), Serum 10/07/2023 <10 (L)  10 - 30 ug/mL Final   Comment: (NOTE) Therapeutic concentrations vary significantly. A range of 10-30 ug/mL  may be an effective concentration for many patients. However, some  are best treated at concentrations outside of this  range. Acetaminophen concentrations >150 ug/mL at 4 hours after ingestion  and >50 ug/mL at 12 hours after ingestion are often associated with  toxic reactions.  Performed at [facility], [address] [place], [place] [address]    Salicylate Lvl 10/07/2023 <7.0 (L)  7.0 - 30.0 mg/dL Final   Performed at [facility], [address], [place], [place] [address]   Blood Alcohol level:  Lab Results  Component Value Date   ETH 60 (H) 10/07/2023   ETH <10 05/10/2021   Metabolic Disorder Labs: No results found for: "HGBA1C", "MPG" No results found for: "PROLACTIN" Lab Results  Component Value Date   CHOL 172 10/07/2023   TRIG 268 (H) 10/07/2023   HDL 37 (L) 10/07/2023   CHOLHDL 4.6 10/07/2023   VLDL 54 (H) 10/07/2023   LDLCALC 81 10/07/2023   Therapeutic Lab Levels: No results found for: "LITHIUM" No results found for: "VALPROATE" No results found for: "CBMZ" Physical Findings   AIMS    Flowsheet Row [facility]) [facility]) [facility]  AIMS Total Score 0 0      AUDIT    Flowsheet Row [facility]) [facility] Ad[facility] ([facility]) [facility]  Alcohol Use Disorder Identification Test Final Score (AUDIT) 30 27      PHQ2-9    Flowsheet Row ED [facility]07[facility]20[facility] in [facility]  PHQ-2 Total Score 2  PHQ-9 Total Score 7      Flowsheet Row ED [facility]07/2023 in [facility] Most recent reading at 10/07/2023 11:49 PM ED from 10/07/2023 in [facility] Department at [facility] Most recent reading at 10/07/2023  3:58 PM ED from 07/07/2021 in [facility] Department at [facility] Most recent  reading at 07/07/2021  4:06 PM  C-SSRS RISK CATEGORY High Risk High Risk No Risk       Musculoskeletal  Strength & Muscle Tone: within normal limits Gait & Station: normal Patient leans: N/A   Strength & Muscle Tone: within normal limits Gait & Station: normal Patient leans: N/A  Psychiatric Specialty Exam   Presentation  General Appearance:Appropriate for Environment, Casual Eye Contact:Good Speech:Clear and Coherent, Normal Rate Volume:Normal Handedness:Right  Mood and Affect  Mood:Depressed, Anxious Affect:Congruent, Depressed  Thought Process  Thought Process:Coherent, Linear, Goal Directed Descriptions of Associations:Intact  Thought Content Suicidal Thoughts:Suicidal Thoughts: Yes, Passive SI Passive Intent and/or Plan: Without Intent, Without Plan Homicidal Thoughts:Homicidal Thoughts: No Hallucinations:Hallucinations: None Ideas of Reference:None Thought Content:Logical, WDL  Sensorium  Memory:Immediate Good, Recent Good Judgment:Fair Insight:Fair  Executive Functions  Orientation:Full (Time, Place and Person) Language:Fair Concentration:Fair Attention:Fair Recall:Fair Fund of Knowledge:Good  Psychomotor Activity  Psychomotor Activity:Psychomotor Activity: Normal  Assets  Assets:Communication Skills, Desire for Improvement, Social Support, Resilience  Sleep  Quality:Poor  Physical Exam and Labs  BP 113/75 (BP Location: Left Arm)   Pulse 86   Temp 98.7 F (37.1 C) (Oral)   Resp 17   SpO2 98%  Physical Exam Vitals reviewed.  Constitutional:      General: He is not in acute distress. HENT:     Head: Normocephalic and atraumatic.  Pulmonary:     Effort: Pulmonary effort is normal.  Skin:    General: Skin is warm and dry.  Neurological:     General: No focal deficit present.     Mental Status: He is alert and oriented to person, place, and time.     Motor: No weakness.     Gait: Gait normal.       EKG: normal sinus rhythm,  nonspecific T waves changes, QTc 435.   Assessment / Plan  Total Time spent with patient: 1 hour Treatment Plan Summary: Daily contact with patient to assess and evaluate symptoms and progress in treatment and Medication management  Principal Problem:   Alcohol dependence (HCC)   Aaron [name] is a 47 y.o. male with  history of alcohol use disorder with recent relapse after 8 months leading to alcohol-induced depression and anxiety. Patient motivated to achieve sobriety and would like to pursue residential treatment at this time. He would like for mood symptoms to be addressed and is advised that alcohol cessation would improve his symptoms significantly, but in the interim, may aid in depressive symptoms. Zoloft and Naltrexone r/b/ae discussed with patient, and he is agreeable to a trial. Patient denies hx of opiate use, UDS pos for BZD (received in ED), and LFTs WNL. Will proceed with Naltrexone at this time. LCSW to assist with disposition planning upon return tomorrow.   #Alcohol Use Disorder #Alcohol-Induced Depression and Anxiety CIWA Ativan protocol initiated: - lorazepam (Ativan) 1 mg 4 times daily x4 doses, 1 mg 3 times daily x3 doses, 1 mg 2 times daily x2 doses, 1 mg daily x1 dose -lorazepam (Ativan) 1 mg every 6 hours as needed for CIWA greater than 10; Hydroxyzine 25 mg for CIWA less than 10 -Multivitamin with minerals 1 tablet daily -Ondansetron disintegrating tablet 4 mg every 6 as needed/nausea or vomiting -Loperamide  2 to 4 mg oral as needed/diarrhea or loose stools -Thiamine injection 100 mg IM once followed by Thiamine tablet 100 mg daily. -- START Zoloft 50 mg tomorrow for depressive and anxiety sx -- START Naltrexone 25 mg at bedtime x 2 days, then increase to 50 mg at bedtime for alcohol use disorder.   #Hist of HTN --24 hour BP 110-122/75-83; MR 120/80. Patient reports home medication Lisinopril (unsure of medication and dosage).  -As patient normotensive,  will refrain from starting at this time.   #Hist of T2DM --A1c pending. Patient reports home medication Metformin 1000 mg daily. Will decide whether to restart when A1c results   #Hypertriglyceridemia -- TG 268 this admission.  Likely elevated in the setting of chronic alcohol use, obesity, and other factors.  -- Primary team recommended to advise PCP follow-up.   Considerations for follow-up: Recommend monitoring LFT every 6-12 months while on naltrexone  DISPO:  amenable to residential rehab. Will attempt to dc patient door-to-door, however if unable will discuss with CD-IOP as bridge to rehab.  Tentative date: TBA Location: TBA   Signed: Lamar Sprinkles, MD Psychiatry Resident, PGY-3 10/08/2023, 6:35 PM   [facility] [address] [place], [place] [address] Dept: [phone] Dept Fax: [phone]

## 2023-10-08 NOTE — Group Note (Signed)
Group Topic: Communication  Group Date: 10/08/2023 Start Time: 2030 End Time: 2100 Facilitators: Rae Lips B  Department: [facility]  Number of Participants: 1  Group Focus: abuse issues, acceptance, activities of daily living skills, and chemical dependency education Treatment Modality:  Individual Therapy Interventions utilized were leisure development Purpose: express feelings  Name: Jheremy Kludt III Date of Birth: 20-Oct-1976  MR: [id number]    Level of Participation: PT DID NOT ATTEND GROUP Quality of Participation: cooperative Interactions with others: gave feedback Mood/Affect: appropriate Triggers (if applicable): NA Cognition: coherent/clear Progress: None  Response: NA  Plan: patient will be encouraged to keep going to group.   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

## 2023-10-08 NOTE — ED Notes (Signed)
Patient observed resting quietly, eyes closed. Respirations equal and unlabored. Will continue to monitor for safety.  

## 2023-10-08 NOTE — ED Notes (Signed)
Patient was provided breakfast

## 2023-10-08 NOTE — Group Note (Signed)
Group Topic: Healthy Self Image and Positive Change  Group Date: 10/08/2023 Start Time: 1730 End Time: 1800 Facilitators: Olin Hauser, RN  Department: [facility]  Number of Participants: 5  Group Focus: acceptance, affirmation, clarity of thought, coping skills, healthy friendships, and relapse prevention Treatment Modality:  Behavior Modification Therapy and Cognitive Behavioral Therapy Interventions utilized were confrontation and exploration Purpose: enhance coping skills, explore maladaptive thinking, express feelings, increase insight, and regain self-worth  Name: Aaron [name] Date of Birth: Nov 03, 1976  MR: [id number]    Level of Participation: active Quality of Participation: cooperative and motivated Interactions with others: gave feedback Mood/Affect: appropriate and positive Triggers (if applicable): None Cognition: coherent/clear, goal directed, and insightful Progress: Significant Response: Patient was actively engaged and provided feedback Plan: patient will be encouraged to Continue to optimistic about sobriety.  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

## 2023-10-09 DIAGNOSIS — F1090 Alcohol use, unspecified, uncomplicated: Secondary | ICD-10-CM | POA: Diagnosis not present

## 2023-10-09 NOTE — Group Note (Signed)
Group Topic: Communication  Group Date: 10/09/2023 Start Time: 2000 End Time: 2100 Facilitators: Rae Lips B  Department: [facility]  Number of Participants: 1  Group Focus: abuse issues, acceptance, activities of daily living skills, communication, concentration, coping skills, daily focus, discharge education, and individual meeting Treatment Modality:  Leisure Counsellor and Patient-Centered Therapy Interventions utilized were leisure development, patient education, problem solving, reality testing, story telling, and support Purpose: enhance coping skills, express feelings, increase insight, reinforce self-care, and relapse prevention strategies  Name: Aaron [name] Date of Birth: June 19, 1976  MR: [id number]    Level of Participation: PT DID NOT ATTEND GROUP Quality of Participation: attentive and cooperative Interactions with others: gave feedback Mood/Affect: appropriate Triggers (if applicable): NA Cognition: coherent/clear Progress: None Response: NA Plan: patient will be encouraged to go to groups.   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

## 2023-10-09 NOTE — ED Notes (Signed)
Patient requested of nurse for clothes from locker, stating due to lack of clothes patient had not showered since arrival. Clothes checked and brought over to unit, given to patient. Patient thanked Clinical research associate and went to shower room. No complaints at this time. Environment secured, safety checks in place per facility policy.

## 2023-10-09 NOTE — ED Provider Notes (Signed)
[facility] Based Crisis Behavioral Health Progress Note  Date & Time: 10/09/2023 8:29 AM Name: Aaron [name] Age: 47 y.o.  DOB: September 30, 1976  MRN: [id number]  Diagnosis:  Final diagnoses:  Alcohol use disorder  Alcohol-induced depressive disorder with moderate or severe use disorder with onset during intoxication [facility])    Reason for presentation: Anxiety, Stress, and Addiction Problem  Brief HPI  Aaron [name] is a 47 y.o. male, with PMH alcohol use d/o (no h/o sz or DT), SIMD (alcohol), housing instability, past IP psych admission (08/2023 in [place], [place]), who presented Voluntary to [facility] (10/09/2023) as a transfer from [facility] ED then admitted to [facility] for alcohol detox and treatment for depression. He is interested in residential rehab.      Component Value Date/Time   ETH 60 (H) 10/07/2023 1701       Component Value Date/Time   LABOPIA NONE DETECTED 10/07/2023 1705   COCAINSCRNUR NONE DETECTED 10/07/2023 1705   LABBENZ POSITIVE (A) 10/07/2023 1705   AMPHETMU NONE DETECTED 10/07/2023 1705   THCU NONE DETECTED 10/07/2023 1705   LABBARB NONE DETECTED 10/07/2023 1705     Interval Hx   Patient Narrative:  On assessment today, patient reports that he is still feeling depressed and being treated for alcohol withdrawal. He does not report any symptoms of withdrawal at this time and states that the treatment that he is receiving has been adequate. He has been eating and sleeping fine. His goal is to get into a rehab program and he is waiting to speak with a caseworker about potential options.  Review of Systems  Constitutional:  Negative for chills, diaphoresis, fever and malaise/fatigue.  Respiratory:  Negative for shortness of breath.   Cardiovascular:  Negative for chest pain.  Gastrointestinal:  Negative for abdominal pain, constipation, diarrhea and vomiting.  Musculoskeletal:        Chronic  Neurological:  Negative for dizziness,  tremors, seizures, weakness and headaches.     Past History   Psychiatric History:  Dx: Depression, alcohol use disorder Prior Rx: Maybe sertraline OP psychiatrist: Denies OP therapist: Denies PCP: Patient, No Pcp Per  Suicide Attempt: Denies Inpatient psych: Yes, 08/2023 in [place], [place]  Psychiatric Family History:  Unsure  Social History:   Living: In hotel since returning to [facility] on Sunday night Income: None currently Trauma: Denies Social support: Mother and other family members  EtOH:  reports current alcohol use.  Tobacco:  reports that he has been smoking cigarettes. He has never used smokeless tobacco.  Cannabis: Denies Opiates: Denies Stimulants: Denies BZO/hypnotics: Denies Seizure/DT: Denies  Treatments: Denies, but chart shows [facility] IVDU: Denies  Past Medical History:  Past Medical History:  Diagnosis Date   Depression    Diabetes mellitus without complication (HCC)    Hypertension    No past surgical history on file. Family History:  Family History  Problem Relation Age of Onset   Cancer Father    Social History   Substance and Sexual Activity  Alcohol Use Yes   Comment: heavy    Social History   Substance and Sexual Activity  Drug Use No    Social History   Socioeconomic History   Marital status: Single    Spouse name: Not on file   Number of children: Not on file   Years of education: Not on file   Highest education level: Not on file  Occupational History   Not on file  Tobacco Use  Smoking status: Every Day    Current packs/day: 0.25    Types: Cigarettes   Smokeless tobacco: Never  Vaping Use   Vaping status: Never Used  Substance and Sexual Activity   Alcohol use: Yes    Comment: heavy   Drug use: No   Sexual activity: Not on file  Other Topics Concern   Not on file  Social History Narrative   Not on file   Social Determinants of Health   Financial Resource Strain: Not on file  Food Insecurity:  Food Insecurity Present (10/07/2023)   Hunger Vital Sign    Worried About Running Out of Food in the Last Year: Sometimes true    Ran Out of Food in the Last Year: Never true  Transportation Needs: No Transportation Needs (10/07/2023)   PRAPARE - Administrator, Civil Service (Medical): No    Lack of Transportation (Non-Medical): No  Physical Activity: Not on file  Stress: Not on file  Social Connections: Unknown (08/13/2021)   Received from [facility]   Social Connections    Frequency of Communication with Friends and Family: Not asked    Frequency of Social Gatherings with Friends and Family: Not asked   SDOH: SDOH Screenings   Food Insecurity: Food Insecurity Present (10/07/2023)  Housing: Low Risk  (10/07/2023)  Transportation Needs: No Transportation Needs (10/07/2023)  Utilities: Not At Risk (10/07/2023)  Alcohol Screen: Medium Risk (04/08/2021)  Depression (PHQ2-9): Medium Risk (10/08/2023)  Social Connections: Unknown (08/13/2021)   Received from [facility]  Tobacco Use: High Risk (10/07/2023)   Additional Social History: Alcohol / Drug Use Pain Medications: Unnamed antidepressant. Prescriptions: Diabetes and High choloesteral medication. Over the Counter: None. History of alcohol / drug use?: Yes Longest period of sobriety (when/how long): 5 months. Negative Consequences of Use:  (None identified.) Withdrawal Symptoms: None Substance #1 Name of Substance 1: Alcohol. 1 - Age of First Use: 22. 1 - Amount (size/oz): Unknown amount. 1 - Frequency: Daily. 1 - Duration: 1 month. 1 - Last Use / Amount: Today. 1 - Method of Aquiring: Purchase. 1- Route of Use: Orally. Current Medications   Current Facility-Administered Medications  Medication Dose Route Frequency Provider Last Rate Last Admin   acetaminophen (TYLENOL) tablet 650 mg  650 mg Oral Q6H PRN Bobbitt, Shalon E, NP       alum & mag hydroxide-simeth (MAALOX/MYLANTA) 200-200-20 MG/5ML  suspension 30 mL  30 mL Oral Q4H PRN Bobbitt, Shalon E, NP       benzonatate (TESSALON) capsule 100 mg  100 mg Oral TID PRN Lamar Sprinkles, MD       hydrOXYzine (ATARAX) tablet 25 mg  25 mg Oral Q6H PRN Bobbitt, Shalon E, NP       loperamide (IMODIUM) capsule 2-4 mg  2-4 mg Oral PRN Bobbitt, Shalon E, NP       LORazepam (ATIVAN) tablet 1 mg  1 mg Oral Q6H PRN Bobbitt, Shalon E, NP       LORazepam (ATIVAN) tablet 1 mg  1 mg Oral QID Bobbitt, Shalon E, NP   1 mg at 10/08/23 2038   Followed by   LORazepam (ATIVAN) tablet 1 mg  1 mg Oral TID Bobbitt, Shalon E, NP       Followed by   Melene Muller ON 10/10/2023] LORazepam (ATIVAN) tablet 1 mg  1 mg Oral BID Bobbitt, Shalon E, NP       Followed by   Melene Muller ON 10/12/2023] LORazepam (ATIVAN) tablet 1 mg  1 mg Oral Daily Bobbitt, Shalon E, NP       magnesium hydroxide (MILK OF MAGNESIA) suspension 30 mL  30 mL Oral Daily PRN Bobbitt, Shalon E, NP       multivitamin with minerals tablet 1 tablet  1 tablet Oral Daily Bobbitt, Shalon E, NP   1 tablet at 10/08/23 1026   naltrexone (DEPADE) tablet 25 mg  25 mg Oral QHS Lamar Sprinkles, MD   25 mg at 10/08/23 2036   Followed by   Melene Muller ON 10/10/2023] naltrexone (DEPADE) tablet 50 mg  50 mg Oral QHS Lamar Sprinkles, MD       ondansetron (ZOFRAN-ODT) disintegrating tablet 4 mg  4 mg Oral Q6H PRN Bobbitt, Shalon E, NP       sertraline (ZOLOFT) tablet 50 mg  50 mg Oral Daily Cosby, Toni Amend, MD       thiamine (VITAMIN B1) injection 100 mg  100 mg Intramuscular Once Bobbitt, Shalon E, NP       thiamine (VITAMIN B1) tablet 100 mg  100 mg Oral Daily Bobbitt, Shalon E, NP       traZODone (DESYREL) tablet 50 mg  50 mg Oral QHS PRN Bobbitt, Shalon E, NP   50 mg at 10/08/23 2107   Current Outpatient Medications  Medication Sig Dispense Refill   atorvastatin (LIPITOR) 40 MG tablet Take 40 mg by mouth daily.     metFORMIN (GLUCOPHAGE-XR) 500 MG 24 hr tablet Take 1,000 mg by mouth daily with breakfast.     sertraline  (ZOLOFT) 50 MG tablet Take 50 mg by mouth daily.      Labs / Images  Lab Results:  Admission on 10/07/2023  Component Date Value Ref Range Status   TSH 10/07/2023 2.326  0.350 - 4.500 uIU/mL Final   Comment: Performed by a 3rd Generation assay with a functional sensitivity of <=0.01 uIU/mL. Performed at [facility], [address], [place], [place] [address]    Cholesterol 10/07/2023 172  0 - 200 mg/dL Final   Triglycerides 84/69/6295 268 (H)  <150 mg/dL Final   HDL 28/41/3244 37 (L)  >40 mg/dL Final   Total CHOL/HDL Ratio 10/07/2023 4.6  RATIO Final   VLDL 10/07/2023 54 (H)  0 - 40 mg/dL Final   LDL Cholesterol 10/07/2023 81  0 - 99 mg/dL Final   Comment:        Total Cholesterol/HDL:CHD Risk Coronary Heart Disease Risk Table                     Men   Women  1/2 Average Risk   3.4   3.3  Average Risk       5.0   4.4  2 X Average Risk   9.6   7.1  3 X Average Risk  23.4   11.0        Use the calculated Patient Ratio above and the CHD Risk Table to determine the patient's CHD Risk.        ATP [name] CLASSIFICATION (LDL):  <100     mg/dL   Optimal  010-272  mg/dL   Near or Above                    Optimal  130-159  mg/dL   Borderline  536-644  mg/dL   High  >034     mg/dL   Very High Performed at [facility], [address], [place], [place] [address]  Admission on 10/07/2023, Discharged on 10/07/2023  Component Date Value Ref Range Status   Sodium 10/07/2023 136  135 - 145 mmol/L Final   Potassium 10/07/2023 3.2 (L)  3.5 - 5.1 mmol/L Final   Chloride 10/07/2023 97 (L)  98 - 111 mmol/L Final   CO2 10/07/2023 23  22 - 32 mmol/L Final   Glucose, Bld 10/07/2023 141 (H)  70 - 99 mg/dL Final   Glucose reference range applies only to samples taken after fasting for at least 8 hours.   BUN 10/07/2023 16  6 - 20 mg/dL Final   Creatinine, Ser 10/07/2023 0.77  0.61 - 1.24 mg/dL Final   Calcium 78/46/9629 8.9  8.9 - 10.3 mg/dL Final   Total Protein 52/84/1324 8.5 (H)   6.5 - 8.1 g/dL Final   Albumin 40/08/2724 4.0  3.5 - 5.0 g/dL Final   AST 36/64/4034 29  15 - 41 U/L Final   ALT 10/07/2023 35  0 - 44 U/L Final   Alkaline Phosphatase 10/07/2023 89  38 - 126 U/L Final   Total Bilirubin 10/07/2023 0.5  <1.2 mg/dL Final   GFR, Estimated 10/07/2023 >60  >60 mL/min Final   Comment: (NOTE) Calculated using the CKD-EPI Creatinine Equation (2021)    Anion gap 10/07/2023 16 (H)  5 - 15 Final   Performed at [facility], [address] [place], [place] [address]   Alcohol, Ethyl (B) 10/07/2023 60 (H)  <10 mg/dL Final   Comment: (NOTE) Lowest detectable limit for serum alcohol is 10 mg/dL.  For medical purposes only. Performed at [facility], [address], [place], [place] [address]    Opiates 10/07/2023 NONE DETECTED  NONE DETECTED Final   Cocaine 10/07/2023 NONE DETECTED  NONE DETECTED Final   Benzodiazepines 10/07/2023 POSITIVE (A)  NONE DETECTED Final   Amphetamines 10/07/2023 NONE DETECTED  NONE DETECTED Final   Tetrahydrocannabinol 10/07/2023 NONE DETECTED  NONE DETECTED Final   Barbiturates 10/07/2023 NONE DETECTED  NONE DETECTED Final   Comment: (NOTE) DRUG SCREEN FOR MEDICAL PURPOSES ONLY.  IF CONFIRMATION IS NEEDED FOR ANY PURPOSE, NOTIFY LAB WITHIN 5 DAYS.  LOWEST DETECTABLE LIMITS FOR URINE DRUG SCREEN Drug Class                     Cutoff (ng/mL) Amphetamine and metabolites    1000 Barbiturate and metabolites    200 Benzodiazepine                 200 Opiates and metabolites        300 Cocaine and metabolites        300 THC                            50 Performed at [facility], [address] [place], [place] [address]    WBC 10/07/2023 9.8  4.0 - 10.5 K/uL Final   RBC 10/07/2023 4.90  4.22 - 5.81 MIL/uL Final   Hemoglobin 10/07/2023 14.4  13.0 - 17.0 g/dL Final   HCT 29/51/8841 43.1  39.0 - 52.0 % Final   MCV 10/07/2023 88.0  80.0 - 100.0 fL Final   MCH 10/07/2023  29.4  26.0 - 34.0 pg Final   MCHC 10/07/2023 33.4  30.0 - 36.0 g/dL Final   RDW 66/04/3015 15.1  11.5 - 15.5 % Final   Platelets 10/07/2023 414 (H)  150 - 400 K/uL Final  nRBC 10/07/2023 0.0  0.0 - 0.2 % Final   Neutrophils Relative % 10/07/2023 65  % Final   Neutro Abs 10/07/2023 6.3  1.7 - 7.7 K/uL Final   Lymphocytes Relative 10/07/2023 27  % Final   Lymphs Abs 10/07/2023 2.7  0.7 - 4.0 K/uL Final   Monocytes Relative 10/07/2023 6  % Final   Monocytes Absolute 10/07/2023 0.6  0.1 - 1.0 K/uL Final   Eosinophils Relative 10/07/2023 2  % Final   Eosinophils Absolute 10/07/2023 0.2  0.0 - 0.5 K/uL Final   Basophils Relative 10/07/2023 0  % Final   Basophils Absolute 10/07/2023 0.0  0.0 - 0.1 K/uL Final   Immature Granulocytes 10/07/2023 0  % Final   Abs Immature Granulocytes 10/07/2023 0.03  0.00 - 0.07 K/uL Final   Performed at [facility], [address], [place], [place] [address]   Acetaminophen (Tylenol), Serum 10/07/2023 <10 (L)  10 - 30 ug/mL Final   Comment: (NOTE) Therapeutic concentrations vary significantly. A range of 10-30 ug/mL  may be an effective concentration for many patients. However, some  are best treated at concentrations outside of this range. Acetaminophen concentrations >150 ug/mL at 4 hours after ingestion  and >50 ug/mL at 12 hours after ingestion are often associated with  toxic reactions.  Performed at [facility], [address], [place], [place] [address]    Salicylate Lvl 10/07/2023 <7.0 (L)  7.0 - 30.0 mg/dL Final   Performed at [facility], [address] [place], [place] [address]   Blood Alcohol level:  Lab Results  Component Value Date   ETH 60 (H) 10/07/2023   ETH <10 05/10/2021   Metabolic Disorder Labs: No results found for: "HGBA1C", "MPG" No results found for: "PROLACTIN" Lab Results  Component Value Date   CHOL 172 10/07/2023   TRIG 268 (H) 10/07/2023   HDL 37 (L)  10/07/2023   CHOLHDL 4.6 10/07/2023   VLDL 54 (H) 10/07/2023   LDLCALC 81 10/07/2023   Therapeutic Lab Levels: No results found for: "LITHIUM" No results found for: "VALPROATE" No results found for: "CBMZ" Physical Findings   AIMS    Flowsheet Row [facility]) [facility] Ad[facility]) [facility]  AIMS Total Score 0 0      AUDIT    Flowsheet Row [facility]) [facility] Ad[facility] ([facility]) [facility]  Alcohol Use Disorder Identification Test Final Score (AUDIT) 30 27      PHQ2-9    Flowsheet Row ED from 10/07/2023 in [facility]  PHQ-2 Total Score 2  PHQ-9 Total Score 7      Flowsheet Row ED from 10/07/2023 in [facility] Most recent reading at 10/07/2023 11:49 PM ED from 10/07/2023 in [facility] at [facility] Most recent reading at 10/07/2023  3:58 PM ED from 07/07/2021 in [facility] at [facility] Most recent reading at 07/07/2021  4:06 PM  C-SSRS RISK CATEGORY High Risk High Risk No Risk       Musculoskeletal  Strength & Muscle Tone: within normal limits Gait & Station: normal Patient leans: N/A   Strength & Muscle Tone: within normal limits Gait & Station: normal Patient leans: N/A  Psychiatric Specialty Exam   Presentation  General Appearance:Appropriate  for Environment Eye Contact:Good Speech:Clear and Coherent Volume:Normal Handedness:   Mood and Affect  Mood:Depressed Affect:Appropriate  Thought Process  Thought Process:Coherent Descriptions of Associations:Intact  Thought Content Suicidal Thoughts:Suicidal Thoughts: No Homicidal Thoughts:Homicidal Thoughts:  No Hallucinations:Hallucinations: None Ideas of Reference:None Thought Content:   Sensorium  Memory:  Judgment:Good Insight:Good  Executive Functions  Orientation:Full (Time, Place and Person) Language:Good Concentration:Good Attention:Good Recall:Good Fund of Knowledge:   Psychomotor Activity  Psychomotor Activity:   Assets  Assets:   Sleep  Quality:Good  Physical Exam and Labs  BP 123/76 (BP Location: Right Arm)   Pulse 65   Temp 98.1 F (36.7 C)   Resp 18   SpO2 99%  Physical Exam Vitals reviewed.  Constitutional:      General: He is not in acute distress. HENT:     Head: Normocephalic and atraumatic.  Pulmonary:     Effort: Pulmonary effort is normal.  Skin:    General: Skin is warm and dry.  Neurological:     General: No focal deficit present.     Mental Status: He is alert and oriented to person, place, and time.     Motor: No weakness.     Gait: Gait normal.       EKG: normal sinus rhythm, nonspecific T waves changes, QTc 435.   Assessment / Plan  Total Time spent with patient: 1 hour Treatment Plan Summary: Daily contact with patient to assess and evaluate symptoms and progress in treatment and Medication management  Principal Problem:   Alcohol dependence (HCC)   Tarrel Swarm [name] is a 47 y.o. male with  history of alcohol use disorder with recent relapse after 8 months leading to alcohol-induced depression and anxiety. Patient motivated to achieve sobriety and would like to pursue residential treatment at this time. He would like for mood symptoms to be addressed and is advised that alcohol cessation would improve his symptoms significantly, but in the interim, may aid in depressive symptoms. Zoloft and Naltrexone r/b/ae discussed with patient, and he is agreeable to a trial. Patient denies hx of opiate use, UDS pos for BZD (received in ED), and LFTs WNL. Will proceed with Naltrexone at this time. LCSW to assist with disposition planning  upon return tomorrow.   #Alcohol Use Disorder #Alcohol-Induced Depression and Anxiety CIWA Ativan protocol initiated: - lorazepam (Ativan) 1 mg 4 times daily x4 doses, 1 mg 3 times daily x3 doses, 1 mg 2 times daily x2 doses, 1 mg daily x1 dose -lorazepam (Ativan) 1 mg every 6 hours as needed for CIWA greater than 10; Hydroxyzine 25 mg for CIWA less than 10 -Multivitamin with minerals 1 tablet daily -Ondansetron disintegrating tablet 4 mg every 6 as needed/nausea or vomiting -Loperamide 2 to 4 mg oral as needed/diarrhea or loose stools -Thiamine injection 100 mg IM once followed by Thiamine tablet 100 mg daily. -- START Zoloft 50 mg tomorrow for depressive and anxiety sx -- START Naltrexone 25 mg at bedtime x 2 days, then increase to 50 mg at bedtime for alcohol use disorder.   #Hist of HTN --24 hour BP 110-122/75-83; MR 120/80. Patient reports home medication Lisinopril (unsure of medication and dosage).  -As patient normotensive, will refrain from starting at this time.   #Hist of T2DM --A1c pending. Patient reports home medication Metformin 1000 mg daily. Will decide whether to restart when A1c results   #Hypertriglyceridemia -- TG 268 this admission.  Likely elevated in the setting of chronic alcohol use, obesity, and other factors.  --  Primary team recommended to advise PCP follow-up.   Considerations for follow-up: Recommend monitoring LFT every 6-12 months while on naltrexone  DISPO:  amenable to residential rehab. Will attempt to dc patient door-to-door, however if unable will discuss with CD-IOP as bridge to rehab.  Tentative date: TBA Location: TBA   Signed: Harlin Heys, DO 10/09/2023, 8:29 AM   [facility] [address] [place], [place] [address] Dept: [phone] Dept Fax: [phone]

## 2023-10-09 NOTE — ED Notes (Signed)
Patient in hallway, sitting calmly. No acute distress noted. No concerns voiced. Informed patient to notify staff with any needs or assistance. Patient verbalized understanding or agreement. Safety checks in place per facility policy.

## 2023-10-09 NOTE — Care Management (Signed)
[facility] Care Management   Patient is under review for placement at [facility]

## 2023-10-09 NOTE — Group Note (Signed)
Group Topic: Change and Accountability (AA Meeting Group) Group Date: 10/09/2023 Start Time: 1000 End Time: 1110 Facilitators: Vonzell Schlatter B  Department: [facility]  Number of Participants: 4  Group Focus: activities of daily living skills and daily focus Treatment Modality:  Psychoeducation Interventions utilized were problem solving and support Purpose: express feelings and regain self-worth  Name: Aaron [name] Date of Birth: 25-Jul-1976  MR: [id number]    Level of Participation: active Quality of Participation: attentive and cooperative Interactions with others: gave feedback Mood/Affect: positive Triggers (if applicable): n/a Cognition: coherent/clear Progress: Moderate Response: express help with 30 day treatment plan and he is willing to stay as long as possible to get the help he needs Plan: follow-up needed  Patients Problems:  Patient Active Problem List   Diagnosis Date Noted   Alcohol abuse    Suicidal ideation    Alcohol withdrawal without perceptual disturbances (HCC) 04/08/2021   Alcohol dependence (HCC) 04/08/2021   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

## 2023-10-09 NOTE — ED Notes (Signed)
Patient has been resting comfortably this shift, no c/o pain, no s/s of distress. Respirations even and non-labored.

## 2023-10-09 NOTE — ED Notes (Signed)
 Patient alert & oriented x4. Denies intent to harm self or others when asked. Denies A/VH. Patient denies any physical complaints when asked. No acute distress noted. Support and encouragement provided. Routine safety checks conducted per facility protocol. Encouraged patient to notify staff if any thoughts of harm towards self or others arise. Patient verbalizes understanding and agreement.

## 2023-10-09 NOTE — ED Notes (Signed)
Patient in the bedroom awake, calm and composed. "Does not need anything" he stated.  NAD.Respirations are even and unlabored. Will keep monitoring for safety.

## 2023-10-09 NOTE — ED Notes (Signed)
Patient in the bedroom calm and sleeping.   NAD.Respirations are even and unlabored. Will keep monitoring for safety.

## 2023-10-10 DIAGNOSIS — F1090 Alcohol use, unspecified, uncomplicated: Secondary | ICD-10-CM | POA: Diagnosis not present

## 2023-10-10 LAB — HEMOGLOBIN A1C
Hgb A1c MFr Bld: 5.9 % — ABNORMAL HIGH (ref 4.8–5.6)
Mean Plasma Glucose: 122.63 mg/dL

## 2023-10-10 NOTE — Care Management (Addendum)
[facility] Care Management   Declined  [facility] per [name] because the patient has [name]) insurance and they are not in network to accept this insurance.   [facility] - He does not have a valid [name] and they do not accept his insurance, per intake worker.   [facility] - They do not accept his insurance, per intake worker   [facility], per [place] [name] at [facility] [phone]) the patient was declined because they do not accept his insurance provider [name]).  In addition, he does not have identification for [facility].  The patient only has identification for the [employer] [facility] [place].     [facility] at [facility], per intake worker they do not accept his insurance provider.   6pm  Patient is under review at [facility].  Per [place] they are verifying his insurance.

## 2023-10-10 NOTE — ED Notes (Signed)
Specimen collected without complication. [facility] called for pickup.

## 2023-10-10 NOTE — ED Notes (Signed)
Patient sitting in dayroom interacting with peers. No acute distress noted. No concerns voiced. Informed patient to notify staff with any needs or assistance. Patient verbalized understanding or agreement. Safety checks in place per facility policy.

## 2023-10-10 NOTE — Group Note (Signed)
Group Topic: Communication  Group Date: 10/10/2023 Start Time: 1930 End Time: 2000 Facilitators: Rae Lips B  Department: [facility]  Number of Participants: 1  Group Focus: check in Treatment Modality:  Individual Therapy and Patient-Centered Therapy Interventions utilized were support Purpose: express feelings  Name: Aaron [name] Date of Birth: 1976-07-01  MR: [id number]    Level of Participation: active Quality of Participation: attentive and cooperative Interactions with others: gave feedback Mood/Affect: appropriate Triggers (if applicable): NA Cognition: coherent/clear Progress: Minimal Response: NA Plan: patient will be encouraged to go to groups.   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

## 2023-10-10 NOTE — Group Note (Signed)
Group Topic: Wellness  Group Date: 10/10/2023 Start Time: 1000 End Time: 1030 Facilitators: Londell Moh, NT  Department: [facility]  Number of Participants: 3  Group Focus: check in and leisure skills Treatment Modality:  Psychoeducation Interventions utilized were leisure development and patient education Purpose: increase insight  Name: Aaron [name] Date of Birth: 12-15-1975  MR: [id number]    Level of Participation: PT DID NOT ATTEND GROUP Patients Problems:  Patient Active Problem List   Diagnosis Date Noted   Alcohol abuse    Suicidal ideation    Alcohol withdrawal without perceptual disturbances (HCC) 04/08/2021   Alcohol dependence (HCC) 04/08/2021   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

## 2023-10-10 NOTE — ED Notes (Signed)
Pt was provided dinner.

## 2023-10-10 NOTE — ED Notes (Signed)
Pt was provided breakfast.

## 2023-10-10 NOTE — ED Notes (Signed)
Patient A&Ox4. Denies intent to harm self/others when asked. Denies A/VH. Patient denies any physical complaints when asked. No acute distress noted. Pt reports, "I slept pretty good last night". Routine safety checks conducted according to facility protocol. Encouraged patient to notify staff if thoughts of harm toward self or others arise. Patient verbalize understanding and agreement. Will continue to monitor for safety.

## 2023-10-10 NOTE — ED Provider Notes (Signed)
[name] Progress Note  Date & Time: 10/10/2023 11:42 AM Name: Aaron [name] Age: 47 y.o.  DOB: 12-07-1975  MRN: [id number]  Diagnosis:  Final diagnoses:  Alcohol use disorder  Alcohol-induced depressive disorder with moderate or severe use disorder with onset during intoxication [name])    Reason for presentation: Anxiety, Stress, and Addiction Problem  Brief HPI  [name] is a 47 y.o. male, with PMH alcohol use d/o (no h/o sz or DT), SIMD (alcohol), housing instability, past IP psych admission (08/2023 in [name] [name]), who presented Voluntary to [name] (10/10/2023) as a transfer from [name] then admitted to [name] for alcohol detox and treatment for depression. He is interested in residential rehab.      Component Value Date/Time   ETH 60 (H) 10/07/2023 1701       Component Value Date/Time   LABOPIA NONE DETECTED 10/07/2023 1705   COCAINSCRNUR NONE DETECTED 10/07/2023 1705   LABBENZ POSITIVE (A) 10/07/2023 1705   AMPHETMU NONE DETECTED 10/07/2023 1705   THCU NONE DETECTED 10/07/2023 1705   LABBARB NONE DETECTED 10/07/2023 1705     Interval Hx   Patient Narrative:  On assessment today, patient reports that his mood is better than it has been. He reports anxiety related to where he will go for rehab. He reports improved sleep, no changes in his appetite. He reports goal is to try to go to rehab. He denies any current withdrawal symptoms and denies past history of withdrawal symptoms. He reports that he has only been in [name] since last Sunday. He denies side effects from current medications. Discussed that we will d/c ativan taper given no reported withdrawal sx, CIWA 0 over the past 24 hours, but will continue PRN ativan. He denies SI/HI/AVH.   Review of Systems  Constitutional:  Negative for chills, diaphoresis, fever and malaise/fatigue.  Respiratory:  Negative for shortness of breath.    Cardiovascular:  Negative for chest pain.  Gastrointestinal:  Negative for abdominal pain, constipation, diarrhea and vomiting.  Musculoskeletal:        Chronic  Neurological:  Negative for dizziness, tremors, seizures, weakness and headaches.     Past History   Psychiatric History:  Dx: Depression, alcohol use disorder Prior Rx: Maybe sertraline OP psychiatrist: Denies OP therapist: Denies PCP: Patient, No Pcp Per  Suicide Attempt: Denies Inpatient psych: Yes, 08/2023 in [place] [name], [name]  Psychiatric Family History:  Unsure  Social History:   Living: In hotel since returning to [name] on Sunday night Income: None currently Trauma: Denies Social support: Mother and other family members  EtOH:  reports current alcohol use.  Tobacco:  reports that he has been smoking cigarettes. He has never used smokeless tobacco.  Cannabis: Denies Opiates: Denies Stimulants: Denies BZO/hypnotics: Denies Seizure/DT: Denies  Treatments: Denies, but chart shows [facility] [place] IVDU: Denies  Past Medical History:  Past Medical History:  Diagnosis Date   Depression    Diabetes mellitus without complication ([name])    Hypertension    No past surgical history on file. Family History:  Family History  Problem Relation Age of Onset   Cancer Father    Social History   Substance and Sexual Activity  Alcohol Use Yes   Comment: heavy    Social History   Substance and Sexual Activity  Drug Use No    Social History   Socioeconomic History   Marital status: Single    Spouse name: Not on  file   Number of children: Not on file   Years of education: Not on file   Highest education level: Not on file  Occupational History   Not on file  Tobacco Use   Smoking status: Every Day    Current packs/day: 0.25    Types: Cigarettes   Smokeless tobacco: Never  Vaping Use   Vaping status: Never Used  Substance and Sexual Activity   Alcohol use: Yes    Comment: heavy   Drug use: No    Sexual activity: Not on file  Other Topics Concern   Not on file  Social History Narrative   Not on file   Social Determinants of Health   Financial Resource Strain: Not on file  Food Insecurity: Food Insecurity Present (10/07/2023)   [name]    Worried About Running Out of Food in the Last Year: Sometimes true    Ran Out of Food in the Last Year: Never true  Transportation Needs: No Transportation Needs (10/07/2023)   PRAPARE - Administrator, Civil Service (Medical): No    Lack of Transportation (Non-Medical): No  Physical Activity: Not on file  Stress: Not on file  Social Connections: Unknown (08/13/2021)   Received from [name]   Social Connections    Frequency of Communication with Friends and Family: Not asked    Frequency of Social Gatherings with Friends and Family: Not asked   SDOH: SDOH Screenings   Food Insecurity: Food Insecurity Present (10/07/2023)  Housing: Low Risk  (10/07/2023)  Transportation Needs: No Transportation Needs (10/07/2023)  Utilities: Not At Risk (10/07/2023)  Alcohol Screen: Medium Risk (04/08/2021)  Depression (PHQ2-9): Medium Risk (10/08/2023)  Social Connections: Unknown (08/13/2021)   Received from [name]  Tobacco Use: High Risk (10/07/2023)   Additional Social History: Alcohol / Drug Use Pain Medications: Unnamed antidepressant. Prescriptions: Diabetes and High choloesteral medication. Over the Counter: None. History of alcohol / drug use?: Yes Longest period of sobriety (when/how long): 5 months. Negative Consequences of Use:  (None identified.) Withdrawal Symptoms: None Substance #1 Name of Substance 1: Alcohol. 1 - Age of First Use: 22. 1 - Amount (size/oz): Unknown amount. 1 - Frequency: Daily. 1 - Duration: 1 month. 1 - Last Use / Amount: Today. 1 - Method of Aquiring: Purchase. 1- Route of Use: Orally. Current Medications   Current Facility-Administered Medications  Medication Dose  Route Frequency Provider Last Rate Last Admin   acetaminophen (TYLENOL) tablet 650 mg  650 mg Oral Q6H PRN Bobbitt, Shalon E, NP       alum & mag hydroxide-simeth (MAALOX/MYLANTA) 200-200-20 MG/5ML suspension 30 mL  30 mL Oral Q4H PRN Bobbitt, Shalon E, NP       benzonatate (TESSALON) capsule 100 mg  100 mg Oral TID PRN Lamar Sprinkles, MD   100 mg at 10/09/23 1731   hydrOXYzine (ATARAX) tablet 25 mg  25 mg Oral Q6H PRN Bobbitt, Shalon E, NP   25 mg at 10/09/23 2108   loperamide (IMODIUM) capsule 2-4 mg  2-4 mg Oral PRN Bobbitt, Shalon E, NP       LORazepam (ATIVAN) tablet 1 mg  1 mg Oral Q6H PRN Bobbitt, Shalon E, NP   1 mg at 10/09/23 2107   magnesium hydroxide (MILK OF MAGNESIA) suspension 30 mL  30 mL Oral Daily PRN Bobbitt, Shalon E, NP       multivitamin with minerals tablet 1 tablet  1 tablet Oral Daily Bobbitt, Franchot Mimes, NP  1 tablet at 10/10/23 0938   naltrexone (DEPADE) tablet 50 mg  50 mg Oral QHS Lamar Sprinkles, MD       ondansetron (ZOFRAN-ODT) disintegrating tablet 4 mg  4 mg Oral Q6H PRN Bobbitt, Shalon E, NP       sertraline (ZOLOFT) tablet 50 mg  50 mg Oral Daily Lamar Sprinkles, MD   50 mg at 10/10/23 6644   thiamine (VITAMIN B1) injection 100 mg  100 mg Intramuscular Once Bobbitt, Shalon E, NP       thiamine (VITAMIN B1) tablet 100 mg  100 mg Oral Daily Bobbitt, Shalon E, NP   100 mg at 10/10/23 0938   traZODone (DESYREL) tablet 50 mg  50 mg Oral QHS PRN Bobbitt, Shalon E, NP   50 mg at 10/09/23 2108   Current Outpatient Medications  Medication Sig Dispense Refill   atorvastatin (LIPITOR) 40 MG tablet Take 40 mg by mouth daily.     metFORMIN (GLUCOPHAGE-XR) 500 MG 24 hr tablet Take 1,000 mg by mouth daily with breakfast.     sertraline (ZOLOFT) 50 MG tablet Take 50 mg by mouth daily.      Labs / Images  Lab Results:  Admission on 10/07/2023  Component Date Value Ref Range Status   TSH 10/07/2023 2.326  0.350 - 4.500 uIU/mL Final   Comment: Performed by a 3rd  Generation assay with a functional sensitivity of <=0.01 uIU/mL. Performed at [name], [address] [place], [name] [address]    Cholesterol 10/07/2023 172  0 - 200 mg/dL Final   Triglycerides 25/95/6387 268 (H)  <150 mg/dL Final   HDL 56/43/3295 37 (L)  >40 mg/dL Final   Total CHOL/HDL Ratio 10/07/2023 4.6  RATIO Final   VLDL 10/07/2023 54 (H)  0 - 40 mg/dL Final   LDL Cholesterol 10/07/2023 81  0 - 99 mg/dL Final   Comment:        Total Cholesterol/HDL:CHD Risk Coronary Heart Disease Risk Table                     Men   Women  1/2 Average Risk   3.4   3.3  Average Risk       5.0   4.4  2 X Average Risk   9.6   7.1  3 X Average Risk  23.4   11.0        Use the calculated Patient Ratio above and the CHD Risk Table to determine the patient's CHD Risk.        [name] CLASSIFICATION (LDL):  <100     mg/dL   Optimal  188-416  mg/dL   Near or Above                    Optimal  130-159  mg/dL   Borderline  606-301  mg/dL   High  >601     mg/dL   Very High Performed at [name], [address], [place], [name] [address]   Admission on 10/07/2023, Discharged on 10/07/2023  Component Date Value Ref Range Status   Sodium 10/07/2023 136  135 - 145 mmol/L Final   Potassium 10/07/2023 3.2 (L)  3.5 - 5.1 mmol/L Final   Chloride 10/07/2023 97 (L)  98 - 111 mmol/L Final   CO2 10/07/2023 23  22 - 32 mmol/L Final   Glucose, Bld 10/07/2023 141 (H)  70 - 99 mg/dL Final   Glucose reference range  applies only to samples taken after fasting for at least 8 hours.   BUN 10/07/2023 16  6 - 20 mg/dL Final   Creatinine, Ser 10/07/2023 0.77  0.61 - 1.24 mg/dL Final   Calcium 78/29/5621 8.9  8.9 - 10.3 mg/dL Final   Total Protein 30/86/5784 8.5 (H)  6.5 - 8.1 g/dL Final   Albumin 69/62/9528 4.0  3.5 - 5.0 g/dL Final   AST 41/32/4401 29  15 - 41 U/L Final   ALT 10/07/2023 35  0 - 44 U/L Final   Alkaline Phosphatase 10/07/2023 89  38 - 126 U/L Final   Total Bilirubin  10/07/2023 0.5  <1.2 mg/dL Final   GFR, Estimated 10/07/2023 >60  >60 mL/min Final   Comment: (NOTE) Calculated using the CKD-EPI Creatinine Equation (2021)    Anion gap 10/07/2023 16 (H)  5 - 15 Final   Performed at [name], [address] [place], [name] [address]   Alcohol, Ethyl (B) 10/07/2023 60 (H)  <10 mg/dL Final   Comment: (NOTE) Lowest detectable limit for serum alcohol is 10 mg/dL.  For medical purposes only. Performed at [name], [address], [place], [name] [address]    Opiates 10/07/2023 NONE DETECTED  NONE DETECTED Final   Cocaine 10/07/2023 NONE DETECTED  NONE DETECTED Final   Benzodiazepines 10/07/2023 POSITIVE (A)  NONE DETECTED Final   Amphetamines 10/07/2023 NONE DETECTED  NONE DETECTED Final   Tetrahydrocannabinol 10/07/2023 NONE DETECTED  NONE DETECTED Final   Barbiturates 10/07/2023 NONE DETECTED  NONE DETECTED Final   Comment: (NOTE) DRUG SCREEN FOR MEDICAL PURPOSES ONLY.  IF CONFIRMATION IS NEEDED FOR ANY PURPOSE, NOTIFY LAB WITHIN 5 DAYS.  LOWEST DETECTABLE LIMITS FOR URINE DRUG SCREEN Drug Class                     Cutoff (ng/mL) Amphetamine and metabolites    1000 Barbiturate and metabolites    200 Benzodiazepine                 200 Opiates and metabolites        300 Cocaine and metabolites        300 THC                            50 Performed at [name], [address], [place], [name] [address]    WBC 10/07/2023 9.8  4.0 - 10.5 K/uL Final   RBC 10/07/2023 4.90  4.22 - 5.81 MIL/uL Final   Hemoglobin 10/07/2023 14.4  13.0 - 17.0 g/dL Final   HCT 25/95/6387 43.1  39.0 - 52.0 % Final   MCV 10/07/2023 88.0  80.0 - 100.0 fL Final   MCH 10/07/2023 29.4  26.0 - 34.0 pg Final   MCHC 10/07/2023 33.4  30.0 - 36.0 g/dL Final   RDW 56/43/3295 15.1  11.5 - 15.5 % Final   Platelets 10/07/2023 414 (H)  150 - 400 K/uL Final   nRBC 10/07/2023 0.0  0.0 - 0.2 % Final    Neutrophils Relative % 10/07/2023 65  % Final   Neutro Abs 10/07/2023 6.3  1.7 - 7.7 K/uL Final   Lymphocytes Relative 10/07/2023 27  % Final   Lymphs Abs 10/07/2023 2.7  0.7 - 4.0 K/uL Final   Monocytes Relative 10/07/2023 6  % Final   Monocytes Absolute 10/07/2023 0.6  0.1 - 1.0 K/uL Final   Eosinophils Relative  10/07/2023 2  % Final   Eosinophils Absolute 10/07/2023 0.2  0.0 - 0.5 K/uL Final   Basophils Relative 10/07/2023 0  % Final   Basophils Absolute 10/07/2023 0.0  0.0 - 0.1 K/uL Final   Immature Granulocytes 10/07/2023 0  % Final   Abs Immature Granulocytes 10/07/2023 0.03  0.00 - 0.07 K/uL Final   Performed at [facility], [address], [place], [name] [address]   Acetaminophen (Tylenol), Serum 10/07/2023 <10 (L)  10 - 30 ug/mL Final   Comment: (NOTE) Therapeutic concentrations vary significantly. A range of 10-30 ug/mL  may be an effective concentration for many patients. However, some  are best treated at concentrations outside of this range. Acetaminophen concentrations >150 ug/mL at 4 hours after ingestion  and >50 ug/mL at 12 hours after ingestion are often associated with  toxic reactions.  Performed at [facility], [address] [place] [address], [place], [name] [address]    Salicylate Lvl 10/07/2023 <7.0 (L)  7.0 - 30.0 mg/dL Final   Performed at [name], [address], [place], [name] [address]   Blood Alcohol level:  Lab Results  Component Value Date   ETH 60 (H) 10/07/2023   ETH <10 05/10/2021   Metabolic Disorder Labs: No results found for: "HGBA1C", "MPG" No results found for: "PROLACTIN" Lab Results  Component Value Date   CHOL 172 10/07/2023   TRIG 268 (H) 10/07/2023   HDL 37 (L) 10/07/2023   CHOLHDL 4.6 10/07/2023   VLDL 54 (H) 10/07/2023   LDLCALC 81 10/07/2023   Therapeutic Lab Levels: No results found for: "LITHIUM" No results found for: "VALPROATE" No results found for:  "CBMZ" Physical Findings   AIMS    Flowsheet Row [facility]) [name] Ad[facility]) [name]  AIMS Total Score 0 0      AUDIT    Flowsheet Row Ad[facility]) [name] Ad[facility] (Discharged) [name]  Alcohol Use Disorder Identification Test Final Score (AUDIT) 30 27      PHQ2-9    Flowsheet Row [name] from 10/07/2023 in [place] [name]  PHQ-2 Total Score 3  PHQ-9 Total Score 10      Flowsheet Row [name] from 10/07/2023 in [place] [name] Most recent reading at 10/07/2023 11:49 PM [name] from 10/07/2023 in [name] Most recent reading at 10/07/2023  3:58 PM [name] from 07/07/2021 in [name] Most recent reading at 07/07/2021  4:06 PM  C-SSRS RISK CATEGORY High Risk High Risk No Risk       Musculoskeletal  Strength & Muscle Tone: within normal limits Gait & Station: normal Patient leans: N/A   Strength & Muscle Tone: within normal limits Gait & Station: normal Patient leans: N/A  Psychiatric Specialty Exam   Presentation  General Appearance:Appropriate for Environment Eye Contact:Good Speech:Clear and Coherent Volume:Normal Handedness:Right  Mood and Affect  Mood:Anxious Affect:Congruent  Thought Process  Thought Process:Coherent Descriptions of Associations:Intact  Thought Content Suicidal Thoughts:Suicidal Thoughts: No Homicidal Thoughts:Homicidal Thoughts: No Hallucinations:Hallucinations: None Ideas of Reference:None Thought Content:Logical  Sensorium  Memory:Immediate Good, Recent Good, Remote Good Judgment:Intact Insight:Shallow  Executive Functions  Orientation:Full  (Time, Place and Person) Language:Fair Concentration:Fair Attention:Fair Recall:Fair Fund of Knowledge:Fair  Psychomotor Activity  Psychomotor Activity:Psychomotor  Activity: Normal  Assets  Assets:Communication Skills, Desire for Improvement, Social Support  Sleep  Quality:Fair  Physical Exam and Labs  BP 108/87   Pulse 73   Temp 98.1 F (36.7 C) (Oral)   Resp 18   SpO2 96%  Physical Exam Vitals reviewed.  Constitutional:      General: He is not in acute distress. HENT:     Head: Normocephalic and atraumatic.  Pulmonary:     Effort: Pulmonary effort is normal.  Skin:    General: Skin is warm and dry.  Neurological:     General: No focal deficit present.     Mental Status: He is alert and oriented to person, place, and time.     Motor: No weakness.     Gait: Gait normal.      EKG: normal sinus rhythm, nonspecific T waves changes, QTc 435.   Assessment / Plan  Total Time spent with patient: 1 hour Treatment Plan Summary: Daily contact with patient to assess and evaluate symptoms and progress in treatment and Medication management  Principal Problem:   Alcohol dependence ([place] [name])  [name] is a 47 y.o. male with  history of alcohol use disorder with recent relapse after 8 months leading to alcohol-induced depression and anxiety. Patient motivated to achieve sobriety and would like to pursue residential treatment at this time. He would like for mood symptoms to be addressed and is advised that alcohol cessation would improve his symptoms significantly, but in the interim, may aid in depressive symptoms. Zoloft and Naltrexone r/b/ae discussed with patient, and he is agreeable to a trial. Patient denies hx of opiate use, UDS pos for BZD (received in [name]), and LFTs WNL. Will proceed with Naltrexone at this time. He would like to go to inpatient rehab however we discussed limitations of this given his insurance is in [place].   #Alcohol Use  Disorder #Alcohol-Induced Depression and Anxiety CIWA Ativan protocol initiated: -d/c scheduled ativan taper, last drink 11/16. Continue CIWA with PRN ativan.  -lorazepam (Ativan) 1 mg every 6 hours as needed for CIWA greater than 10; Hydroxyzine 25 mg for CIWA less than 10 -Multivitamin with minerals 1 tablet daily -Ondansetron disintegrating tablet 4 mg every 6 as needed/nausea or vomiting -Loperamide 2 to 4 mg oral as needed/diarrhea or loose stools -Thiamine injection 100 mg IM once followed by Thiamine tablet 100 mg daily. -- Continue Zoloft 50 mg for depressive and anxiety sx -- Continue Naltrexone 50 mg at bedtime for alcohol use disorder.   #Hist of HTN --24 hour BP 110-122/75-83; MR 120/80. Patient reports home medication Lisinopril (unsure of medication and dosage).  -As patient normotensive, will refrain from starting at this time.   #Hist of T2DM --A1c pending. Patient reports home medication Metformin 1000 mg daily. Will decide whether to restart when A1c results   #Hypertriglyceridemia -- TG 268 this admission.  Likely elevated in the setting of chronic alcohol use, obesity, and other factors.  -- Outpatient PCP follow-up.  Considerations for follow-up: Recommend monitoring LFT every 6-12 months while on naltrexone  DISPO:  amenable to residential rehab. Will attempt to dc patient door-to-door, however if unable will discuss with [facility] as bridge to rehab. Patient insurance may be a barrier Tentative date: TBA Location: TBA   Signed: Karie Fetch, MD, PGY-2 10/10/2023, 11:42 AM   [name] [address] [place], [name] [address] Dept: [phone] Dept Fax: [phone]

## 2023-10-10 NOTE — ED Notes (Signed)
Pt sleeping in no acute distress. RR even and unlabored. Environment secured. Will continue to monitor for safety. 

## 2023-10-10 NOTE — ED Notes (Signed)
 Pt is in the dayroom watching TV with peers. Pt denies SI/HI/AVH. Pt has no further complain.No acute distress noted. Will continue to monitor for safety and provide support.

## 2023-10-10 NOTE — ED Notes (Signed)
Patient in the bedroom calm and sleeping.   NAD.Respirations are even and unlabored. Will keep monitoring for safety.

## 2023-10-10 NOTE — ED Notes (Signed)
Patient is sleeping. Respirations equal and unlabored, skin warm and dry. No change in assessment or acuity. Routine safety checks conducted according to facility protocol. Will continue to monitor for safety.   

## 2023-10-11 ENCOUNTER — Encounter (HOSPITAL_COMMUNITY): Payer: Self-pay | Admitting: Nurse Practitioner

## 2023-10-11 DIAGNOSIS — F1090 Alcohol use, unspecified, uncomplicated: Secondary | ICD-10-CM | POA: Diagnosis not present

## 2023-10-11 MED ORDER — METFORMIN HCL ER 500 MG PO TB24
1000.0000 mg | ORAL_TABLET | Freq: Every day | ORAL | Status: DC
  Administered 2023-10-11 – 2023-10-13 (×3): 1000 mg via ORAL
  Filled 2023-10-11 (×3): qty 2

## 2023-10-11 MED ORDER — ATORVASTATIN CALCIUM 40 MG PO TABS
40.0000 mg | ORAL_TABLET | Freq: Every day | ORAL | Status: DC
  Administered 2023-10-11 – 2023-10-13 (×3): 40 mg via ORAL
  Filled 2023-10-11 (×3): qty 1

## 2023-10-11 NOTE — ED Notes (Signed)
[facility] went to break at 0500am - 0530am RN did not do rounds.

## 2023-10-11 NOTE — Group Note (Signed)
Group Topic: Recovery Basics  Group Date: 10/11/2023 Start Time: 1530 End Time: 1600 Facilitators: Jenean Lindau, RN  Department: [facility]  Number of Participants: 5  Group Focus: chemical dependency education and chemical dependency issues Treatment Modality:  Behavior Modification Therapy Interventions utilized were patient education, problem solving, and reality testing Purpose: enhance coping skills, explore maladaptive thinking, express feelings, express irrational fears, improve communication skills, increase insight, regain self-worth, and reinforce self-care  Name: Aaron [name] Date of Birth: 1976-08-04  MR: [id number]    Level of Participation: moderate Quality of Participation: attentive and cooperative Interactions with others: gave feedback Mood/Affect: appropriate Triggers (if applicable):   Cognition: goal directed Progress: Gaining insight Response:   Plan: follow-up needed  Patients Problems:  Patient Active Problem List   Diagnosis Date Noted   Alcohol abuse    Suicidal ideation    Alcohol withdrawal without perceptual disturbances (HCC) 04/08/2021   Alcohol dependence (HCC) 04/08/2021   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

## 2023-10-11 NOTE — Care Management (Addendum)
[facility] Care Management   Writer provided patient a listing of [address] vacancies for him to call and follow up.   Writer provided patient the number to [facility] [place] [phone].    Patient reports that he cannot return to [facility] until he pays a past due bill to them of $168.00.    Writer provided the patient with the phone number to [facility] [phone])  1:33pm  Writer contacted [employer] and they are still waiting on out of state verification of insurance.

## 2023-10-11 NOTE — ED Provider Notes (Cosign Needed Addendum)
[facility] Based Crisis Behavioral Health Progress Note  Date & Time: 10/11/2023 8:47 AM Name: Aaron [name] Age: 47 y.o.  DOB: [birth year]  MRN: [id number]  Diagnosis:  Final diagnoses:  Alcohol use disorder  Alcohol-induced depressive disorder with moderate or severe use disorder with onset during intoxication [facility])    Reason for presentation: Anxiety, Stress, and Addiction Problem  Brief HPI  Javier Mongillo [name] is a 47 y.o. male, with PMH alcohol use d/o (no h/o sz or DT), SIMD (alcohol), housing instability, past IP psych admission (08/2023 in [place], [place]), who presented Voluntary to [facility] (10/11/2023) as a transfer from [facility] ED then admitted to [facility] for alcohol detox and treatment for depression. He is interested in residential rehab.      Component Value Date/Time   ETH 60 (H) 10/07/2023 1701       Component Value Date/Time   LABOPIA NONE DETECTED 10/07/2023 1705   COCAINSCRNUR NONE DETECTED 10/07/2023 1705   LABBENZ POSITIVE (A) 10/07/2023 1705   AMPHETMU NONE DETECTED 10/07/2023 1705   THCU NONE DETECTED 10/07/2023 1705   LABBARB NONE DETECTED 10/07/2023 1705     Interval Hx   Patient Narrative:  CIWA 0, 0 over past 24 hours. On assessment today, patient continues to report pretty decent mood. He reports anxiety related to his discharge. He reports he may try to call [facility] if he is unable to go to a rehab facility. Discussed with patient to talk with SW. He reports no changes in his sleep or appetite. He denies any current withdrawal symptoms. He denies side effects from current medications. He denies SI/HI/AVH.   Review of Systems  Constitutional:  Negative for chills, diaphoresis, fever and malaise/fatigue.  Respiratory:  Negative for shortness of breath.   Cardiovascular:  Negative for chest pain.  Gastrointestinal:  Negative for abdominal pain, constipation, diarrhea and vomiting.  Musculoskeletal:         Chronic  Neurological:  Negative for dizziness, tremors, seizures, weakness and headaches.     Past History   Psychiatric History:  Dx: Depression, alcohol use disorder Prior Rx: Maybe sertraline OP psychiatrist: Denies OP therapist: Denies PCP: Patient, No Pcp Per  Suicide Attempt: Denies Inpatient psych: Yes, 08/2023 in [place], [place]  Psychiatric Family History:  Unsure  Social History:   Living: In hotel since returning to [facility] on Sunday night Income: None currently Trauma: Denies Social support: Mother and other family members  EtOH:  reports current alcohol use.  Tobacco:  reports that he has been smoking cigarettes. He has never used smokeless tobacco.  Cannabis: Denies Opiates: Denies Stimulants: Denies BZO/hypnotics: Denies Seizure/DT: Denies  Treatments: Denies, but chart shows [facility] [place] IVDU: Denies  Past Medical History:  Past Medical History:  Diagnosis Date   Depression    Diabetes mellitus without complication (HCC)    Hypertension    No past surgical history on file. Family History:  Family History  Problem Relation Age of Onset   Cancer Father    Social History   Substance and Sexual Activity  Alcohol Use Yes   Comment: heavy    Social History   Substance and Sexual Activity  Drug Use No    Social History   Socioeconomic History   Marital status: Single    Spouse name: Not on file   Number of children: Not on file   Years of education: Not on file   Highest education level: Not on file  Occupational  History   Not on file  Tobacco Use   Smoking status: Every Day    Current packs/day: 0.25    Types: Cigarettes   Smokeless tobacco: Never  Vaping Use   Vaping status: Never Used  Substance and Sexual Activity   Alcohol use: Yes    Comment: heavy   Drug use: No   Sexual activity: Not on file  Other Topics Concern   Not on file  Social History Narrative   Not on file   Social Determinants of Health   Financial  Resource Strain: Not on file  Food Insecurity: Food Insecurity Present (10/07/2023)   Hunger Vital Sign    Worried About Running Out of Food in the Last Year: Sometimes true    Ran Out of Food in the Last Year: Never true  Transportation Needs: No Transportation Needs (10/07/2023)   PRAPARE - Administrator, Civil Service (Medical): No    Lack of Transportation (Non-Medical): No  Physical Activity: Not on file  Stress: Not on file  Social Connections: Unknown (08/13/2021)   Received from [facility]   Social Connections    Frequency of Communication with Friends and Family: Not asked    Frequency of Social Gatherings with Friends and Family: Not asked   SDOH: SDOH Screenings   Food Insecurity: Food Insecurity Present (10/07/2023)  Housing: Low Risk  (10/07/2023)  Transportation Needs: No Transportation Needs (10/07/2023)  Utilities: Not At Risk (10/07/2023)  Alcohol Screen: Medium Risk (04/08/2021)  Depression (PHQ2-9): Medium Risk (10/10/2023)  Social Connections: Unknown (08/13/2021)   Received from [facility]  Tobacco Use: High Risk (10/07/2023)   Additional Social History: Alcohol / Drug Use Pain Medications: Unnamed antidepressant. Prescriptions: Diabetes and High choloesteral medication. Over the Counter: None. History of alcohol / drug use?: Yes Longest period of sobriety (when/how long): 5 months. Negative Consequences of Use:  (None identified.) Withdrawal Symptoms: None Substance #1 Name of Substance 1: Alcohol. 1 - Age of First Use: 22. 1 - Amount (size/oz): Unknown amount. 1 - Frequency: Daily. 1 - Duration: 1 month. 1 - Last Use / Amount: Today. 1 - Method of Aquiring: Purchase. 1- Route of Use: Orally. Current Medications   Current Facility-Administered Medications  Medication Dose Route Frequency Provider Last Rate Last Admin   acetaminophen (TYLENOL) tablet 650 mg  650 mg Oral Q6H PRN Bobbitt, Shalon E, NP       alum & mag  hydroxide-simeth (MAALOX/MYLANTA) 200-200-20 MG/5ML suspension 30 mL  30 mL Oral Q4H PRN Bobbitt, Shalon E, NP       atorvastatin (LIPITOR) tablet 40 mg  40 mg Oral Daily Karie Fetch, MD       benzonatate (TESSALON) capsule 100 mg  100 mg Oral TID PRN Lamar Sprinkles, MD   100 mg at 10/09/23 1731   magnesium hydroxide (MILK OF MAGNESIA) suspension 30 mL  30 mL Oral Daily PRN Bobbitt, Shalon E, NP       metFORMIN (GLUCOPHAGE-XR) 24 hr tablet 1,000 mg  1,000 mg Oral Q breakfast Karie Fetch, MD       multivitamin with minerals tablet 1 tablet  1 tablet Oral Daily Bobbitt, Shalon E, NP   1 tablet at 10/10/23 0938   naltrexone (DEPADE) tablet 50 mg  50 mg Oral QHS Lamar Sprinkles, MD   50 mg at 10/10/23 2120   sertraline (ZOLOFT) tablet 50 mg  50 mg Oral Daily Lamar Sprinkles, MD   50 mg at 10/10/23 6295   thiamine (VITAMIN  B1) injection 100 mg  100 mg Intramuscular Once Bobbitt, Shalon E, NP       thiamine (VITAMIN B1) tablet 100 mg  100 mg Oral Daily Bobbitt, Shalon E, NP   100 mg at 10/10/23 0938   traZODone (DESYREL) tablet 50 mg  50 mg Oral QHS PRN Bobbitt, Shalon E, NP   50 mg at 10/10/23 2119   Current Outpatient Medications  Medication Sig Dispense Refill   atorvastatin (LIPITOR) 40 MG tablet Take 40 mg by mouth daily.     metFORMIN (GLUCOPHAGE-XR) 500 MG 24 hr tablet Take 1,000 mg by mouth daily with breakfast.     sertraline (ZOLOFT) 50 MG tablet Take 50 mg by mouth daily.      Labs / Images  Lab Results:  Admission on 10/07/2023  Component Date Value Ref Range Status   TSH 10/07/2023 2.326  0.350 - 4.500 uIU/mL Final   Comment: Performed by a 3rd Generation assay with a functional sensitivity of <=0.01 uIU/mL. Performed at [facility], [address] [place], [place] [address]    Cholesterol 10/07/2023 172  0 - 200 mg/dL Final   Triglycerides 57/84/6962 268 (H)  <150 mg/dL Final   HDL 95/28/4132 37 (L)  >40 mg/dL Final   Total CHOL/HDL Ratio 10/07/2023 4.6  RATIO  Final   VLDL 10/07/2023 54 (H)  0 - 40 mg/dL Final   LDL Cholesterol 10/07/2023 81  0 - 99 mg/dL Final   Comment:        Total Cholesterol/HDL:CHD Risk Coronary Heart Disease Risk Table                     Men   Women  1/2 Average Risk   3.4   3.3  Average Risk       5.0   4.4  2 X Average Risk   9.6   7.1  3 X Average Risk  23.4   11.0        Use the calculated Patient Ratio above and the CHD Risk Table to determine the patient's CHD Risk.        ATP [name] CLASSIFICATION (LDL):  <100     mg/dL   Optimal  440-102  mg/dL   Near or Above                    Optimal  130-159  mg/dL   Borderline  725-366  mg/dL   High  >440     mg/dL   Very High Performed at [facility], [address] [place], [place] [address]    Hgb A1c MFr Bld 10/10/2023 5.9 (H)  4.8 - 5.6 % Final   Comment: (NOTE) Pre diabetes:          5.7%-6.4%  Diabetes:              >6.4%  Glycemic control for   <7.0% adults with diabetes    Mean Plasma Glucose 10/10/2023 122.63  mg/dL Final   Performed at [facility], [address] [place], [place] [address]  Admission on 10/07/2023, Discharged on 10/07/2023  Component Date Value Ref Range Status   Sodium 10/07/2023 136  135 - 145 mmol/L Final   Potassium 10/07/2023 3.2 (L)  3.5 - 5.1 mmol/L Final   Chloride 10/07/2023 97 (L)  98 - 111 mmol/L Final   CO2 10/07/2023 23  22 - 32 mmol/L Final   Glucose, Bld 10/07/2023 141 (H)  70 - 99 mg/dL Final   Glucose reference range applies only to samples taken after fasting for at least 8 hours.   BUN 10/07/2023 16  6 - 20 mg/dL Final   Creatinine, Ser 10/07/2023 0.77  0.61 - 1.24 mg/dL Final   Calcium 32/44/0102 8.9  8.9 - 10.3 mg/dL Final   Total Protein 72/53/6644 8.5 (H)  6.5 - 8.1 g/dL Final   Albumin 03/47/4259 4.0  3.5 - 5.0 g/dL Final   AST 56/38/7564 29  15 - 41 U/L Final   ALT 10/07/2023 35  0 - 44 U/L Final   Alkaline Phosphatase 10/07/2023 89  38 - 126 U/L Final   Total Bilirubin 10/07/2023 0.5   <1.2 mg/dL Final   GFR, Estimated 10/07/2023 >60  >60 mL/min Final   Comment: (NOTE) Calculated using the CKD-EPI Creatinine Equation (2021)    Anion gap 10/07/2023 16 (H)  5 - 15 Final   Performed at [facility], [address], [place], [place] [address]   Alcohol, Ethyl (B) 10/07/2023 60 (H)  <10 mg/dL Final   Comment: (NOTE) Lowest detectable limit for serum alcohol is 10 mg/dL.  For medical purposes only. Performed at [facility], [address], [place], [place] [address]    Opiates 10/07/2023 NONE DETECTED  NONE DETECTED Final   Cocaine 10/07/2023 NONE DETECTED  NONE DETECTED Final   Benzodiazepines 10/07/2023 POSITIVE (A)  NONE DETECTED Final   Amphetamines 10/07/2023 NONE DETECTED  NONE DETECTED Final   Tetrahydrocannabinol 10/07/2023 NONE DETECTED  NONE DETECTED Final   Barbiturates 10/07/2023 NONE DETECTED  NONE DETECTED Final   Comment: (NOTE) DRUG SCREEN FOR MEDICAL PURPOSES ONLY.  IF CONFIRMATION IS NEEDED FOR ANY PURPOSE, NOTIFY LAB WITHIN 5 DAYS.  LOWEST DETECTABLE LIMITS FOR URINE DRUG SCREEN Drug Class                     Cutoff (ng/mL) Amphetamine and metabolites    1000 Barbiturate and metabolites    200 Benzodiazepine                 200 Opiates and metabolites        300 Cocaine and metabolites        300 THC                            50 Performed at [facility], [address], [place], [place] [address]    WBC 10/07/2023 9.8  4.0 - 10.5 K/uL Final   RBC 10/07/2023 4.90  4.22 - 5.81 MIL/uL Final   Hemoglobin 10/07/2023 14.4  13.0 - 17.0 g/dL Final   HCT 01/60/1093 43.1  39.0 - 52.0 % Final   MCV 10/07/2023 88.0  80.0 - 100.0 fL Final   MCH 10/07/2023 29.4  26.0 - 34.0 pg Final   MCHC 10/07/2023 33.4  30.0 - 36.0 g/dL Final   RDW 23/55/7322 15.1  11.5 - 15.5 % Final   Platelets 10/07/2023 414 (H)  150 - 400 K/uL Final   nRBC 10/07/2023 0.0  0.0 - 0.2 % Final   Neutrophils Relative %  10/07/2023 65  % Final   Neutro Abs 10/07/2023 6.3  1.7 - 7.7 K/uL Final   Lymphocytes Relative 10/07/2023 27  % Final   Lymphs Abs 10/07/2023 2.7  0.7 - 4.0 K/uL Final   Monocytes Relative 10/07/2023 6  % Final   Monocytes Absolute 10/07/2023 0.6  0.1 - 1.0 K/uL Final   Eosinophils Relative 10/07/2023 2  % Final   Eosinophils Absolute 10/07/2023 0.2  0.0 - 0.5 K/uL Final   Basophils Relative 10/07/2023 0  % Final   Basophils Absolute 10/07/2023 0.0  0.0 - 0.1 K/uL Final   Immature Granulocytes 10/07/2023 0  % Final   Abs Immature Granulocytes 10/07/2023 0.03  0.00 - 0.07 K/uL Final   Performed at [facility], [address] [place], [place] [address]   Acetaminophen (Tylenol), Serum 10/07/2023 <10 (L)  10 - 30 ug/mL Final   Comment: (NOTE) Therapeutic concentrations vary significantly. A range of 10-30 ug/mL  may be an effective concentration for many patients. However, some  are best treated at concentrations outside of this range. Acetaminophen concentrations >150 ug/mL at 4 hours after ingestion  and >50 ug/mL at 12 hours after ingestion are often associated with  toxic reactions.  Performed at [facility], [address] [place], [place] [address]    Salicylate Lvl 10/07/2023 <7.0 (L)  7.0 - 30.0 mg/dL Final   Performed at [facility], [address], [place], [place] [address]   Blood Alcohol level:  Lab Results  Component Value Date   ETH 60 (H) 10/07/2023   ETH <10 05/10/2021   Metabolic Disorder Labs: Lab Results  Component Value Date   HGBA1C 5.9 (H) 10/10/2023   MPG 122.63 10/10/2023   No results found for: "PROLACTIN" Lab Results  Component Value Date   CHOL 172 10/07/2023   TRIG 268 (H) 10/07/2023   HDL 37 (L) 10/07/2023   CHOLHDL 4.6 10/07/2023   VLDL 54 (H) 10/07/2023   LDLCALC 81 10/07/2023   Therapeutic Lab Levels: No results found for: "LITHIUM" No results found for: "VALPROATE" No  results found for: "CBMZ" Physical Findings   AIMS    Flowsheet Row [facility]) [facility] Ad[facility]) [facility]  AIMS Total Score 0 0      AUDIT    Flowsheet Row [facility]) [facility] Ad[facility] (Dis[facility]) [facility]  Alcohol Use Disorder Identification Test Final Score (AUDIT) 30 27      PHQ2-9    Flowsheet Row ED from 10/07/2023 in [facility]  PHQ-2 Total Score 3  PHQ-9 Total Score 10      Flowsheet Row ED from 10/07/2023 in [facility] Most recent reading at 10/07/2023 11:49 PM ED from 10/07/2023 in [facility] Emergency Department at [facility] Most recent reading at 10/07/2023  3:58 PM ED from 07/07/2021 in [facility] Emergency Department at [facility] Most recent reading at 07/07/2021  4:06 PM  C-SSRS RISK CATEGORY High Risk High Risk No Risk       Musculoskeletal  Strength & Muscle Tone: within normal limits Gait & Station: normal Patient leans: N/A   Strength & Muscle Tone: within normal limits Gait & Station: normal Patient leans: N/A  Psychiatric Specialty Exam   Presentation  General Appearance:Appropriate for Environment Eye Contact:Good Speech:Clear and Coherent Volume:Normal Handedness:Right  Mood and Affect  Mood:Anxious Affect:Congruent  Thought Process  Thought Process:Coherent Descriptions of Associations:Intact  Thought Content Suicidal Thoughts:Suicidal Thoughts: No Homicidal Thoughts:Homicidal Thoughts: No Hallucinations:Hallucinations: None Ideas of Reference:None Thought Content:Logical  Sensorium  Memory:Immediate Good, Recent Good, Remote Good Judgment:Intact Insight:Shallow  Executive Functions   Orientation:Full (Time, Place and Person) Language:Fair Concentration:Fair Attention:Fair Recall:Fair [employer] of Knowledge:Fair  Psychomotor Activity  Psychomotor Activity:Psychomotor Activity: Normal  Assets  Assets:Communication Skills, Desire for Improvement, Social Support  Sleep  Quality:Fair  Physical Exam and Labs  BP 109/76   Pulse 65   Temp 98.7 F (37.1 C) (Oral)   Resp 16   SpO2 98%  Physical Exam Vitals reviewed.  Constitutional:      General: He is not in acute distress. HENT:     Head: Normocephalic and atraumatic.  Pulmonary:     Effort: Pulmonary effort is normal.  Skin:    General: Skin is warm and dry.  Neurological:     General: No focal deficit present.     Mental Status: He is alert and oriented to person, place, and time.     Motor: No weakness.     Gait: Gait normal.      EKG: normal sinus rhythm, nonspecific T waves changes, QTc 435.   Assessment / Plan  Total Time spent with patient: 1 hour Treatment Plan Summary: Daily contact with patient to assess and evaluate symptoms and progress in treatment and Medication management  Principal Problem:   Alcohol dependence (HCC)  Tres Parlette [name] is a 47 y.o. male with  history of alcohol use disorder with recent relapse after 8 months leading to alcohol-induced depression and anxiety. Patient motivated to achieve sobriety and would like to pursue residential treatment at this time. He would like for mood symptoms to be addressed and is advised that alcohol cessation would improve his symptoms significantly, but in the interim, may aid in depressive symptoms. Zoloft and Naltrexone r/b/ae discussed with patient, and he is agreeable to a trial. Patient denies hx of opiate use, UDS pos for BZD (received in ED), and LFTs WNL. Will proceed with Naltrexone at this time. He would like to go to inpatient rehab however we discussed limitations of this given his insurance is in [place].   #Alcohol Use  Disorder #Alcohol-Induced Depression and Anxiety CIWA Ativan protocol initiated: -d/c scheduled ativan taper, last drink 11/16. Continue CIWA with PRN ativan.  -lorazepam (Ativan) 1 mg every 6 hours as needed for CIWA greater than 10; Hydroxyzine 25 mg for CIWA less than 10 -Multivitamin with minerals 1 tablet daily -Ondansetron disintegrating tablet 4 mg every 6 as needed/nausea or vomiting -Loperamide 2 to 4 mg oral as needed/diarrhea or loose stools -Thiamine injection 100 mg IM once followed by Thiamine tablet 100 mg daily. -- Continue Zoloft 50 mg for depressive and anxiety sx -- Continue Naltrexone 50 mg at bedtime for alcohol use disorder.   #Hist of HTN --24 hour BP 110-122/75-83; MR 120/80. Patient reports home medication Lisinopril (unsure of medication and dosage).  -As patient normotensive, will refrain from starting at this time.   #Hist of T2DM --A1c 5.9.  -restart home metformin 1000mg  daily  #Hypertriglyceridemia -- TG 268 this admission.  Likely elevated in the setting of chronic alcohol use, obesity, and other factors.  - restart home lipitor -- Outpatient PCP follow-up.  Considerations for follow-up: Recommend monitoring LFT every 6-12 months while on naltrexone  DISPO:  amenable to residential rehab. Will attempt to dc patient door-to-door, however if unable will discuss with CD-IOP as bridge to rehab. Patient insurance may be a barrier Tentative date: Friday Location: TBD   Signed: Karie Fetch, MD, PGY-2 10/11/2023, 8:47 AM   [facility] [address] [place], [place] [address] Dept: [phone] Dept  Fax: [phone]

## 2023-10-11 NOTE — Group Note (Signed)
Group Topic: Overcoming Obstacles  Group Date: 10/11/2023 Start Time: 1000 End Time: 1104 Facilitators: Vonzell Schlatter B  Department: [facility]  Number of Participants: 5  Group Focus: activities of daily living skills and affirmation Treatment Modality:  Psychoeducation Interventions utilized were problem solving and support Purpose: increase insight and reinforce self-care  Name: Aaron [name] Date of Birth: 11-14-1976  MR: [id number]    Level of Participation: active Quality of Participation: attentive and cooperative Interactions with others: gave feedback Mood/Affect: positive Triggers (if applicable): n/a Cognition: coherent/clear Progress: Moderate Response: pt is ready to get into a facility Plan: follow-up needed  Patients Problems:  Patient Active Problem List   Diagnosis Date Noted   Alcohol abuse    Suicidal ideation    Alcohol withdrawal without perceptual disturbances (HCC) 04/08/2021   Alcohol dependence (HCC) 04/08/2021   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

## 2023-10-11 NOTE — ED Notes (Signed)
Patient is sleeping. Respirations equal and unlabored, skin warm and dry, NAD. No change in assessment or acuity. Routine safety checks conducted according to facility protocol. Will continue to monitor for safety.   

## 2023-10-11 NOTE — Group Note (Signed)
Group Topic: Relapse and Recovery  Group Date: 10/11/2023 Start Time: 2000 End Time: 2100 Facilitators: Rae Lips B  Department: [facility]  Number of Participants: 3  Group Focus: acceptance, activities of daily living skills, chemical dependency education, co-dependency, and communication Treatment Modality:  Leisure Development Interventions utilized were leisure development, problem solving, story telling, and support Purpose: express feelings  Name: Manvik Kozloff III Date of Birth: 1976-04-08  MR: [id number]    Level of Participation: active Quality of Participation: attentive, cooperative, initiates communication, motivated, and offered feedback Interactions with others: gave feedback Mood/Affect: appropriate, brightens with interaction, and positive Triggers (if applicable): NA Cognition: coherent/clear Progress: Gaining insight Response: NA Plan: patient will be encouraged to keep going to groups.   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

## 2023-10-11 NOTE — ED Notes (Signed)
Patient is awake and alert on unit.  He is calm and cooperative with care.  Patient without complaint or signs of withdrawal at this time.  Will monitor and provide safe environment.

## 2023-10-11 NOTE — ED Notes (Signed)
Patient has been awake and alert on unit.  He is calm and pleasant on approach.  He attended group this morning and ate lunch afterward.  Patient observed making phone calls to attempt to secure aftercare.  NO withdrawal.  CIWA 0.  Will monitor.

## 2023-10-11 NOTE — ED Notes (Signed)
Patient is sleeping. Respirations equal and unlabored, skin warm and dry. No change in assessment or acuity. Routine safety checks conducted according to facility protocol. Will continue to monitor for safety.   

## 2023-10-11 NOTE — ED Notes (Signed)
 Pt is in the dayroom watching TV with peers. Pt denies SI/HI/AVH. Pt has no further complain.No acute distress noted. Will continue to monitor for safety and provide support.

## 2023-10-12 DIAGNOSIS — F1090 Alcohol use, unspecified, uncomplicated: Secondary | ICD-10-CM | POA: Diagnosis not present

## 2023-10-12 NOTE — ED Notes (Signed)
Patient observed resting quietly, eyes closed. Respirations equal and unlabored. Will continue to monitor for safety.  

## 2023-10-12 NOTE — ED Notes (Signed)
Patient was provided lunch.

## 2023-10-12 NOTE — ED Notes (Signed)
Pt sleeping@this time breathing even and unlabored will continue to monitor for safety 

## 2023-10-12 NOTE — Discharge Instructions (Addendum)
Based on the information that you have provided and the presenting issues outpatient services and resources for have been recommended.  It is imperative that you follow through with treatment recommendations within 5-7 days from the of discharge to mitigate further risk to your safety and mental well-being. A list of referrals has been provided below to get you started.  You are not limited to the list provided.  In case of an urgent crisis, you may contact the Mobile Crisis Unit with [facility] at [phone].   .. OBS Care Management   Base on the information you have provided and the presenting issue, outpatient services and resources for have been recommended.  It is imperative that you follow through with treatment recommendations within 5-7 days from the of discharge to mitigate further risk to your safety and mental well-being. A list of referrals has been provided below to get you started.  You are not limited to the list provided.  In case of an urgent crisis, you may contact the Mobile Crisis Unit with [facility] at [phone].      Residential Treatment  Facilities Medicaid Detox No Insurance Engineer, site ([facility]) [address] [place], [place] [phone] or [phone]   No  Yes  Yes  Yes   [facility] Facility [phone] [place], [place] [address] [phone] Admissions: 8am-3pm  M-F   [place] only  No  Yes  No    [facility] [phone]   No  Yes  No- out of pocket 16,000  Yes   RTS (Residential Treatment Services) [address] [place], [place] [phone]   Yes- No medicare  Yes   Yes, [place]   [address] only    [address] [place], [place], [address] [phone]   No  No  Yes but private pay, offers some sponsorships  Does not take insurance    Path of  [place], [place] [phone]       No  No  Yes- Shelly Coss and Loann Quill out of pocket if not in those counties. 3,220.00 for 28 days.   No   Residential Treatment  Facilities   Medicaid  Detox  No IT trainer (multiple locations throughout the country)  Intake: [phone]    No   Yes   No   Yes   [facility]  [facility] [place], [place] [phone] (takes everyone as long as they meet detox criteria)   Yes  Yes   Yes  Yes   [address] [place], [place]  [phone] 27 locations    No  No- sober living house  90.00-130.00 per week per person  Will [facility] Part of [place] Outreach [phone] [email or web address] .Lorayne Marek [facility] Part of [place] Outreach [phone] [email or web address]    No   [facility] [address]  [place] [phone] [email or web address]     No  No  1,200.00 a 200.00 deposit is required at start of treatment Payment plans accepted Ephriam Knuckles based program  No   Residential Treatment  Facilities   Medicaid  Detox  No [facility] [place] [address]  [place], [place], [address] [phone]  No  Yes       Yes  Regular Rehab: 7,500.00 28 days Dual Diagnosis: 8,900.00 28 days 7 day detox:  2.700.00 or 3,400.00 for Dual Diagnosis   Yes   Outpatient Treatment  Facilities Medicaid Detox No [facility] IOP [address]  [place], [place], [address] [phone]   No  No  No  Yes    [facility] and Partial Hospitalization Program  (If substance abuse is secondary diagnosis) [address],  [place], [place] [address] [phone]   Yes-[place] and [place] Only for Partial   No   No   Yes- IOP  [facility]  [address] [place]. [address] [place],  [place] [phone] (offers adult AND adolescent Intensive Outpatient services) (also in [place], [place], [place] and [place])      No No IOP- does some sponsorships on individual basis Yes   Outpatient Treatment  Facilities Medicaid Detox No [facility] [address]  [place], [place], [address] [phone]   Yes  They would [facility] at [facility] then be transferred to a detox unit    Yes- self pay     Yes    ADS: [facility] [address]  [place], [place], [address] And  [address],  [place], [place] [address] [phone]   Yes  No    Yes most qualify for state funding IOP and Opiod treatment- Offers Methadone  UHC, [place], [place]   [facility] [place]  [phone]   No  Yes (in residential treatment program)   No   Insurance Only    [facility] [address] [place], [place],  [phone]   Yes but not for suboxone treatment  Yes- opiates with suboxone have to commit to 8 week IOP   Yes 595.00 for first visit  150.00 for prescription 150.00 a week after that for group    Yes   [facility] [address]  [place], [place] [phone]  No- has a waiting list about to be approved No Yes- but has to be self pay  500.00 for 1st 2 weeks  500 for next 2 weeks and  750 mo. Ongoing Yes   Outpatient Treatment  Facilities Medicaid Detox No Insurance [facility]   Insight Program [phone]  [address] [place], [place] [phone]  No No Limited sponsorships IOP- 9,500.00 8-15 weeks If paid upfront gives a 500.00 deduction Outpatient- 1 day a week  9 weeks 4,500.00 has payment plans   Yes- out of network though   [facility] (Groups/Residential) [place], [place]  [phone]  Yes- [place] No IOP facility  Yes  No   [facility]  [address]urell [address] [place], [place] [phone]  No No Out of  pocket only- depends on the situation  Allow people to do services on a flexible  payment plan- billed every 90 days based on income  35.00 per group- 2 hour session  *DUI assessments  and *education for charges  *evaluations   No  Outpatient Treatment  Facilities Medicaid Detox No Insurance Private  Insurance   [facility] [place]  [address], [place], [address] [phone]   Yes  No  Yes  Yes    Mobile Crisis: [facility]: [phone]  (For crisis response 24 hours a day) [facility] Hotline: [phone]   .Marland Kitchen  [employer] [facility] [place] ,[place], [place]    Male [address], [address]  [place]  Address: [address] [place] [address] [place], [place] [address] Phone#: [phone] [facility] [place] [address] [place], [place] [address] Phone#: [phone] [facility] Address: [address] [place], [place] [address] Phone#: [phone] [facility] Address: [address] [place], [place] [address]  Phone#: [phone] [facility] Address: [address], [place] [address] Phone#: [phone] [facility]  Address: [address] [place], [place] [address]  Phone#: [phone]  [facility] [place] Address: [address] [place]. [place], [place] [address] Phone#: [phone] [facility] Address: [address] [place]. [place], [place] [address] Phone#: [phone] [facility] Address: [address] [place], [place] [address] Phone#: [phone] [facility] Address: [address] [place]. [place], [place] [address] Phone#: [phone] [facility]  Address: [address] [place], [place] [address] Phone#: [phone] [facility] [place] Address: [address] [place], [place] [address] Phone#: [phone]   [facility]  [address] [place], [place] [address] Phone#: [phone] [facility]  [place] Address: [address] [place], [place] [address] Phone#: [phone]   : [facility] are peer-driven, democratically run, and self-supported group residences for individuals in recovery from a substance use disorder.  Basic House Rules:  Houses Engineer, civil (consulting), electing house officers who serve six-month terms. Houses are financially self-supporting; members split house expenses, which average $110 to $160 per person weekly. Any Resident who has a recurrence of the use of alcohol and/or illicit drugs must be immediately expelled    .Marland KitchenShelter List:   [facility]          -(Please Call) Phone: [phone]   Provides housing and specialized case management focused on housing stability.     [facility] NIGHT SHELTER) [address] [place], [place] Phone: [phone]   [facility] has been providing emergency shelter to those in need of a permanent residence for over 35 years. The [employer] shelter plays an important role in our community.   There are many life events that can pull someone into a downward spiral towards poverty that is very difficult to get out of. Homelessness is a problem that can affect anyone of [place]. [employer] is a safe and comforting place to stay, especially if you have experienced the hardship of street life.   [employer] provides a single bed and bedding to 100 adult men and women. The shelter welcomes all who are in need of housing, no one in real need is turned away unless space is not available.   While staying at [facility], guests are offered more than just a bed for a night. Hot meals are provided and every guest has access to case management services. Case managers provide assistance with finding housing, employment, or other services that will help them gain stability. Continuous stay is based on availability, capacity, and progress towards goals.    To contact the front desk of [facility] please call   [phone] ext [phone] or ext. [phone].   [facility] [address] [place], [place] [address] Phone: [phone]   [facility] (Women only) [address] [place], [place] [address] Phone: [phone]   [facility] Address: [address] [place], [place] [address] Hours:   Opens 9?AM Mon Phone: [phone]  The [facility] providers a continuum of housing services to those  experiencing homelessness. They provide transitional [employer] and permanent supportive housing ([place] and [employer]) to disabled veterans experiencing homelessness. There is a fast track Rapid Rehousing program couples housing stability services with temporary financial assistance to quickly house individuals and families experiencing homelessness.   [employer] [address], [place] [address] Phone: [phone]   [facility] [place] [address] [place], [place] [address] Phone: [phone]   [facility] Overflow Shelter [address] [place], [place] [address] Check in at 6:00PM for placement at a local shelter) Phone: [phone]   [facility] Eligibility:  Must be drug and alcohol free for at least 14 days or more at the time of application. This program serves males.  Houses Engineer, civil (consulting), Economist who serve six-month terms.  Houses are financially self-supporting; members split house expenses, which average $90.00 to $130.00 per person per week.  Any Resident who relapses must be immediately expelled. Call:  [phone]   [facility]  Address: [address] [place] [address] [place], [place] [address]  Phone#: [phone] [facility] Address: [address] [place], [place] [address] Phone: [phone]  -The [facility] provides food, shelter and other  programs and services to the homeless men of [place] through our [employer] program.  By offering safe shelter, three meals a day, clean clothing, Biblical counseling, financial planning, vocational training, GED/education and employment assistance, we've helped mend the shattered lives of many homeless men since opening in [place].  We have approximately 267 beds available, with a max of 312 beds including mats for emergency situations and currently house an average of 270 men a night.  Prospective Client Check-In Information Photo ID Required (State/ Out of State/ [facility]) - if photo ID is not available, clients are required to have a printout of a police/sheriff's criminal history report. Help out with chores around the [facility]. No sex offender of any type (pending, charged, registered and/or any other sex related offenses) will be permitted to check in. Must be willing to abide by all rules, regulations, and policies established by the [employer]. The following will be provided - shelter, food, clothing, and biblical counseling. If you or someone you know is in need of assistance at our [facility] shelter in [place], [place], please call [phone] ext. [phone].  Women Shelter for [employer] hours are Monday-Friday only.   Primary Care Provider Options  [facility] [address], [place], [place] [address] Monday-Friday: 8 AM-5:30 PM Phone [phone]   [facility] [address], [place], [place], [address] Monday-Friday: 8 AM-4:30 PM Phone [phone] Family medicine  Patient Care at [facility] [address], [place], [place] [address] Monday-Friday: 8 AM-5 PM Phone [phone] [facility] medicine  [facility] [address], [place], [place] [address] Monday-Friday: 8 AM-5:30 PM Phone: [phone] Primary CARE

## 2023-10-12 NOTE — ED Provider Notes (Signed)
[facility] Based Crisis Behavioral Health Progress Note  Date & Time: 10/12/2023 9:34 AM Name: Aaron [name] Age: 47 y.o.  DOB: [birth year]  MRN: [id number]  Diagnosis:  Final diagnoses:  Alcohol use disorder  Alcohol-induced depressive disorder with moderate or severe use disorder with onset during intoxication [facility])   Reason for presentation: Anxiety, Stress, and Addiction Problem  Brief HPI  Aaron [name] is a 47 y.o. male, with PMH alcohol use d/o (no h/o sz or DT), SIMD (alcohol), housing instability, past IP psych admission (08/2023 in [place], [place]), who presented Voluntary to [facility] (10/12/2023) as a transfer from [facility] ED then admitted to [facility] for alcohol detox and treatment for depression. He is interested in residential rehab.      Component Value Date/Time   ETH 60 (H) 10/07/2023 1701       Component Value Date/Time   LABOPIA NONE DETECTED 10/07/2023 1705   COCAINSCRNUR NONE DETECTED 10/07/2023 1705   LABBENZ POSITIVE (A) 10/07/2023 1705   AMPHETMU NONE DETECTED 10/07/2023 1705   THCU NONE DETECTED 10/07/2023 1705   LABBARB NONE DETECTED 10/07/2023 1705     Interval Hx   Patient Narrative:  CIWA 0 over past 24 hours. He attended groups. On assessment today, patient reports anxiety related to discharge planning. He reports that he has a follow-up phone call with an [facility] today at 1pm but if that doesn't work out his back-up plan is to go to [facility] on Monday. He reports no changes in his sleep or appetite. He denies any current withdrawal symptoms. He denies side effects from current medications. He denies SI/HI/AVH.   Review of Systems  Constitutional:  Negative for chills, diaphoresis, fever and malaise/fatigue.  Respiratory:  Negative for shortness of breath.   Cardiovascular:  Negative for chest pain.  Gastrointestinal:  Negative for abdominal pain, constipation,  diarrhea and vomiting.  Musculoskeletal:        Chronic  Neurological:  Negative for dizziness, tremors, seizures, weakness and headaches.     Past History   Psychiatric History:  Dx: Depression, alcohol use disorder Prior Rx: Maybe sertraline OP psychiatrist: Denies OP therapist: Denies PCP: Patient, No Pcp Per  Suicide Attempt: Denies Inpatient psych: Yes, 08/2023 in [place], [place]  Psychiatric Family History:  Unsure  Social History:   Living: In hotel since returning to [facility] on Sunday night Income: None currently Trauma: Denies Social support: Mother and other family members  EtOH:  reports current alcohol use.  Tobacco:  reports that he has been smoking cigarettes. He has never used smokeless tobacco.  Cannabis: Denies Opiates: Denies Stimulants: Denies BZO/hypnotics: Denies Seizure/DT: Denies  Treatments: Denies, but chart shows [facility] IVDU: Denies  Past Medical History:  Past Medical History:  Diagnosis Date   Depression    Diabetes mellitus without complication (HCC)    Hypertension    History reviewed. No pertinent surgical history. Family History:  Family History  Problem Relation Age of Onset   Cancer Father    Social History   Substance and Sexual Activity  Alcohol Use Yes   Comment: heavy    Social History   Substance and Sexual Activity  Drug Use No    Social History   Socioeconomic History   Marital status: Single    Spouse name: Not on file   Number of children: Not on file   Years of education: Not on file   Highest  education level: Not on file  Occupational History   Not on file  Tobacco Use   Smoking status: Every Day    Current packs/day: 0.25    Types: Cigarettes   Smokeless tobacco: Never  Vaping Use   Vaping status: Never Used  Substance and Sexual Activity   Alcohol use: Yes    Comment: heavy   Drug use: No   Sexual activity: Not on file  Other Topics Concern   Not on file  Social History Narrative    Not on file   Social Determinants of Health   Financial Resource Strain: Not on file  Food Insecurity: Food Insecurity Present (10/07/2023)   Hunger Vital Sign    Worried About Running Out of Food in the Last Year: Sometimes true    Ran Out of Food in the Last Year: Never true  Transportation Needs: No Transportation Needs (10/07/2023)   PRAPARE - Administrator, Civil Service (Medical): No    Lack of Transportation (Non-Medical): No  Physical Activity: Not on file  Stress: Not on file  Social Connections: Unknown (08/13/2021)   Received from [facility]   Social Connections    Frequency of Communication with Friends and Family: Not asked    Frequency of Social Gatherings with Friends and Family: Not asked   SDOH: SDOH Screenings   Food Insecurity: Food Insecurity Present (10/07/2023)  Housing: Low Risk  (10/07/2023)  Transportation Needs: No Transportation Needs (10/07/2023)  Utilities: Not At Risk (10/07/2023)  Alcohol Screen: Medium Risk (04/08/2021)  Depression (PHQ2-9): Medium Risk (10/10/2023)  Social Connections: Unknown (08/13/2021)   [facility] from [facility]  Tobacco Use: High Risk (10/11/2023)   Additional Social History: Alcohol / Drug Use Pain Medications: Unnamed antidepressant. Prescriptions: Diabetes and High choloesteral medication. Over the Counter: None. History of alcohol / drug use?: Yes Longest period of sobriety (when/how long): 5 months. Negative Consequences of Use:  (None identified.) Withdrawal Symptoms: None Substance #1 Name of Substance 1: Alcohol. 1 - Age of First Use: 22. 1 - Amount (size/oz): Unknown amount. 1 - Frequency: Daily. 1 - Duration: 1 month. 1 - Last Use / Amount: Today. 1 - Method of Aquiring: Purchase. 1- Route of Use: Orally. Current Medications   Current Facility-Administered Medications  Medication Dose Route Frequency Provider Last Rate Last Admin   acetaminophen (TYLENOL) tablet 650 mg  650 mg  Oral Q6H PRN Bobbitt, Shalon E, NP       alum & mag hydroxide-simeth (MAALOX/MYLANTA) 200-200-20 MG/5ML suspension 30 mL  30 mL Oral Q4H PRN Bobbitt, Shalon E, NP   30 mL at 10/11/23 1420   atorvastatin (LIPITOR) tablet 40 mg  40 mg Oral Daily Karie Fetch, MD   40 mg at 10/12/23 0930   benzonatate (TESSALON) capsule 100 mg  100 mg Oral TID PRN Lamar Sprinkles, MD   100 mg at 10/09/23 1731   magnesium hydroxide (MILK OF MAGNESIA) suspension 30 mL  30 mL Oral Daily PRN Bobbitt, Shalon E, NP       metFORMIN (GLUCOPHAGE-XR) 24 hr tablet 1,000 mg  1,000 mg Oral Q breakfast Karie Fetch, MD   1,000 mg at 10/12/23 0848   multivitamin with minerals tablet 1 tablet  1 tablet Oral Daily Bobbitt, Shalon E, NP   1 tablet at 10/12/23 0930   naltrexone (DEPADE) tablet 50 mg  50 mg Oral QHS Lamar Sprinkles, MD   50 mg at 10/11/23 2111   sertraline (ZOLOFT) tablet 50 mg  50 mg  Oral Daily Lamar Sprinkles, MD   50 mg at 10/12/23 0930   thiamine (VITAMIN B1) injection 100 mg  100 mg Intramuscular Once Bobbitt, Shalon E, NP       thiamine (VITAMIN B1) tablet 100 mg  100 mg Oral Daily Bobbitt, Shalon E, NP   100 mg at 10/12/23 0930   traZODone (DESYREL) tablet 50 mg  50 mg Oral QHS PRN Bobbitt, Shalon E, NP   50 mg at 10/10/23 2119   Current Outpatient Medications  Medication Sig Dispense Refill   atorvastatin (LIPITOR) 40 MG tablet Take 40 mg by mouth daily.     metFORMIN (GLUCOPHAGE-XR) 500 MG 24 hr tablet Take 1,000 mg by mouth daily with breakfast.     sertraline (ZOLOFT) 50 MG tablet Take 50 mg by mouth daily.      Labs / Images  Lab Results:  Admission on 10/07/2023  Component Date Value Ref Range Status   TSH 10/07/2023 2.326  0.350 - 4.500 uIU/mL Final   Comment: Performed by a 3rd Generation assay with a functional sensitivity of <=0.01 uIU/mL. Performed at [facility], [address] [place], [place] [address]    Cholesterol 10/07/2023 172  0 - 200 mg/dL Final   Triglycerides  10/07/2023 268 (H)  <150 mg/dL Final   HDL 56/43/3295 37 (L)  >40 mg/dL Final   Total CHOL/HDL Ratio 10/07/2023 4.6  RATIO Final   VLDL 10/07/2023 54 (H)  0 - 40 mg/dL Final   LDL Cholesterol 10/07/2023 81  0 - 99 mg/dL Final   Comment:        Total Cholesterol/HDL:CHD Risk Coronary Heart Disease Risk Table                     Men   Women  1/2 Average Risk   3.4   3.3  Average Risk       5.0   4.4  2 X Average Risk   9.6   7.1  3 X Average Risk  23.4   11.0        Use the calculated Patient Ratio above and the CHD Risk Table to determine the patient's CHD Risk.        ATP [name] CLASSIFICATION (LDL):  <100     mg/dL   Optimal  188-416  mg/dL   Near or Above                    Optimal  130-159  mg/dL   Borderline  606-301  mg/dL   High  >601     mg/dL   Very High Performed at [facility], [address] [place], [place] [address]    Hgb A1c MFr Bld 10/10/2023 5.9 (H)  4.8 - 5.6 % Final   Comment: (NOTE) Pre diabetes:          5.7%-6.4%  Diabetes:              >6.4%  Glycemic control for   <7.0% adults with diabetes    Mean Plasma Glucose 10/10/2023 122.63  mg/dL Final   Performed at [facility], [address], [place], [place] [address]  Admission on 10/07/2023, Discharged on 10/07/2023  Component Date Value Ref Range Status   Sodium 10/07/2023 136  135 - 145 mmol/L Final   Potassium 10/07/2023 3.2 (L)  3.5 - 5.1 mmol/L Final   Chloride 10/07/2023 97 (L)  98 - 111 mmol/L Final  CO2 10/07/2023 23  22 - 32 mmol/L Final   Glucose, Bld 10/07/2023 141 (H)  70 - 99 mg/dL Final   Glucose reference range applies only to samples taken after fasting for at least 8 hours.   BUN 10/07/2023 16  6 - 20 mg/dL Final   Creatinine, Ser 10/07/2023 0.77  0.61 - 1.24 mg/dL Final   Calcium 24/40/1027 8.9  8.9 - 10.3 mg/dL Final   Total Protein 25/36/6440 8.5 (H)  6.5 - 8.1 g/dL Final   Albumin 34/74/2595 4.0  3.5 - 5.0 g/dL Final   AST 63/87/5643 29  15 - 41 U/L Final   ALT  10/07/2023 35  0 - 44 U/L Final   Alkaline Phosphatase 10/07/2023 89  38 - 126 U/L Final   Total Bilirubin 10/07/2023 0.5  <1.2 mg/dL Final   GFR, Estimated 10/07/2023 >60  >60 mL/min Final   Comment: (NOTE) Calculated using the CKD-EPI Creatinine Equation (2021)    Anion gap 10/07/2023 16 (H)  5 - 15 Final   Performed at [facility], [address], [place], [place] [address]   Alcohol, Ethyl (B) 10/07/2023 60 (H)  <10 mg/dL Final   Comment: (NOTE) Lowest detectable limit for serum alcohol is 10 mg/dL.  For medical purposes only. Performed at [facility], [address] [place], [place] [address]    Opiates 10/07/2023 NONE DETECTED  NONE DETECTED Final   Cocaine 10/07/2023 NONE DETECTED  NONE DETECTED Final   Benzodiazepines 10/07/2023 POSITIVE (A)  NONE DETECTED Final   Amphetamines 10/07/2023 NONE DETECTED  NONE DETECTED Final   Tetrahydrocannabinol 10/07/2023 NONE DETECTED  NONE DETECTED Final   Barbiturates 10/07/2023 NONE DETECTED  NONE DETECTED Final   Comment: (NOTE) DRUG SCREEN FOR MEDICAL PURPOSES ONLY.  IF CONFIRMATION IS NEEDED FOR ANY PURPOSE, NOTIFY LAB WITHIN 5 DAYS.  LOWEST DETECTABLE LIMITS FOR URINE DRUG SCREEN Drug Class                     Cutoff (ng/mL) Amphetamine and metabolites    1000 Barbiturate and metabolites    200 Benzodiazepine                 200 Opiates and metabolites        300 Cocaine and metabolites        300 THC                            50 Performed at [facility], [address], [place], [place] [address]    WBC 10/07/2023 9.8  4.0 - 10.5 K/uL Final   RBC 10/07/2023 4.90  4.22 - 5.81 MIL/uL Final   Hemoglobin 10/07/2023 14.4  13.0 - 17.0 g/dL Final   HCT 16/11/930 43.1  39.0 - 52.0 % Final   MCV 10/07/2023 88.0  80.0 - 100.0 fL Final   MCH 10/07/2023 29.4  26.0 - 34.0 pg Final   MCHC 10/07/2023 33.4  30.0 - 36.0 g/dL Final   RDW 35/57/3220 15.1  11.5 - 15.5 % Final    Platelets 10/07/2023 414 (H)  150 - 400 K/uL Final   nRBC 10/07/2023 0.0  0.0 - 0.2 % Final   Neutrophils Relative % 10/07/2023 65  % Final   Neutro Abs 10/07/2023 6.3  1.7 - 7.7 K/uL Final   Lymphocytes Relative 10/07/2023 27  % Final   Lymphs Abs 10/07/2023 2.7  0.7 - 4.0  K/uL Final   Monocytes Relative 10/07/2023 6  % Final   Monocytes Absolute 10/07/2023 0.6  0.1 - 1.0 K/uL Final   Eosinophils Relative 10/07/2023 2  % Final   Eosinophils Absolute 10/07/2023 0.2  0.0 - 0.5 K/uL Final   Basophils Relative 10/07/2023 0  % Final   Basophils Absolute 10/07/2023 0.0  0.0 - 0.1 K/uL Final   Immature Granulocytes 10/07/2023 0  % Final   Abs Immature Granulocytes 10/07/2023 0.03  0.00 - 0.07 K/uL Final   Performed at [facility], [address], [place], [place] [address]   Acetaminophen (Tylenol), Serum 10/07/2023 <10 (L)  10 - 30 ug/mL Final   Comment: (NOTE) Therapeutic concentrations vary significantly. A range of 10-30 ug/mL  may be an effective concentration for many patients. However, some  are best treated at concentrations outside of this range. Acetaminophen concentrations >150 ug/mL at 4 hours after ingestion  and >50 ug/mL at 12 hours after ingestion are often associated with  toxic reactions.  Performed at [facility], [address], [place], [place] [address]    Salicylate Lvl 10/07/2023 <7.0 (L)  7.0 - 30.0 mg/dL Final   Performed at [facility], [address], [place], [place] [address]   Blood Alcohol level:  Lab Results  Component Value Date   ETH 60 (H) 10/07/2023   ETH <10 05/10/2021   Metabolic Disorder Labs: Lab Results  Component Value Date   HGBA1C 5.9 (H) 10/10/2023   MPG 122.63 10/10/2023   No results found for: "PROLACTIN" Lab Results  Component Value Date   CHOL 172 10/07/2023   TRIG 268 (H) 10/07/2023   HDL 37 (L) 10/07/2023   CHOLHDL 4.6 10/07/2023   VLDL 54 (H) 10/07/2023    LDLCALC 81 10/07/2023   Therapeutic Lab Levels: No results found for: "LITHIUM" No results found for: "VALPROATE" No results found for: "CBMZ" Physical Findings   AIMS    Flowsheet Row [facility]) [facility]) [facility]  AIMS Total Score 0 0      AUDIT    Flowsheet Row [facility]) [facility] Ad[facility]) [facility]  Alcohol Use Disorder Identification Test Final Score (AUDIT) 30 27      PHQ2-9    Flowsheet Row ED from 10/07/2023 in [facility]  PHQ-2 Total Score 3  PHQ-9 Total Score 10      Flowsheet Row ED from 10/07/2023 in [facility] Most recent reading at 10/07/2023 11:49 PM ED from 10/07/2023 in [facility] at [facility] Most recent reading at 10/07/2023  3:58 PM ED from 07/07/2021 in [facility] at [facility] Most recent reading at 07/07/2021  4:06 PM  C-SSRS RISK CATEGORY High Risk High Risk No Risk       Musculoskeletal  Strength & Muscle Tone: within normal limits Gait & Station: normal Patient leans: N/A   Strength & Muscle Tone: within normal limits Gait & Station: normal Patient leans: N/A  Psychiatric Specialty Exam   Presentation  General Appearance:Appropriate for Environment Eye Contact:Good Speech:Clear and Coherent Volume:Normal Handedness:Right  Mood and Affect  Mood:Anxious Affect:Congruent  Thought Process  Thought Process:Coherent Descriptions of Associations:Intact  Thought Content Suicidal Thoughts:Suicidal Thoughts: No Homicidal Thoughts:Homicidal Thoughts: No Hallucinations:Hallucinations:  None Ideas of Reference:None Thought  Content:Logical  Sensorium  Memory:Immediate Good, Recent Good, Remote Good Judgment:Intact Insight:Shallow  Executive Functions  Orientation:Full (Time, Place and Person) Language:Fair Concentration:Fair Attention:Fair Recall:Fair Fund of Knowledge:Fair  Psychomotor Activity  Psychomotor Activity:Psychomotor Activity: Normal  Assets  Assets:Communication Skills, Desire for Improvement, Social Support  Sleep  Quality:Fair  Physical Exam and Labs  BP 109/71 (BP Location: Right Arm)   Pulse 71   Temp 98.3 F (36.8 C) (Oral)   Resp 16   SpO2 98%  Physical Exam Vitals reviewed.  Constitutional:      General: He is not in acute distress. HENT:     Head: Normocephalic and atraumatic.  Pulmonary:     Effort: Pulmonary effort is normal.  Skin:    General: Skin is warm and dry.  Neurological:     General: No focal deficit present.     Mental Status: He is alert and oriented to person, place, and time.     Motor: No weakness.     Gait: Gait normal.    EKG: normal sinus rhythm, nonspecific T waves changes, QTc 435.   Assessment / Plan  Total Time spent with patient: 1 hour Treatment Plan Summary: Daily contact with patient to assess and evaluate symptoms and progress in treatment and Medication management  Principal Problem:   Alcohol dependence (HCC)  Ken Cambray [name] is a 47 y.o. male with  history of alcohol use disorder with recent relapse after 8 months leading to alcohol-induced depression and anxiety. Patient motivated to achieve sobriety and would like to pursue residential treatment at this time. He would like for mood symptoms to be addressed and is advised that alcohol cessation would improve his symptoms significantly, but in the interim, may aid in depressive symptoms. Zoloft and Naltrexone r/b/ae discussed with patient, and he is agreeable to a trial. Patient denies hx of opiate use, UDS pos for BZD (received in ED), and LFTs WNL. Will proceed with  Naltrexone at this time. He is working on finding placement after his stay at this facility  #Alcohol Use Disorder - last drink 11/16 #Alcohol-Induced Depression and Anxiety CIWA Ativan protocol initiated -CIWAs 0, d/c CIWA and PRN ativan. No withdrawal symptoms -lorazepam (Ativan) 1 mg every 6 hours as needed for CIWA greater than 10; Hydroxyzine 25 mg for CIWA less than 10 -Multivitamin with minerals 1 tablet daily -Ondansetron disintegrating tablet 4 mg every 6 as needed/nausea or vomiting -Loperamide 2 to 4 mg oral as needed/diarrhea or loose stools -Thiamine injection 100 mg IM once followed by Thiamine tablet 100 mg daily. -- Continue Zoloft 50 mg for depressive and anxiety sx -- Continue Naltrexone 50 mg at bedtime for alcohol use disorder.   #Hist of HTN --24 hour BP 110-122/75-83; MR 120/80. Patient reports home medication Lisinopril (unsure of medication and dosage).  -As patient normotensive, will refrain from starting at this time.   #Hist of T2DM --A1c 5.9.  -restart home metformin 1000mg  daily  #Hypertriglyceridemia -- TG 268 this admission.  Likely elevated in the setting of chronic alcohol use, obesity, and other factors.  - restart home lipitor -- Outpatient PCP follow-up.  Considerations for follow-up: Recommend monitoring LFT every 6-12 months while on naltrexone  DISPO: Patient has interview with [facility] today. Back-up plan is intake assessment at [facility] on Monday Tentative date: Friday Location: TBD  Signed: Karie Fetch, MD, PGY-2 10/12/2023, 9:34 AM   [facility] [address] [place], [place] [address] Dept:  [phone] Dept Fax: [phone]

## 2023-10-12 NOTE — ED Notes (Signed)
 Patient was provided dinner

## 2023-10-12 NOTE — Care Management (Addendum)
[facility]   12:20pm  Patient reports that he has declined the placement at the [facility].  Patient reports that he wants to follow up with a apartment with the [facility].   Writer informed the MD and the RN working with the patient.  Per the MD, the patient will be discharging tomorrow.   12pm  Patient was accepted to the [facility], per intake worker.  Patient will be able to go to the facility tomorrow at [facility]  [address]  [place]  [phone]

## 2023-10-12 NOTE — ED Notes (Signed)
Patient sitting in dayroom interacting with peers. No acute distress noted. No concerns voiced. Informed patient to notify staff with any needs or assistance. Patient verbalized understanding or agreement. Safety checks in place per facility policy.

## 2023-10-12 NOTE — ED Notes (Signed)
Patient is sleeping. Respirations equal and unlabored, skin warm and dry. No change in assessment or acuity. Routine safety checks conducted according to facility protocol. Will continue to monitor for safety.   

## 2023-10-12 NOTE — Group Note (Signed)
Group Topic: Positive Affirmations  Group Date: 10/12/2023 Start Time: 1115 End Time: 1200 Facilitators: Cassandria Anger  Department: [facility]  Number of Participants: 3  Group Focus: affirmation Treatment Modality:  Psychoeducation Interventions utilized were patient education Purpose: increase insight  Name: Aaron [name] Date of Birth: 1976-03-27  MR: [id number]    Level of Participation: active Quality of Participation: attentive, cooperative, engaged, and offered feedback Interactions with others: gave feedback Mood/Affect: appropriate, bright, and positive Triggers (if applicable): N/A Cognition: coherent/clear, concrete, and insightful Progress: Moderate Response: Patient expressed his plan for the future. Patient is confident is his ability to recover with his family support. Patient states he is motivated and grasps what is needed to be done in order to fully recover.  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

## 2023-10-12 NOTE — Group Note (Signed)
Group Topic: Balance in Life  Group Date: 10/12/2023 Start Time: 0730 End Time: 0800 Facilitators: Hilma Favors, RN  Department: [facility]  Number of Participants: 4  Group Focus: personal responsibility Treatment Modality:  Family Therapy Interventions utilized were clarification Purpose: enhance coping skills  Name: Aaron [name] Date of Birth: 24-Jun-1976  MR: [id number]    Level of Participation: active Quality of Participation: cooperative Interactions with others: gave feedback Mood/Affect: appropriate Triggers (if applicable): n/a Cognition: coherent/clear Progress: Moderate Response: n/a Plan: patient will be encouraged to attend.  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

## 2023-10-12 NOTE — ED Notes (Signed)
Patient alert & oriented x4. Denies intent to harm self or others when asked. Denies A/VH. Patient reported dry skin and requested lotion or Vaseline. Vaseline given. No further complaints at this time No acute distress noted. Support and encouragement provided. Routine safety checks conducted per facility protocol. Encouraged patient to notify staff if any thoughts of harm towards self or others arise. Patient verbalizes understanding and agreement.

## 2023-10-12 NOTE — ED Notes (Signed)
Pt is in dayroom calm and cooperative no c/o pain or distress alert and orient x 4 pt states he has been accepted to [facility] will continue to monitor for safety

## 2023-10-12 NOTE — Group Note (Signed)
Group Topic: Wellness  Group Date: 10/12/2023 Start Time: 1200 End Time: 1215 Facilitators: Claudett Bayly, Jacklynn Barnacle, RN  Department: [facility]  Number of Participants: 4  Group Focus: nutrition Treatment Modality:  Patient-Centered Therapy Interventions utilized were exploration and patient education Purpose: increase insight  Name: Aaron [name] Date of Birth: 08/27/1976  MR: [id number]    Level of Participation: moderate Quality of Participation: attentive, engaged, initiates communication, and motivated Interactions with others: asked questions, engaged with program Mood/Affect: appropriate and brightens with interaction Triggers (if applicable): n/a Cognition: coherent/clear and logical Progress: Gaining insight Response: Patient gain knowledge related to healthy dietary choices Plan: patient will be encouraged to consider dietary options and consider healthy choices  Patients Problems:  Patient Active Problem List   Diagnosis Date Noted   Alcohol abuse    Suicidal ideation    Alcohol withdrawal without perceptual disturbances (HCC) 04/08/2021   Alcohol dependence (HCC) 04/08/2021   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

## 2023-10-13 DIAGNOSIS — F1090 Alcohol use, unspecified, uncomplicated: Secondary | ICD-10-CM | POA: Diagnosis not present

## 2023-10-13 MED ORDER — NALTREXONE HCL 50 MG PO TABS: 50.0000 mg | ORAL_TABLET | Freq: Every day | ORAL | 0 refills | Status: AC

## 2023-10-13 MED ORDER — METFORMIN HCL ER (OSM) 1000 MG PO TB24: 1000.0000 mg | ORAL_TABLET | Freq: Every day | ORAL | 0 refills | Status: DC

## 2023-10-13 MED ORDER — SERTRALINE HCL 50 MG PO TABS: 50.0000 mg | ORAL_TABLET | Freq: Every day | ORAL | 0 refills | Status: DC

## 2023-10-13 MED ORDER — ATORVASTATIN CALCIUM 40 MG PO TABS: 40.0000 mg | ORAL_TABLET | Freq: Every day | ORAL | 0 refills | Status: DC

## 2023-10-13 NOTE — ED Notes (Signed)
Patient is awake and alert on unit.  Patient is in dayroom eating breakfast and watching tv.  He is scheduled for discharge later this morning.

## 2023-10-13 NOTE — ED Notes (Signed)
Patient is sleeping. Respirations equal and unlabored, skin warm and dry. No change in assessment or acuity. Routine safety checks conducted according to facility protocol. Will continue to monitor for safety.   

## 2023-10-13 NOTE — ED Provider Notes (Addendum)
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 Patient is seen this AM in the treatment team room. He reports feeling good and is excited about going to the [employer] in [place]. He reports his plan is to work and go to the [employer] and attend [employer] He denies withdrawal symptoms. He denies SI/HI/AVH.   Stay Summary:  During the patient's hospitalization, patient had extensive initial psychiatric evaluation, and follow-up psychiatric evaluations every day.  Psychiatric diagnoses provided upon initial assessment:  -Alcohol Use Disorder - last drink 11/16  -Substance-induced mood disorder -Tobacco use disorder  Patient's psychiatric medications were adjusted on admission:  -started zoloft 50mg  for depressive and anxiety symptoms  -started naltrexone 25mg  for alcohol use disorder -CIWA with ativan taper  During the hospitalization, other adjustments were made to the patient's psychiatric medication regimen:  -increased naltrexone 50mg  daily for alcohol use disorder -d/c'ed ativan taper, kept PRN ativan. Patient did not require any additional PRN ativan.   Patient's care was discussed during the interdisciplinary team meeting every day during the hospitalization.  The patient denied having side effects to prescribed psychiatric medication.  Gradually, patient started adjusting to milieu. The patient was evaluated each day by a clinical provider to ascertain response to treatment. Improvement was noted by the patient's report of decreasing symptoms, improved sleep and appetite, affect, medication tolerance, behavior, and participation in unit  programming.  Patient was asked each day to complete a self inventory noting mood, mental status, pain, new symptoms, anxiety and concerns.    Symptoms were reported as significantly decreased or resolved completely by discharge.   On day of discharge, the patient reports that their mood is stable. The patient denied having suicidal thoughts for more than 48 hours prior to discharge.  Patient denies having homicidal thoughts.  Patient denies having auditory hallucinations.  Patient denies any visual hallucinations or other symptoms of psychosis. The patient was motivated to continue taking medication with a goal of continued improvement in mental health.   Supportive psychotherapy was provided to the patient. The patient also participated in regular group therapy while hospitalized. Coping skills, problem solving as well as relaxation therapies were also part of the unit programming.  Labs were reviewed with the patient, and abnormal results were discussed with the patient.  The patient is able to verbalize their individual safety plan to this provider.  # It is recommended to the patient to continue psychiatric medications as prescribed, after discharge from the hospital.    # It is recommended to the patient to follow up with your outpatient psychiatric provider and PCP.  # It was discussed with the patient, the impact of alcohol, drugs, tobacco have been there overall psychiatric and medical wellbeing, and total abstinence from substance use was recommended the patient.ed.  # Prescriptions provided or sent directly to preferred pharmacy at discharge. Patient agreeable to plan. Given opportunity to ask questions. Appears to feel comfortable with discharge.    # In the event of worsening symptoms, the patient is instructed to call the crisis hotline, 911 and or go to the nearest ED for appropriate evaluation and treatment of symptoms. To follow-up with primary care provider  for other medical  issues, concerns and or health care needs  # Patient was discharged [facility] with a plan to follow up as noted below.   Past Psychiatric History:  Dx: Depression, alcohol use disorder Prior Rx: Maybe sertraline OP psychiatrist: Denies OP therapist: Denies PCP: Patient, No Pcp Per  Suicide Attempt: Denies Inpatient psych: Yes, 08/2023 in [place], [place]  Past Medical History:  DM, HTN   Family History: Unsure  Social History:  Living: In hotel since returning to [facility] on Sunday night Income: None currently Trauma: Denies Social support: Mother and other family members   EtOH:  reports current alcohol use.  Tobacco:  reports that he has been smoking cigarettes. He has never used smokeless tobacco.  Cannabis: Denies Opiates: Denies Stimulants: Denies BZO/hypnotics: Denies Seizure/DT: Denies  Treatments: Denies, but chart shows [facility] IVDU: Denies  Tobacco Cessation:  A prescription for an FDA-approved tobacco cessation medication was offered at discharge and the patient refused  Current Medications:  Current Facility-Administered Medications  Medication Dose Route Frequency Provider Last Rate Last Admin   acetaminophen (TYLENOL) tablet 650 mg  650 mg Oral Q6H PRN Bobbitt, Shalon E, NP       alum & mag hydroxide-simeth (MAALOX/MYLANTA) 200-200-20 MG/5ML suspension 30 mL  30 mL Oral Q4H PRN Bobbitt, Shalon E, NP   30 mL at 10/11/23 1420   atorvastatin (LIPITOR) tablet 40 mg  40 mg Oral Daily Karie Fetch, MD   40 mg at 10/12/23 0930   benzonatate (TESSALON) capsule 100 mg  100 mg Oral TID PRN Lamar Sprinkles, MD   100 mg at 10/09/23 1731   magnesium hydroxide (MILK OF MAGNESIA) suspension 30 mL  30 mL Oral Daily PRN Bobbitt, Shalon E, NP       metFORMIN (GLUCOPHAGE-XR) 24 hr tablet 1,000 mg  1,000 mg Oral Q breakfast Karie Fetch, MD   1,000 mg at 10/12/23 0848   multivitamin with minerals tablet 1 tablet  1 tablet Oral Daily Bobbitt, Shalon E, NP   1  tablet at 10/12/23 0930   naltrexone (DEPADE) tablet 50 mg  50 mg Oral QHS Lamar Sprinkles, MD   50 mg at 10/12/23 2125   sertraline (ZOLOFT) tablet 50 mg  50 mg Oral Daily Lamar Sprinkles, MD   50 mg at 10/12/23 0930   thiamine (VITAMIN B1) injection 100 mg  100 mg Intramuscular Once Bobbitt, Shalon E, NP       thiamine (VITAMIN B1) tablet 100 mg  100 mg Oral Daily Bobbitt, Shalon E, NP   100 mg at 10/12/23 0930   traZODone (DESYREL) tablet 50 mg  50 mg Oral QHS PRN Bobbitt, Shalon E, NP   50 mg at 10/12/23 2125   Current Outpatient Medications  Medication Sig Dispense Refill   atorvastatin (LIPITOR) 40 MG tablet Take 1 tablet (40 mg total) by mouth daily. 30 tablet 0   metFORMIN (FORTAMET) 1000 MG (OSM) 24 hr tablet Take 1 tablet (1,000 mg total) by mouth daily with breakfast. 30 tablet 0   naltrexone (DEPADE) 50 MG tablet Take 1 tablet (50 mg total) by mouth at bedtime. 30 tablet 0   sertraline (ZOLOFT) 50 MG tablet Take 1 tablet (50 mg total) by mouth daily. 30 tablet 0    PTA Medications:  Facility Ordered Medications  Medication   [COMPLETED] potassium chloride SA (KLOR-CON M) CR tablet 40 mEq   acetaminophen (TYLENOL) tablet 650 mg   alum & mag hydroxide-simeth (MAALOX/MYLANTA) 200-200-20 MG/5ML suspension  30 mL   magnesium hydroxide (MILK OF MAGNESIA) suspension 30 mL   traZODone (DESYREL) tablet 50 mg   thiamine (VITAMIN B1) injection 100 mg   thiamine (VITAMIN B1) tablet 100 mg   multivitamin with minerals tablet 1 tablet   [EXPIRED] LORazepam (ATIVAN) tablet 1 mg   [EXPIRED] hydrOXYzine (ATARAX) tablet 25 mg   [EXPIRED] loperamide (IMODIUM) capsule 2-4 mg   [EXPIRED] ondansetron (ZOFRAN-ODT) disintegrating tablet 4 mg   [EXPIRED] LORazepam (ATIVAN) tablet 1 mg   [COMPLETED] naltrexone (DEPADE) tablet 25 mg   Followed by   naltrexone (DEPADE) tablet 50 mg   sertraline (ZOLOFT) tablet 50 mg   benzonatate (TESSALON) capsule 100 mg   atorvastatin (LIPITOR) tablet 40 mg    metFORMIN (GLUCOPHAGE-XR) 24 hr tablet 1,000 mg   PTA Medications  Medication Sig   atorvastatin (LIPITOR) 40 MG tablet Take 1 tablet (40 mg total) by mouth daily.   sertraline (ZOLOFT) 50 MG tablet Take 1 tablet (50 mg total) by mouth daily.   metFORMIN (FORTAMET) 1000 MG (OSM) 24 hr tablet Take 1 tablet (1,000 mg total) by mouth daily with breakfast.   naltrexone (DEPADE) 50 MG tablet Take 1 tablet (50 mg total) by mouth at bedtime.       10/10/2023   11:37 AM 10/08/2023    2:10 AM  Depression screen PHQ 2/9  Decreased Interest 1 1  Down, Depressed, Hopeless 2 1  PHQ - 2 Score 3 2  Altered sleeping 1 1  Tired, decreased energy 1 1  Change in appetite 1 1  Feeling bad or failure about yourself  2 1  Trouble concentrating 1 1  Moving slowly or fidgety/restless 0 0  Suicidal thoughts 1 0  PHQ-9 Score 10 7  Difficult doing work/chores  Somewhat difficult    Flowsheet Row ED from 10/07/2023 in [facility] Most recent reading at 10/07/2023 11:49 PM ED from 10/07/2023 in [facility] Emergency Department at [facility] Most recent reading at 10/07/2023  3:58 PM ED from 07/07/2021 in [facility] Emergency Department at [facility] Most recent reading at 07/07/2021  4:06 PM  C-SSRS RISK CATEGORY High Risk High Risk No Risk       Musculoskeletal  Strength & Muscle Tone: within normal limits Gait & Station: normal Patient leans: N/A  Psychiatric Specialty Exam  Presentation  General Appearance:  Appropriate for Environment  Eye Contact: Good  Speech: Clear and Coherent  Speech Volume: Normal  Handedness: Right   Mood and Affect  Mood: Excited to go   Affect: Congruent   Thought Process  Thought Processes: Coherent  Descriptions of Associations:Intact  Orientation:Full (Time, Place and Person)  Thought Content:Logical  Diagnosis of Schizophrenia or Schizoaffective disorder in past: No    Hallucinations:  None Ideas of Reference:None  Suicidal Thoughts: None Homicidal Thoughts: None  Sensorium  Memory: Immediate Good; Recent Good; Remote Good  Judgment: Intact  Insight: Improving   Executive Functions  Concentration: Fair  Attention Span: Fair  Recall: [employer] of Knowledge: Fair  Language: Fair   Psychomotor Activity  Psychomotor Activity: Normal  Assets  Assets: Manufacturing systems engineer; Desire for Improvement; Social Support   Sleep  Sleep: Good  Physical Exam  Constitutional:      Appearance: the patient is not toxic-appearing.  Pulmonary:     Effort: Pulmonary effort is normal.  Neurological:     General: No focal deficit present.     Mental Status: the patient is  alert and oriented to person, place, and time.   Review of Systems  Respiratory:  Negative for shortness of breath.   Cardiovascular:  Negative for chest pain.  Gastrointestinal:  Negative for abdominal pain, constipation, diarrhea, nausea and vomiting.  Neurological:  Negative for headaches.   Blood pressure (!) 122/90, pulse 62, temperature 98.3 F (36.8 C), temperature source Oral, resp. rate 18, SpO2 99%. There is no height or weight on file to calculate BMI.  Demographic Factors:  Male, Low socioeconomic status, and Unemployed  Loss Factors: Decrease in vocational status and Financial problems/change in socioeconomic status  Historical Factors: Impulsivity  Risk Reduction Factors:   Living with another person, especially a relative  Continued Clinical Symptoms:  Depression Alcohol/Substance Abuse/Dependencies  Cognitive Features That Contribute To Risk:  Thought constriction (tunnel vision)    Suicide Risk:  Mild: There are no identifiable plans, no associated intent, mild dysphoria and related symptoms, good self-control (both objective and subjective assessment), few other risk factors, and identifiable protective factors, including available and accessible social  support.  Plan Of Care/Follow-up recommendations:  Activity: as tolerated  Diet: heart healthy  Other: -Follow-up with an outpatient psychiatric provider   -Take your psychiatric medications as prescribed at discharge - instructions are provided to you in the discharge paperwork  -Follow-up with outpatient primary care doctor and other specialists -for management of preventative medicine and chronic medical disease -diabetes, HTN, hyperlipidemia  -Testing: Follow-up with outpatient provider for abnormal lab results:  -A1c 5.9, TG 268  -If you are prescribed an atypical antipsychotic medication, we recommend that your outpatient psychiatrist follow routine screening for side effects within 3 months of discharge, including monitoring: AIMS scale, height, weight, blood pressure, fasting lipid panel, HbA1c, and fasting blood sugar.   -Recommend total abstinence from alcohol, tobacco, and other illicit drug use at discharge.   -If your psychiatric symptoms recur, worsen, or if you have side effects to your psychiatric medications, call your outpatient psychiatric provider, 911, [phone] or go to the nearest emergency department.  -If suicidal thoughts occur, immediately call your outpatient psychiatric provider, 911, [phone] or go to the nearest emergency department.   Disposition: [facility]  Karie Fetch, MD, PGY-2 10/13/2023, 8:12 AM

## 2023-11-23 ENCOUNTER — Other Ambulatory Visit: Payer: Self-pay

## 2023-11-23 ENCOUNTER — Emergency Department (HOSPITAL_COMMUNITY): Payer: Self-pay

## 2023-11-23 ENCOUNTER — Emergency Department (HOSPITAL_COMMUNITY)
Admission: EM | Admit: 2023-11-23 | Discharge: 2023-11-23 | Disposition: A | Discharge status: Home / Self Care | Payer: Self-pay | Attending: Emergency Medicine | Admitting: Emergency Medicine

## 2023-11-23 DIAGNOSIS — Z760 Encounter for issue of repeat prescription: Secondary | ICD-10-CM | POA: Insufficient documentation

## 2023-11-23 DIAGNOSIS — R2232 Localized swelling, mass and lump, left upper limb: Secondary | ICD-10-CM | POA: Insufficient documentation

## 2023-11-23 DIAGNOSIS — E119 Type 2 diabetes mellitus without complications: Secondary | ICD-10-CM | POA: Insufficient documentation

## 2023-11-23 DIAGNOSIS — M7989 Other specified soft tissue disorders: Secondary | ICD-10-CM

## 2023-11-23 DIAGNOSIS — Z7984 Long term (current) use of oral hypoglycemic drugs: Secondary | ICD-10-CM | POA: Insufficient documentation

## 2023-11-23 LAB — CBG MONITORING, ED: Glucose-Capillary: 125 mg/dL — ABNORMAL HIGH (ref 70–99)

## 2023-11-23 MED ORDER — METFORMIN HCL 500 MG PO TABS: 500.0000 mg | ORAL_TABLET | Freq: Every day | ORAL | 1 refills | Status: DC

## 2023-11-23 MED ORDER — SIMVASTATIN 10 MG PO TABS: 10.0000 mg | ORAL_TABLET | Freq: Every day | ORAL | 1 refills | Status: DC

## 2023-11-23 NOTE — ED Triage Notes (Signed)
 Pt arrived via POV. C/o L hand pain and swelling for 1 wk, and being out of their diabetes and cholesterol meds.

## 2023-11-23 NOTE — Discharge Instructions (Signed)
 Your x-ray was normal.  This is likely an overuse injury like we discussed.  You can take 600 mg of ibuprofen every 6 hours as needed for pain for the next week.  I have sent the medication to your pharmacy.

## 2023-11-23 NOTE — ED Provider Notes (Signed)
 [place] EMERGENCY DEPARTMENT AT [facility] Provider Note   CSN: [id number] Arrival date & time: 11/23/23  1014     History Chief Complaint  Patient presents with   Medication Refill   Hand Injury    Aaron [name] is a 48 y.o. male with history of diabetes and hyperlipidemia who presents to the emergency department today for medication refill and left hand swelling.  [place] and has not yet established care with a primary care physician.  Looking for a refill for his metformin  and simvastatin .  In addition, patient complaining of left hand swelling and pain.  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  He denies any fever, chills, chest pain, shortness of breath, abdominal pain.   Medication Refill Hand Injury      Home Medications Prior to Admission medications   Medication Sig Start Date End Date Taking? Authorizing Provider  metFORMIN  (GLUCOPHAGE ) 500 MG tablet Take 1 tablet (500 mg total) by mouth daily. 11/23/23  Yes Theotis, Lafern Brinkley M, PA-C  simvastatin  (ZOCOR ) 10 MG tablet Take 1 tablet (10 mg total) by mouth daily. 11/23/23  Yes Theotis, Yeni Jiggetts M, PA-C  sertraline  (ZOLOFT ) 50 MG tablet Take 1 tablet (50 mg total) by mouth daily. 10/13/23 11/12/23  Graham Krabbe, MD      Allergies    Patient has no known allergies.    Review of Systems   Review of Systems  All other systems reviewed and are negative.   Physical Exam Updated Vital Signs BP 115/68   Pulse 63   Temp 97.9 F (36.6 C) (Oral)   Resp 16   Ht 5' 2 (1.575 m)   Wt 75.3 kg   SpO2 98%   BMI 30.36 kg/m  Physical Exam Vitals and nursing note reviewed.  Constitutional:      Appearance: Normal appearance.  HENT:     Head: Normocephalic and atraumatic.  Eyes:     General:        Right eye: No discharge.        Left eye: No discharge.     Conjunctiva/sclera: Conjunctivae normal.   Pulmonary:     Effort: Pulmonary effort is normal.  Musculoskeletal:     Comments: No specific point tenderness.  Minimal swelling diffusely.  2+ radial pulses felt in both wrists.  Good cap refill.  Sensation is intact.  Skin:    General: Skin is warm and dry.     Findings: No rash.  Neurological:     General: No focal deficit present.     Mental Status: He is alert.  Psychiatric:        Mood and Affect: Mood normal.        Behavior: Behavior normal.     ED Results / Procedures / Treatments   Labs (all labs ordered are listed, but only abnormal results are displayed) Labs Reviewed  CBG MONITORING, ED - Abnormal; Notable for the following components:      Result Value   Glucose-Capillary 125 (*)    All other components within normal limits    EKG None  Radiology DG Hand Complete Left Result Date: 11/23/2023 CLINICAL DATA:  Injury to the left hand at work after heavy box fell on top of the hand one-week ago. Pain and swelling involving the second and third digits. EXAM: LEFT HAND - COMPLETE 3 VIEW COMPARISON:  None Available. FINDINGS: There is no evidence of fracture or dislocation. Well corticated radiodensity projecting along the ulnar aspect of the index finger proximal interphalangeal joint. Degenerative changes of the index and middle finger distal interphalangeal joints. Mild swelling of the index and middle fingers. IMPRESSION: 1. No acute fracture or dislocation. 2. Well corticated radiodensity projecting along the ulnar aspect of the index finger proximal interphalangeal joint, likely sequela of remote injury. Electronically Signed   By: Limin  Xu M.D.   On: 11/23/2023 14:46    Procedures Procedures    Medications Ordered in ED Medications - No data to display  ED Course/ Medical Decision Making/ A&P   {   Click here for ABCD2, HEART and other calculators  Medical Decision Making Aaron [name] is a 48 y.o. male patient who presents to the emergency  department today for further evaluation of left hand swelling and pain and medication refills.  I will send the medication refills to his desired pharmacy.  Imaging was obtained in triage interpreted by myself.  I do not see any evidence of acute fracture or significant trauma to the hand.  I do agree with the radiologist interpretation.  Patient safe for discharge.   Amount and/or Complexity of Data Reviewed Radiology: ordered.  Risk Prescription drug management.    Final Clinical Impression(s) / ED Diagnoses Final diagnoses:  Hand swelling  Medication refill    Rx / DC Orders ED Discharge Orders          Ordered    simvastatin  (ZOCOR ) 10 MG tablet  Daily        11/23/23 1243    metFORMIN  (GLUCOPHAGE ) 500 MG tablet  Daily        11/23/23 1243              Theotis Peers [place], PA-C 11/23/23 1500    Elnor Jayson LABOR, DO 11/23/23 1743

## 2024-04-11 ENCOUNTER — Emergency Department (HOSPITAL_COMMUNITY): Admission: EM | Admit: 2024-04-11 | Discharge: 2024-04-12 | Discharge status: Against Medical Advice | Payer: Self-pay

## 2024-04-11 NOTE — ED Notes (Signed)
 Called for triage x3 3 different times and no anweser in lobby

## 2024-04-20 ENCOUNTER — Encounter (HOSPITAL_COMMUNITY): Payer: Self-pay | Admitting: Emergency Medicine

## 2024-04-20 ENCOUNTER — Emergency Department (HOSPITAL_COMMUNITY)
Admission: EM | Admit: 2024-04-20 | Discharge: 2024-04-21 | Disposition: A | Discharge status: Home / Self Care | Payer: Self-pay | Attending: Emergency Medicine | Admitting: Emergency Medicine

## 2024-04-20 DIAGNOSIS — F1014 Alcohol abuse with alcohol-induced mood disorder: Secondary | ICD-10-CM | POA: Insufficient documentation

## 2024-04-20 DIAGNOSIS — F10920 Alcohol use, unspecified with intoxication, uncomplicated: Secondary | ICD-10-CM

## 2024-04-20 DIAGNOSIS — Y906 Blood alcohol level of 120-199 mg/100 ml: Secondary | ICD-10-CM | POA: Insufficient documentation

## 2024-04-20 DIAGNOSIS — F4325 Adjustment disorder with mixed disturbance of emotions and conduct: Secondary | ICD-10-CM | POA: Insufficient documentation

## 2024-04-20 DIAGNOSIS — R4689 Other symptoms and signs involving appearance and behavior: Secondary | ICD-10-CM

## 2024-04-20 DIAGNOSIS — F430 Acute stress reaction: Secondary | ICD-10-CM

## 2024-04-20 DIAGNOSIS — F10120 Alcohol abuse with intoxication, uncomplicated: Secondary | ICD-10-CM | POA: Insufficient documentation

## 2024-04-20 LAB — CBC
HCT: 44.6 % (ref 39.0–52.0)
Hemoglobin: 14.2 g/dL (ref 13.0–17.0)
MCH: 28.3 pg (ref 26.0–34.0)
MCHC: 31.8 g/dL (ref 30.0–36.0)
MCV: 88.8 fL (ref 80.0–100.0)
Platelets: 417 10*3/uL — ABNORMAL HIGH (ref 150–400)
RBC: 5.02 MIL/uL (ref 4.22–5.81)
RDW: 15.1 % (ref 11.5–15.5)
WBC: 8.7 10*3/uL (ref 4.0–10.5)
nRBC: 0 % (ref 0.0–0.2)

## 2024-04-20 LAB — BASIC METABOLIC PANEL WITH GFR
Anion gap: 13 (ref 5–15)
BUN: 14 mg/dL (ref 6–20)
CO2: 23 mmol/L (ref 22–32)
Calcium: 8.6 mg/dL — ABNORMAL LOW (ref 8.9–10.3)
Chloride: 101 mmol/L (ref 98–111)
Creatinine, Ser: 1.08 mg/dL (ref 0.61–1.24)
GFR, Estimated: >60 mL/min (ref >60)
Glucose, Bld: 100 mg/dL — ABNORMAL HIGH (ref 70–99)
Potassium: 3.2 mmol/L — ABNORMAL LOW (ref 3.5–5.1)
Sodium: 137 mmol/L (ref 135–145)

## 2024-04-20 LAB — RAPID URINE DRUG SCREEN, HOSP PERFORMED
Amphetamines: NOT DETECTED
Barbiturates: NOT DETECTED
Benzodiazepines: NOT DETECTED
Cocaine: NOT DETECTED
Opiates: NOT DETECTED
Tetrahydrocannabinol: NOT DETECTED

## 2024-04-20 LAB — ETHANOL: Alcohol, Ethyl (B): 130 mg/dL — ABNORMAL HIGH (ref <15)

## 2024-04-20 MED ORDER — POTASSIUM CHLORIDE CRYS ER 20 MEQ PO TBCR
40.0000 meq | EXTENDED_RELEASE_TABLET | Freq: Once | ORAL | Status: AC
  Administered 2024-04-20: 40 meq via ORAL
  Filled 2024-04-20: qty 2

## 2024-04-20 NOTE — ED Triage Notes (Addendum)
 Pt came via [employer]. He reports he is homicidal to people that live in halfway house with him. Suicidal. Been drinking today. States was sober for a month. Last drink was 20 mins ago. Pt is cooperative but appears intoxicated. Pt was picked up by [facility] at [facility] and [address] exit by [employer]. Pt told [employer] when he gets drinking, he gets "bad thoughts".

## 2024-04-20 NOTE — Discharge Instructions (Signed)
It was our pleasure to provide your ER care today - we hope that you feel better.  Avoid alcohol use as it is harmful to your physical health and mental well-being. See resource guide attached in terms of accessing inpatient or outpatient substance use treatment programs.   Follow up closely with primary care doctor and behavioral health provider in the coming week.  For mental health issues and/or crisis, you may also go directly to the [facility] - they are open 24/7 and walk-ins are welcome.    Return to ER if worse, new symptoms, fevers, chest pain, trouble breathing, or other emergency concern.   

## 2024-04-20 NOTE — ED Provider Notes (Signed)
 [place] EMERGENCY DEPARTMENT AT [facility] Provider Note   CSN: [id number] Arrival date & time: 04/20/24  2052     History  Chief Complaint  Patient presents with   Alcohol Intoxication    Aaron [name] is a 48 y.o. male.  Pt dropped off by [employer], pt indicating he is feeling upset, but is very difficult/poor historian, and will not say what he is upset about - level 5 caveat. Indicates got into disagreement with people where he is staying, [employer]. Does indicate etoh use today, but cannot quantify amount. Denies other medication or substance use. Denies specific physical complaint. Indicates normal appetite, normal po intake. Is able to sleep at night. Denies new/acute stressor.   The history is provided by the patient and medical records. The history is limited by the condition of the patient.  Alcohol Intoxication Pertinent negatives include no chest pain, no abdominal pain, no headaches and no shortness of breath.       Home Medications Prior to Admission medications   Medication Sig Start Date End Date Taking? Authorizing Provider  metFORMIN  (GLUCOPHAGE ) 500 MG tablet Take 1 tablet (500 mg total) by mouth daily. 11/23/23   Angelyn Kennel M, PA-C  sertraline  (ZOLOFT ) 50 MG tablet Take 1 tablet (50 mg total) by mouth daily. 10/13/23 11/12/23  Chien, Stephanie, MD  simvastatin  (ZOCOR ) 10 MG tablet Take 1 tablet (10 mg total) by mouth daily. 11/23/23   Darletta Ehrich, PA-C      Allergies    Patient has no known allergies.    Review of Systems   Review of Systems  Constitutional:  Negative for fever.  HENT:  Negative for sore throat.   Respiratory:  Negative for cough and shortness of breath.   Cardiovascular:  Negative for chest pain.  Gastrointestinal:  Negative for abdominal pain and vomiting.  Musculoskeletal:  Negative for back pain and neck pain.  Neurological:  Negative for headaches.  Psychiatric/Behavioral:  Negative for self-injury.      Physical Exam Updated Vital Signs BP 121/81 (BP Location: Right Arm)   Pulse 85   Temp 99.3 F (37.4 C) (Oral)   Resp 12   SpO2 95%  Physical Exam Vitals and nursing note reviewed.  Constitutional:      Appearance: Normal appearance. He is well-developed.  HENT:     Head: Atraumatic.     Nose: Nose normal.     Mouth/Throat:     Mouth: Mucous membranes are moist.     Pharynx: Oropharynx is clear.  Eyes:     General: No scleral icterus.    Conjunctiva/sclera: Conjunctivae normal.     Pupils: Pupils are equal, round, and reactive to light.  Neck:     Trachea: No tracheal deviation.  Cardiovascular:     Rate and Rhythm: Normal rate and regular rhythm.     Pulses: Normal pulses.     Heart sounds: Normal heart sounds. No murmur heard.    No friction rub. No gallop.  Pulmonary:     Effort: Pulmonary effort is normal. No accessory muscle usage or respiratory distress.     Breath sounds: Normal breath sounds.  Abdominal:     General: There is no distension.     Palpations: Abdomen is soft.     Tenderness: There is no abdominal tenderness.  Musculoskeletal:        General: No swelling or tenderness.     Cervical back: Normal range of motion and neck supple. No  rigidity.  Skin:    General: Skin is warm and dry.     Findings: No rash.  Neurological:     Mental Status: He is alert.     Comments: Alert, speech clear. Motor/sens grossly intact bil.   Psychiatric:     Comments: Denies plan or desire to harm self or others. Does not appear to be actively responding to internal stimuli - no hallucinations noted.      ED Results / Procedures / Treatments   Labs (all labs ordered are listed, but only abnormal results are displayed) Results for orders placed or performed during the hospital encounter of 04/20/24  Basic metabolic panel with GFR   Collection Time: 04/20/24  9:23 PM  Result Value Ref Range   Sodium 137 135 - 145 mmol/L   Potassium 3.2 (L) 3.5 - 5.1 mmol/L    Chloride 101 98 - 111 mmol/L   CO2 23 22 - 32 mmol/L   Glucose, Bld 100 (H) 70 - 99 mg/dL   BUN 14 6 - 20 mg/dL   Creatinine, Ser 6.01 0.61 - 1.24 mg/dL   Calcium  8.6 (L) 8.9 - 10.3 mg/dL   GFR, Estimated >09 >32 mL/min   Anion gap 13 5 - 15  CBC   Collection Time: 04/20/24  9:23 PM  Result Value Ref Range   WBC 8.7 4.0 - 10.5 K/uL   RBC 5.02 4.22 - 5.81 MIL/uL   Hemoglobin 14.2 13.0 - 17.0 g/dL   HCT 35.5 73.2 - 20.2 %   MCV 88.8 80.0 - 100.0 fL   MCH 28.3 26.0 - 34.0 pg   MCHC 31.8 30.0 - 36.0 g/dL   RDW 54.2 70.6 - 23.7 %   Platelets 417 (H) 150 - 400 K/uL   nRBC 0.0 0.0 - 0.2 %  Rapid urine drug screen (hospital performed)   Collection Time: 04/20/24  9:23 PM  Result Value Ref Range   Opiates NONE DETECTED NONE DETECTED   Cocaine NONE DETECTED NONE DETECTED   Benzodiazepines NONE DETECTED NONE DETECTED   Amphetamines NONE DETECTED NONE DETECTED   Tetrahydrocannabinol NONE DETECTED NONE DETECTED   Barbiturates NONE DETECTED NONE DETECTED  Ethanol   Collection Time: 04/20/24  9:23 PM  Result Value Ref Range   Alcohol, Ethyl (B) 130 (H) <15 mg/dL      EKG None  Radiology No results found.  Procedures Procedures    Medications Ordered in ED Medications  potassium chloride  SA (KLOR-CON  M) CR tablet 40 mEq (has no administration in time range)    ED Course/ Medical Decision Making/ A&P                                 Medical Decision Making Problems Addressed: Acute alcoholic intoxication without complication [facility]): acute illness or injury with systemic symptoms that poses a threat to life or bodily functions Acute stress reaction causing mixed disturbance of emotion and conduct: acute illness or injury Alcohol abuse with alcohol-induced mood disorder (HCC): acute illness or injury with systemic symptoms  Amount and/or Complexity of Data Reviewed Independent Historian:     Details: Gpd, hx External Data Reviewed: notes. Labs: ordered. Decision-making  details documented in ED Course.  Risk Prescription drug management. Decision regarding hospitalization.   Labs ordered/sent.  Differential diagnosis includes alcohol intoxication, acute stress reaction, etc . Dispo decision including potential need for admission considered - will get labs and reassess,.   Reviewed  nursing notes and prior charts for additional history. External reports reviewed.   Labs reviewed/interpreted by me - wbc and hgb normal. Chem w k mildly low, otherwise unremarkable. Kcl po. Uds neg. Etoh elevated, 130, c/w acute alcohol intoxication. Pt denies daily use, denies dts/seizures.   Pt asking for something to eat/drink - provided.   The patient has been placed in psychiatric observation due to the need to provide a safe environment for the patient while obtaining psychiatric consultation and evaluation, as well as ongoing medical and medication management to treat the patient's condition.    Plan to metabolize, and reassess patient.  On recheck pt is sleeping comfortably.  If after metabolizing, feel likely patient will be stable for ed d/c, with encouragement to continue to pursue sobriety, as well as close outpatient bh and pcp f/u.  Oncoming EDP team to reassess in AM/dispo per re-assessing team.                    Final Clinical Impression(s) / ED Diagnoses Final diagnoses:  None    Rx / DC Orders ED Discharge Orders     None         Guadalupe Lee, MD 04/20/24 2219

## 2024-04-21 NOTE — ED Notes (Signed)
 Pt ate crackers and drank multiple cups of orange juice with no complaint or issue.

## 2024-04-21 NOTE — ED Notes (Signed)
 As walking out of ED, he also broke a Wall-E on his way out.

## 2024-04-21 NOTE — ED Notes (Signed)
 As patient is getting escorted with security, he knocked everything in front of the secretary's desk, folders, breaking vase etc.

## 2024-04-21 NOTE — ED Provider Notes (Signed)
 Sleeping soundly all night.  PO challenged.  No SI Threw charts, a vase and the WALLE-e.  Will be trespassed    Braydee Shimkus, MD 04/21/24 [phone]

## 2024-04-21 NOTE — ED Notes (Signed)
 Patient up for discharge, stated that he was not leaving. Security was called

## 2024-04-24 ENCOUNTER — Emergency Department (HOSPITAL_COMMUNITY)
Admission: EM | Admit: 2024-04-24 | Discharge: 2024-04-25 | Disposition: A | Discharge status: Other Acute Inpatient Hospital | Payer: Self-pay | Attending: Emergency Medicine | Admitting: Emergency Medicine

## 2024-04-24 ENCOUNTER — Other Ambulatory Visit: Payer: Self-pay

## 2024-04-24 DIAGNOSIS — F10920 Alcohol use, unspecified with intoxication, uncomplicated: Secondary | ICD-10-CM | POA: Insufficient documentation

## 2024-04-24 DIAGNOSIS — F333 Major depressive disorder, recurrent, severe with psychotic symptoms: Secondary | ICD-10-CM | POA: Diagnosis not present

## 2024-04-24 DIAGNOSIS — R45851 Suicidal ideations: Secondary | ICD-10-CM | POA: Insufficient documentation

## 2024-04-24 DIAGNOSIS — Y906 Blood alcohol level of 120-199 mg/100 ml: Secondary | ICD-10-CM | POA: Insufficient documentation

## 2024-04-24 DIAGNOSIS — R45850 Homicidal ideations: Secondary | ICD-10-CM | POA: Insufficient documentation

## 2024-04-24 DIAGNOSIS — R4689 Other symptoms and signs involving appearance and behavior: Secondary | ICD-10-CM

## 2024-04-24 DIAGNOSIS — R456 Violent behavior: Secondary | ICD-10-CM | POA: Diagnosis present

## 2024-04-24 LAB — CBC
HCT: 41.8 % (ref 39.0–52.0)
Hemoglobin: 13.4 g/dL (ref 13.0–17.0)
MCH: 27.9 pg (ref 26.0–34.0)
MCHC: 32.1 g/dL (ref 30.0–36.0)
MCV: 87.1 fL (ref 80.0–100.0)
Platelets: 370 K/uL (ref 150–400)
RBC: 4.8 MIL/uL (ref 4.22–5.81)
RDW: 14.6 % (ref 11.5–15.5)
WBC: 7.2 K/uL (ref 4.0–10.5)
nRBC: 0 % (ref 0.0–0.2)

## 2024-04-24 LAB — COMPREHENSIVE METABOLIC PANEL WITH GFR
ALT: 44 U/L (ref 0–44)
AST: 36 U/L (ref 15–41)
Albumin: 3.7 g/dL (ref 3.5–5.0)
Alkaline Phosphatase: 56 U/L (ref 38–126)
Anion gap: 15 (ref 5–15)
BUN: 15 mg/dL (ref 6–20)
CO2: 23 mmol/L (ref 22–32)
Calcium: 9.2 mg/dL (ref 8.9–10.3)
Chloride: 102 mmol/L (ref 98–111)
Creatinine, Ser: 0.97 mg/dL (ref 0.61–1.24)
GFR, Estimated: >60 mL/min (ref >60)
Glucose, Bld: 94 mg/dL (ref 70–99)
Potassium: 3.7 mmol/L (ref 3.5–5.1)
Sodium: 140 mmol/L (ref 135–145)
Total Bilirubin: 0.6 mg/dL (ref 0.0–1.2)
Total Protein: 7.6 g/dL (ref 6.5–8.1)

## 2024-04-24 LAB — ETHANOL: Alcohol, Ethyl (B): 179 mg/dL — ABNORMAL HIGH

## 2024-04-24 NOTE — ED Triage Notes (Signed)
 The pt reports that he feels si and hi for 3 weeks  he has checked in then left then came back in to be seen the pt reports that he last had alcohol 2 hours ago  he denies drug use

## 2024-04-24 NOTE — ED Provider Notes (Signed)
  [facility] DEPT [facility] Emergency Department Provider Note MRN:  [id number]  Arrival date & time: 04/24/24     Chief Complaint   Psychiatric Evaluation   History of Present Illness   Aaron [name] is a 48 y.o. year-old male presents to the ED with chief complaint of SI and HI.  Has been seen many times for similar.  Reportedly left and drank ETOH and then came back.  States that he has "just been going through it."  History provided by patient.   Review of Systems  Pertinent positive and negative review of systems noted in HPI.    Physical Exam   Vitals:   04/24/24 2114  BP: 107/63  Pulse: 98  Resp: 14  Temp: 98.1 F (36.7 C)  SpO2: 97%    CONSTITUTIONAL:  non toxic-appearing, NAD NEURO:  Alert and oriented x 3, CN 3-12 grossly intact EYES:  eyes equal and reactive ENT/NECK:  Supple, no stridor  CARDIO:  normal rate, appears well-perfused  PULM:  No respiratory distress,  GI/GU:  non-distended,  MSK/SPINE:  No gross deformities, no edema, moves all extremities  SKIN:  no rash, atraumatic   *Additional and/or pertinent findings included in MDM below  Diagnostic and Interventional Summary    EKG Interpretation Date/Time:    Ventricular Rate:    PR Interval:    QRS Duration:    QT Interval:    QTC Calculation:   R Axis:      Text Interpretation:         Labs Reviewed  ETHANOL - Abnormal; Notable for the following components:      Result Value   Alcohol, Ethyl (B) 179 (*)    All other components within normal limits  COMPREHENSIVE METABOLIC PANEL WITH GFR  CBC  RAPID URINE DRUG SCREEN, HOSP PERFORMED    No orders to display    Medications - No data to display   Procedures  /  Critical Care Procedures  ED Course and Medical Decision Making  I have reviewed the triage vital signs, the nursing notes, and pertinent available records from the EMR.  Social Determinants Affecting Complexity of Care: Patient is affected by  alcoholism.   ED Course: Clinical Course as of 04/24/24 2319  Wed Apr 24, 2024  2318 Ethanol(!) ETOH 179, consistent with intoxication [RB]  2318 cbc No leukocytosis or anemia [RB]  2318 Comprehensive metabolic panel No significant electrolyte abnormality [RB]    Clinical Course User Index [RB] Sherel Dikes, PA-C    Medical Decision Making Patient here with SI and HI.  History of aggressive behavior.  Labs look reassuring.  Patient pending TTS evaluation for reported SI/HI.  Amount and/or Complexity of Data Reviewed Labs: ordered. Decision-making details documented in ED Course.         Consultants: TTS consult pending.   Treatment and Plan: Dispo pending TTS.    Final Clinical Impressions(s) / ED Diagnoses     ICD-10-CM   1. Suicidal ideation  R45.851     2. Aggressive behavior  R46.89     3. Alcoholic intoxication without complication [facility])  W09.811       ED Discharge Orders     None         Discharge Instructions Discussed with and Provided to Patient:   Discharge Instructions   None      Sherel Dikes, PA-C 04/24/24 2319    Edson Graces, MD 04/25/24 [phone]

## 2024-04-24 NOTE — ED Notes (Signed)
 Patient transferred from lobby to room 49. Patient resting in bed, When this RN asked patient if he needs anything, patient only stated, "I need out of this world". Patient would not elaborate about meaning of statement to this RN.

## 2024-04-25 ENCOUNTER — Encounter (HOSPITAL_COMMUNITY): Payer: Self-pay | Admitting: Psychiatry

## 2024-04-25 ENCOUNTER — Inpatient Hospital Stay (HOSPITAL_COMMUNITY)
Admission: AD | Admit: 2024-04-25 | Discharge: 2024-05-01 | DRG: 885 | Disposition: A | Discharge status: Home / Self Care | Payer: PRIVATE HEALTH INSURANCE | Source: Intra-hospital | Attending: Psychiatry | Admitting: Psychiatry

## 2024-04-25 DIAGNOSIS — F600 Paranoid personality disorder: Secondary | ICD-10-CM | POA: Diagnosis present

## 2024-04-25 DIAGNOSIS — F17210 Nicotine dependence, cigarettes, uncomplicated: Secondary | ICD-10-CM | POA: Diagnosis present

## 2024-04-25 DIAGNOSIS — F419 Anxiety disorder, unspecified: Secondary | ICD-10-CM | POA: Diagnosis present

## 2024-04-25 DIAGNOSIS — R41843 Psychomotor deficit: Secondary | ICD-10-CM | POA: Diagnosis present

## 2024-04-25 DIAGNOSIS — Z79899 Other long term (current) drug therapy: Secondary | ICD-10-CM

## 2024-04-25 DIAGNOSIS — G4700 Insomnia, unspecified: Secondary | ICD-10-CM | POA: Diagnosis present

## 2024-04-25 DIAGNOSIS — Z599 Problem related to housing and economic circumstances, unspecified: Secondary | ICD-10-CM | POA: Diagnosis not present

## 2024-04-25 DIAGNOSIS — E119 Type 2 diabetes mellitus without complications: Secondary | ICD-10-CM | POA: Diagnosis present

## 2024-04-25 DIAGNOSIS — Y906 Blood alcohol level of 120-199 mg/100 ml: Secondary | ICD-10-CM | POA: Diagnosis present

## 2024-04-25 DIAGNOSIS — R45851 Suicidal ideations: Secondary | ICD-10-CM | POA: Diagnosis present

## 2024-04-25 DIAGNOSIS — Z560 Unemployment, unspecified: Secondary | ICD-10-CM

## 2024-04-25 DIAGNOSIS — F332 Major depressive disorder, recurrent severe without psychotic features: Principal | ICD-10-CM | POA: Diagnosis present

## 2024-04-25 DIAGNOSIS — F10229 Alcohol dependence with intoxication, unspecified: Secondary | ICD-10-CM | POA: Diagnosis present

## 2024-04-25 DIAGNOSIS — Z5982 Transportation insecurity: Secondary | ICD-10-CM

## 2024-04-25 DIAGNOSIS — Z7984 Long term (current) use of oral hypoglycemic drugs: Secondary | ICD-10-CM | POA: Diagnosis not present

## 2024-04-25 DIAGNOSIS — F1020 Alcohol dependence, uncomplicated: Secondary | ICD-10-CM | POA: Diagnosis not present

## 2024-04-25 DIAGNOSIS — F4310 Post-traumatic stress disorder, unspecified: Secondary | ICD-10-CM | POA: Diagnosis present

## 2024-04-25 DIAGNOSIS — E785 Hyperlipidemia, unspecified: Secondary | ICD-10-CM | POA: Diagnosis present

## 2024-04-25 DIAGNOSIS — F333 Major depressive disorder, recurrent, severe with psychotic symptoms: Principal | ICD-10-CM | POA: Diagnosis present

## 2024-04-25 DIAGNOSIS — I10 Essential (primary) hypertension: Secondary | ICD-10-CM | POA: Diagnosis present

## 2024-04-25 DIAGNOSIS — Z91148 Patient's other noncompliance with medication regimen for other reason: Secondary | ICD-10-CM

## 2024-04-25 MED ORDER — HALOPERIDOL 5 MG PO TABS: 5.0000 mg | ORAL_TABLET | Freq: Three times a day (TID) | ORAL | Status: DC | PRN

## 2024-04-25 MED ORDER — TRAZODONE HCL 100 MG PO TABS
100.0000 mg | ORAL_TABLET | Freq: Every evening | ORAL | Status: DC | PRN
  Administered 2024-04-25 – 2024-04-26 (×2): 100 mg via ORAL
  Filled 2024-04-25 (×3): qty 1

## 2024-04-25 MED ORDER — LORAZEPAM 1 MG PO TABS: 1.0000 mg | ORAL_TABLET | Freq: Four times a day (QID) | ORAL | Status: AC | PRN

## 2024-04-25 MED ORDER — ONDANSETRON 4 MG PO TBDP: 4.0000 mg | ORAL_TABLET | Freq: Four times a day (QID) | ORAL | Status: AC | PRN

## 2024-04-25 MED ORDER — LOPERAMIDE HCL 2 MG PO CAPS: 2.0000 mg | ORAL_CAPSULE | ORAL | Status: AC | PRN

## 2024-04-25 MED ORDER — LORAZEPAM 2 MG/ML IJ SOLN: 2.0000 mg | Freq: Three times a day (TID) | INTRAMUSCULAR | Status: DC | PRN

## 2024-04-25 MED ORDER — HALOPERIDOL LACTATE 5 MG/ML IJ SOLN: 5.0000 mg | Freq: Three times a day (TID) | INTRAMUSCULAR | Status: DC | PRN

## 2024-04-25 MED ORDER — ADULT MULTIVITAMIN W/MINERALS CH
1.0000 | ORAL_TABLET | Freq: Every day | ORAL | Status: DC
  Administered 2024-04-25 – 2024-05-01 (×7): 1 via ORAL
  Filled 2024-04-25 (×7): qty 1

## 2024-04-25 MED ORDER — HYDROXYZINE HCL 25 MG PO TABS
25.0000 mg | ORAL_TABLET | Freq: Four times a day (QID) | ORAL | Status: AC | PRN
  Administered 2024-04-25 – 2024-04-26 (×2): 25 mg via ORAL
  Filled 2024-04-25 (×3): qty 1

## 2024-04-25 MED ORDER — ACETAMINOPHEN 325 MG PO TABS
650.0000 mg | ORAL_TABLET | Freq: Four times a day (QID) | ORAL | Status: DC | PRN
  Administered 2024-04-30: 650 mg via ORAL
  Filled 2024-04-25: qty 2

## 2024-04-25 MED ORDER — DIPHENHYDRAMINE HCL 25 MG PO CAPS: 50.0000 mg | ORAL_CAPSULE | Freq: Three times a day (TID) | ORAL | Status: DC | PRN

## 2024-04-25 MED ORDER — ALUM & MAG HYDROXIDE-SIMETH 200-200-20 MG/5ML PO SUSP
30.0000 mL | ORAL | Status: DC | PRN
  Administered 2024-04-26 – 2024-04-28 (×2): 30 mL via ORAL
  Filled 2024-04-25 (×2): qty 30

## 2024-04-25 MED ORDER — DIPHENHYDRAMINE HCL 50 MG/ML IJ SOLN: 50.0000 mg | Freq: Three times a day (TID) | INTRAMUSCULAR | Status: DC | PRN

## 2024-04-25 MED ORDER — HALOPERIDOL LACTATE 5 MG/ML IJ SOLN: 10.0000 mg | Freq: Three times a day (TID) | INTRAMUSCULAR | Status: DC | PRN

## 2024-04-25 MED ORDER — MAGNESIUM HYDROXIDE 400 MG/5ML PO SUSP: 30.0000 mL | Freq: Every day | ORAL | Status: DC | PRN

## 2024-04-25 MED ORDER — VITAMIN B-1 100 MG PO TABS
100.0000 mg | ORAL_TABLET | Freq: Every day | ORAL | Status: DC
  Administered 2024-04-26 – 2024-05-01 (×6): 100 mg via ORAL
  Filled 2024-04-25 (×6): qty 1

## 2024-04-25 MED ORDER — THIAMINE HCL 100 MG/ML IJ SOLN
100.0000 mg | Freq: Once | INTRAMUSCULAR | Status: DC
  Filled 2024-04-25: qty 2

## 2024-04-25 MED ORDER — NICOTINE 21 MG/24HR TD PT24
21.0000 mg | MEDICATED_PATCH | Freq: Every day | TRANSDERMAL | Status: DC
  Filled 2024-04-25 (×5): qty 1

## 2024-04-25 NOTE — Progress Notes (Signed)
 Patient is a 48 year old male admitted to unit as a voluntary admission for suicidal thoughts and substance abuse. Upon admission assessment, pt endorsed passive suicidal ideation and verbally agreed not to harm himself while on unit. Pt denied HI and AVH. Pt stated, " I have a lot going on and felt like every thing is going wrong ". 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 Pt stated that he has been sober until two weeks ago. Skin and safety check completed. Skin check documented, no contraband found. Pt escorted on unit and orientation provided with verbalization of understanding. Meal provided and tolerated well. Safety maintained.  04/25/24 1830  Psych Admission Type (Psych Patients Only)  Admission Status Voluntary  Psychosocial Assessment  Patient Complaints Anxiety;Depression;Substance abuse  Eye Contact Brief  Facial Expression Flat  Affect Anxious;Depressed  Speech Logical/coherent  Interaction Assertive  Motor Activity Slow  Appearance/Hygiene In scrubs  Behavior Characteristics Appropriate to situation  Mood Depressed;Anxious  Thought Process  Coherency WDL  Content WDL  Delusions None reported or observed  Perception WDL  Hallucination None reported or observed  Judgment Impaired  Confusion None  Danger to Self  Current suicidal ideation? Passive  Agreement Not to Harm Self Yes  Description of Agreement Verbal  Danger to Others  Danger to Others None reported or observed

## 2024-04-25 NOTE — Progress Notes (Signed)
 Per [facility], pt has been accepted to [facility] on 04/25/2024 Bed Assignment:  407-1  Pt meets inpatient criteria per:  Volanda Gruber, NP  Attending Physician will be: Dr. Zouev  Report can be called to: [phone]  Pt can arrive any time after 5PM  Care Team Notified:  Volanda Gruber, NP, Luther Saltness, RN    [place], [facility] [phone]

## 2024-04-25 NOTE — BH Assessment (Signed)
 Clinician messaged [name], RN: "Hey. It's [name] with TTS. Is the pt able to engage in the assessment, if so is the pt under IVC? Also is the pt medically cleared?"  Clinician awaiting response.   [name], MS, [facility], [facility] Triage Specialist [phone]

## 2024-04-25 NOTE — [facility] Group Notes (Signed)
 [facility] Group Notes:  (Nursing/MHT/Case Management/Adjunct)  Date:  04/25/2024  Time:  10:17 PM  Type of Therapy:  Wrap-up group  Participation Level:  Active  Participation Quality:  Appropriate  Affect:  Appropriate  Cognitive:  Appropriate  Insight:  Appropriate  Engagement in Group:  Engaged  Modes of Intervention:  Education  Summary of Progress/Problems: no goal, new admission.   Aaron [name] 04/25/2024, 10:17 PM

## 2024-04-25 NOTE — Progress Notes (Signed)
   04/25/24 2058  Psych Admission Type (Psych Patients Only)  Admission Status Voluntary  Psychosocial Assessment  Patient Complaints Self-harm thoughts;Substance abuse;Depression;Anxiety  Eye Contact Brief  Facial Expression Flat  Affect Anxious;Depressed  Speech Logical/coherent  Interaction Assertive  Motor Activity Slow  Appearance/Hygiene In scrubs  Behavior Characteristics Appropriate to situation  Mood Depressed;Anxious  Thought Process  Coherency WDL  Content WDL  Delusions None reported or observed  Perception WDL  Hallucination None reported or observed  Judgment Impaired  Confusion None  Danger to Self  Current suicidal ideation? Passive  Agreement Not to Harm Self Yes  Description of Agreement verbal  Danger to Others  Danger to Others None reported or observed

## 2024-04-25 NOTE — Tx Team (Signed)
 Initial Treatment Plan 04/25/2024 6:48 PM Aaron [name] JYN:[id number]    PATIENT STRESSORS: Financial difficulties   Substance abuse     PATIENT STRENGTHS: Average or above average intelligence  Communication skills  General fund of knowledge  Motivation for treatment/growth    PATIENT IDENTIFIED PROBLEMS: "Anxiety"  "Depression"   "Substance abuse"                 DISCHARGE CRITERIA:  Ability to meet basic life and health needs Adequate post-discharge living arrangements Improved stabilization in mood, thinking, and/or behavior Motivation to continue treatment in a less acute level of care Verbal commitment to aftercare and medication compliance  PRELIMINARY DISCHARGE PLAN: Attend aftercare/continuing care group Attend PHP/IOP Attend 12-step recovery group Outpatient therapy  PATIENT/FAMILY INVOLVEMENT: This treatment plan has been presented to and reviewed with the patient, Aaron [name].  The patient and family have been given the opportunity to ask questions and make suggestions.  Rometta Coad [place], RN 04/25/2024, 6:48 PM

## 2024-04-25 NOTE — ED Notes (Signed)
 Report to [place], Charity fundraiser at [facility].

## 2024-04-25 NOTE — Plan of Care (Signed)
   Problem: Education: Goal: Emotional status will improve Outcome: Not Progressing Goal: Mental status will improve Outcome: Not Progressing

## 2024-04-25 NOTE — ED Notes (Signed)
 TTS session complete. Patient given a cup of water, currently sleeping, in NAD, in view of safety sitter.

## 2024-04-25 NOTE — Plan of Care (Signed)
 Nurse discussed anxiety, depression and coping skills with patient.

## 2024-04-25 NOTE — ED Provider Notes (Signed)
 Emergency Medicine Observation Re-evaluation Note  Aaron [name] is a 48 y.o. male, seen on rounds today.  Pt initially presented to the ED for complaints of Psychiatric Evaluation Currently, the patient is asleep.  Physical Exam  BP 109/68   Pulse 94   Temp 98.1 F (36.7 C) (Oral)   Resp 14   Ht 5\' 2"  (1.575 m)   Wt 75.3 kg   SpO2 97%   BMI 30.36 kg/m  Physical Exam General: asleep Cardiac: asleep Lungs: asleep Psych: asleep  ED Course / MDM  EKG:   I have reviewed the labs performed to date as well as medications administered while in observation.  Recent changes in the last 24 hours include elevated ETOH level.  Plan  Current plan is for inpatient psychiatric treatment.    Jerilynn Montenegro, MD 04/25/24 [phone]

## 2024-04-25 NOTE — BH Assessment (Signed)
 Comprehensive Clinical Assessment (CCA) Note  04/25/2024 Aaron [name] [id number]  Disposition: Aaron Gauze, NP recommends inpatient treatment. CSW to seek placement. Disposition discussed with Aaron Beau, RN.   The patient demonstrates the following risk factors for suicide: Chronic risk factors for suicide include: psychiatric disorder of Major Depressive Disorder, recurrent, severe, with psychosis (HCC) and substance use disorder. Acute risk factors for suicide include: Pt reports, SI. Protective factors for this patient include: None. Considering these factors, the overall suicide risk at this point appears to be high. Patient is not appropriate for outpatient follow up.  Aaron [name] is a 48 year old male who presents voluntary and unaccompanied to [facility]. Clinician asked the pt, "what brought you to the hospital?" Pt reports, not been good for the past couple of days. Pt reports, he feels useless, fucked up; but is unable to identify triggers of his feelings. Pt reports, he feels suicidal but when asked if he has a plan he replied "I don't know what you will call it." Pt reports, hearing voices telling him to do certain things, hearing his dads and the [facility] voice. Pt reports, access to weapons (knives and .9mm). Pt reported HI to the provider but denies during the assessment.   Pt did not answer when asked about substance use. Pt reports, his BAL was 179 at 2237. Pt denies, being linked to OPT resources (medication management and/or counseling.)   Pt presents laying in hospital bed with his eye closed dosing on and off during the assessment. At times, pt asked clinician to repeat her questions that he missed when he dosed off. Pt's mood was sad. Pt's affect was flat. Pt's insight, judgement was poor. Clinician asked the pt discharged if he can contract for safety. Pt replied he wanted to get help for the voices. Clinician discussed the three possible dispositions  (discharged with OPT resources, observe/reassess by psychiatry or inpatient treatment) in detail.    Chief Complaint:  Chief Complaint  Patient presents with   Psychiatric Evaluation   Visit Diagnosis: Major Depressive Disorder, recurrent, severe, with psychosis (HCC).                            Alcohol use Disorder.   CCA Screening, Triage and Referral (STR)  Patient Reported Information How did you hear about us ? Self  What Is the Reason for Your Visit/Call Today? Pt reports, he's suicidal, hearing voices with access to weapons (knives, .9mm).  How Long Has This Been Causing You Problems? 1 wk - 1 month  What Do You Feel Would Help You the Most Today? Alcohol or Drug Use Treatment; Treatment for Depression or other mood problem   Have You Recently Had Any Thoughts About Hurting Yourself? Yes  Are You Planning to Commit Suicide/Harm Yourself At This time? No   Flowsheet Row ED from 04/24/2024 in [facility] Emergency Department at [facility] ED from 04/20/2024 in [facility] Emergency Department at [facility] ED from 11/23/2023 in [facility] Emergency Department at [facility]  C-SSRS RISK CATEGORY High Risk Low Risk No Risk       Have you Recently Had Thoughts About Hurting Someone Aaron [name]? No  Are You Planning to Harm Someone at This Time? No  Explanation: NA   Have You Used Any Alcohol or Drugs in the Past 24 Hours? Yes  How Long Ago Did You Use Drugs or Alcohol? UTA What Did You  Use and How Much? Per chart, pt's BAL was 179 at 2237.   Do You Currently Have a Therapist/Psychiatrist? No  Name of Therapist/Psychiatrist:    Have You Been Recently Discharged From Any Office Practice or Programs? No  Explanation of Discharge From Practice/Program: D/C from [facility] today.     CCA Screening Triage Referral Assessment Type of Contact: Tele-Assessment  Telemedicine Service Delivery: Telemedicine service delivery: This service was  provided via telemedicine using a 2-way, interactive audio and video technology  Is this Initial or Reassessment? Is this Initial or Reassessment?: Initial Assessment  Date Telepsych consult ordered in CH[facility]:  Date Telepsych consult ordered in CHL: 04/24/24  Time Telepsych consult ordered in CHL:  Time Telepsych consult ordered in CHL: 2310  Location of Assessment: [facility] ED  Provider Location: [facility] Assessment Services   Collateral Involvement: NA   Does Patient Have a Automotive engineer Guardian? No  Legal Guardian Contact Information: Pt is his own guardian.  Copy of Legal Guardianship Form: -- (Pt is his own guardian.)  Legal Guardian Notified of Arrival: -- (Pt is his own guardian.)  Legal Guardian Notified of Pending Discharge: -- (Pt is his own guardian.)  If Minor and Not Living with Parent(s), Who has Custody? Pt is a adult.  Is CPS involved or ever been involved? -- (UTA)  Is APS involved or ever been involved? -- (UTA)   Patient Determined To Be At Risk for Harm To Self or Others Based on Review of Patient Reported Information or Presenting Complaint? Yes, for Self-Harm  Method: No Plan  Availability of Means: No access or NA  Intent: Vague intent or NA  Notification Required: No need or identified person  Additional Information for Danger to Others Potential: -- (NA)  Additional Comments for Danger to Others Potential: Pt reported HI to the provider but denies during the assessment.  Are There Guns or Other Weapons in Your Home? Yes  Types of Guns/Weapons: Knives and guns (.9mm).  Are These Weapons Safely Secured?                            -- (UTA)  Who Could Verify You Are Able To Have These Secured: UTA  Do You Have any Outstanding Charges, Pending Court Dates, Parole/Probation? Pt denies, legal involvement.  Contacted To Inform of Risk of Harm To Self or Others: Other: Comment (NA)    Does Patient Present under Involuntary Commitment?  No    [place] of Residence: [place]   Patient Currently Receiving the Following Services: Not Receiving Services   Determination of Need: Emergent (2 hours)   Options For Referral: Inpatient Hospitalization; Facility-Based Crisis; Medication Management; Outpatient Therapy     CCA Biopsychosocial Patient Reported Schizophrenia/Schizoaffective Diagnosis in Past: No   Strengths: Pt want help for the voices.   Mental Health Symptoms Depression:  Sleep (too much or little); Irritability; Hopelessness; Fatigue; Increase/decrease in appetite; Tearfulness; Difficulty Concentrating; Worthlessness   Duration of Depressive symptoms: Duration of Depressive Symptoms: N/A   Mania:  None   Anxiety:   Restlessness; Irritability; Fatigue; Difficulty concentrating; Worrying   Psychosis:  Hallucinations   Duration of Psychotic symptoms: Duration of Psychotic Symptoms: N/A   Trauma:  -- (UTA)   Obsessions:  -- (UTA)   Compulsions:  -- (UTA)   Inattention:  -- (UTA)   Hyperactivity/Impulsivity:  -- (UTA)   Oppositional/Defiant Behaviors:  None   Emotional Irregularity:  -- (UTA)  Other Mood/Personality Symptoms:  N/A    Mental Status Exam Appearance and self-care  Stature:  Small   Weight:  Overweight   Clothing:  -- (Scrubs.)   Grooming:  Normal   Cosmetic use:  None   Posture/gait:  Other (Comment) (Pt laying in hospital bed.)   Motor activity:  Not Remarkable   Sensorium  Attention:  Distractible   Concentration:  -- (Poor.)   Orientation:  Person; Place   Recall/memory:  Defective in Immediate   Affect and Mood  Affect:  Flat   Mood:  Other (Comment) (Sad.)   Relating  Eye contact:  Avoided   Facial expression:  -- (Flat.)   Attitude toward examiner:  Guarded   Thought and Language  Speech flow: Other (Comment)   Thought content:  Appropriate to Mood and Circumstances   Preoccupation:  None   Hallucinations:  Auditory    Organization:  Circumstantial   Company secretary of Knowledge:  Poor   Intelligence:  Average   Abstraction:  Normal   Judgement:  Poor   Reality Testing:  Adequate   Insight:  Lacking   Decision Making:  Impulsive   Social Functioning  Social Maturity:  Irresponsible   Social Judgement:  "Street Smart"   Stress  Stressors:  Other (Comment) (Pt reports, he does not know.)   Coping Ability:  Overwhelmed   Skill Deficits:  Decision making; Communication; Responsibility; Self-control   Supports:  Family; Friends/Service system     Religion: Religion/Spirituality Are You A Religious Person?:  (Spiritual.) How Might This Affect Treatment?: N/A  Leisure/Recreation: Leisure / Recreation Do You Have Hobbies?: No  Exercise/Diet: Exercise/Diet Do You Exercise?: No Have You Gained or Lost A Significant Amount of Weight in the Past Six Months?: No Do You Follow a Special Diet?: No Do You Have Any Trouble Sleeping?: Yes Explanation of Sleeping Difficulties: Not sleeping well.   CCA Employment/Education Employment/Work Situation: Employment / Work Systems developer:  (Pt reports, no.) Patient's Job has Been Impacted by Current Illness: No Has Patient ever Been in the [employer]?: No  Education: Education Is Patient Currently Attending School?: No Last Grade Completed:  (UTA) Did You Attend College?:  (UTA) Did You Have An Individualized Education Program (IIEP):  (UTA) Did You Have Any Difficulty At School?:  (UTA) Patient's Education Has Been Impacted by Current Illness:  (UTA)   CCA Family/Childhood History Family and Relationship History: Family history Marital status: Single Does patient have children?: Yes How many children?: 1 How is patient's relationship with their children?: Pt did not respond when asked.  Childhood History:  Childhood History By whom was/is the patient raised?: Other (Comment) (UTA) Did patient suffer  any verbal/emotional/physical/sexual abuse as a child?: No Did patient suffer from severe childhood neglect?: No Has patient ever been sexually abused/assaulted/raped as an adolescent or adult?: No Was the patient ever a victim of a crime or a disaster?: No Witnessed domestic violence?: No Has patient been affected by domestic violence as an adult?: No       CCA Substance Use Alcohol/Drug Use: Alcohol / Drug Use Pain Medications: See MAR Prescriptions: See MAR Over the Counter: See MAR History of alcohol / drug use?: Yes Longest period of sobriety (when/how long): UTA Negative Consequences of Use:  (UTA) Withdrawal Symptoms: Other (Comment) (UTA) Substance #1 Name of Substance 1: Alcohol. 1 - Age of First Use: UTA 1 - Amount (size/oz): Pt reports, his BAL was 179 at 2237. 1 - Frequency: UTA  1 - Duration: UTA 1 - Last Use / Amount: UTA 1 - Method of Aquiring: UTA 1- Route of Use: UTA    ASAM's:  Six Dimensions of Multidimensional Assessment  Dimension 1:  Acute Intoxication and/or Withdrawal Potential:   Dimension 1:  Description of individual's past and current experiences of substance use and withdrawal: Unsure.  Dimension 2:  Biomedical Conditions and Complications:   Dimension 2:  Description of patient's biomedical conditions and  complications: Unsure.  Dimension 3:  Emotional, Behavioral, or Cognitive Conditions and Complications:  Dimension 3:  Description of emotional, behavioral, or cognitive conditions and complications: Unsure.  Dimension 4:  Readiness to Change:  Dimension 4:  Description of Readiness to Change criteria: Unsure.  Dimension 5:  Relapse, Continued use, or Continued Problem Potential:  Dimension 5:  Relapse, continued use, or continued problem potential critiera description: Unsure.  Dimension 6:  Recovery/Living Environment:  Dimension 6:  Recovery/Iiving environment criteria description: Unsure.  ASAM Severity Score:    ASAM Recommended Level of  Treatment:     Substance use Disorder (SUD)    Recommendations for Services/Supports/Treatments: Recommendations for Services/Supports/Treatments Recommendations For Services/Supports/Treatments: Inpatient Hospitalization  Disposition Recommendation per psychiatric provider: We recommend inpatient psychiatric hospitalization when medically cleared. Patient is under voluntary admission status at this time; please IVC if attempts to leave hospital.   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     Referrals to Alternative Service(s): Referred to Alternative Service(s):   Place:   Date:   Time:    Referred to Alternative Service(s):   Place:   Date:   Time:    Referred to Alternative Service(s):   Place:   Date:   Time:    Referred to Alternative Service(s):   Place:   Date:   Time:     [name], [facility] Comprehensive Clinical Assessment (CCA) Screening, Triage and Referral Note  04/25/2024 Aaron [name] [id number]  Chief Complaint:  Chief Complaint  Patient presents with   Psychiatric Evaluation   Visit Diagnosis:   Patient Reported Information How did you hear about us ? Self  What Is the Reason for Your Visit/Call Today? Pt reports, he's suicidal, hearing voices with access to weapons (knives, .9mm).  How Long Has This Been Causing You Problems? 1 wk - 1 month  What Do You Feel Would Help You the Most Today? Alcohol or Drug Use Treatment; Treatment for Depression or other mood problem   Have You Recently Had Any Thoughts About Hurting Yourself? Yes  Are You Planning to Commit Suicide/Harm Yourself At This time? No   Have  you Recently Had Thoughts About Hurting Someone Aaron [name]? No  Are You Planning to Harm Someone at This Time? No  Explanation: NA   Have You Used Any Alcohol or Drugs in the Past 24 Hours? Yes  How Long Ago Did You Use Drugs or Alcohol? UTA What Did You Use and How Much? Per chart, pt's BAL was 179 at 2237.   Do You Currently Have a Therapist/Psychiatrist? No  Name of Therapist/Psychiatrist: N/A   Have You Been Recently Discharged From Any Office Practice or Programs? No  Explanation of Discharge From Practice/Program: D/C from [facility]  today.    CCA Screening Triage Referral Assessment Type of Contact: Tele-Assessment  Telemedicine Service Delivery: Telemedicine service delivery: This service was provided via telemedicine using a 2-way, interactive audio and video technology  Is this Initial or Reassessment? Is this Initial or Reassessment?: Initial Assessment  Date Telepsych consult ordered in CHL:  Date Telepsych consult ordered in CHL: 04/24/24  Time Telepsych consult ordered in CHL:  Time Telepsych consult ordered in CHL: 2310  Location of Assessment: [facility] ED  Provider Location: [facility] Assessment Services    Collateral Involvement: NA   Does Patient Have a Automotive engineer Guardian? No. Name and Contact of Legal Guardian: Pt is his own guardian.  If Minor and Not Living with Parent(s), Who has Custody? Pt is a adult.  Is CPS involved or ever been involved? -- (UTA)  Is APS involved or ever been involved? -- (UTA)   Patient Determined To Be At Risk for Harm To Self or Others Based on Review of Patient Reported Information or Presenting Complaint? Yes, for Self-Harm  Method: No Plan  Availability of Means: No access or NA  Intent: Vague intent or NA  Notification Required: No need or identified person  Additional Information for Danger to Others Potential: -- (NA)  Additional Comments for Danger to Others Potential: Pt reported HI to the provider but  denies during the assessment.  Are There Guns or Other Weapons in Your Home? Yes  Types of Guns/Weapons: Knives and guns (.9mm).  Are These Weapons Safely Secured?                            -- (UTA)  Who Could Verify You Are Able To Have These Secured: UTA  Do You Have any Outstanding Charges, Pending Court Dates, Parole/Probation? Pt denies, legal involvement.  Contacted To Inform of Risk of Harm To Self or Others: Other: Comment (NA)   Does Patient Present under Involuntary Commitment? No    [place] of Residence: [place]   Patient Currently Receiving the Following Services: Not Receiving Services   Determination of Need: Emergent (2 hours)   Options For Referral: Inpatient Hospitalization; Facility-Based Crisis; Medication Management; Outpatient Therapy   Disposition Recommendation per psychiatric provider: We recommend inpatient psychiatric hospitalization when medically cleared. Patient is under voluntary admission status at this time; please IVC if attempts to leave hospital.  [name], [facility]     [name], MS, [facility], [facility] Triage Specialist [phone]

## 2024-04-25 NOTE — ED Notes (Signed)
Patient in tele psych consult

## 2024-04-26 ENCOUNTER — Encounter (HOSPITAL_COMMUNITY): Payer: Self-pay

## 2024-04-26 MED ORDER — ARIPIPRAZOLE 10 MG PO TABS
10.0000 mg | ORAL_TABLET | Freq: Every day | ORAL | Status: DC
  Administered 2024-04-26: 10 mg via ORAL
  Filled 2024-04-26: qty 1

## 2024-04-26 MED ORDER — SERTRALINE HCL 50 MG PO TABS
50.0000 mg | ORAL_TABLET | Freq: Every day | ORAL | Status: DC
  Administered 2024-04-26 – 2024-04-27 (×2): 50 mg via ORAL
  Filled 2024-04-26 (×2): qty 1

## 2024-04-26 MED ORDER — SIMVASTATIN 5 MG PO TABS
10.0000 mg | ORAL_TABLET | Freq: Every day | ORAL | Status: DC
  Administered 2024-04-26 – 2024-04-30 (×5): 10 mg via ORAL
  Filled 2024-04-26 (×5): qty 2

## 2024-04-26 MED ORDER — METFORMIN HCL ER 500 MG PO TB24
500.0000 mg | ORAL_TABLET | Freq: Every day | ORAL | Status: DC
  Administered 2024-04-27 – 2024-05-01 (×5): 500 mg via ORAL
  Filled 2024-04-26 (×5): qty 1

## 2024-04-26 NOTE — Progress Notes (Signed)
   04/26/24 2200  Psych Admission Type (Psych Patients Only)  Admission Status Voluntary  Psychosocial Assessment  Patient Complaints Substance abuse;Worrying;Hopelessness;Anxiety  Eye Contact Fair  Facial Expression Flat  Affect Anxious;Depressed  Speech Logical/coherent  Interaction Assertive  Motor Activity Other (Comment) (WNL)  Appearance/Hygiene Unremarkable  Behavior Characteristics Appropriate to situation;Cooperative;Calm  Mood Depressed;Anxious;Despair;Sad;Preoccupied  Thought Process  Coherency WDL  Content Blaming self  Delusions None reported or observed  Perception WDL  Hallucination None reported or observed  Judgment Limited  Confusion None  Danger to Self  Current suicidal ideation? Passive  Description of Suicide Plan None  Self-Injurious Behavior No self-injurious ideation or behavior indicators observed or expressed   Agreement Not to Harm Self Yes  Description of Agreement Verbal  Danger to Others  Danger to Others None reported or observed

## 2024-04-26 NOTE — BHH Group Notes (Signed)
 Adult Psychoeducational Group Note  Date:  04/26/2024 Time:  10:25 PM  Group Topic/Focus:  Wrap-Up Group:   The focus of this group is to help patients review their daily goal of treatment and discuss progress on daily workbooks.  Participation Level:  Active  Participation Quality:  Attentive  Affect:  Appropriate  Cognitive:  Alert  Insight: Improving  Engagement in Group:  Engaged  Modes of Intervention:  Discussion  Additional Comments:  Pt attended and participated in AA group.  Aaron [name] Cassandra 04/26/2024, 10:25 PM

## 2024-04-26 NOTE — Progress Notes (Signed)
   04/26/24 1500  Psych Admission Type (Psych Patients Only)  Admission Status Voluntary  Psychosocial Assessment  Patient Complaints Self-harm thoughts  Eye Contact Brief  Facial Expression Flat  Affect Anxious;Depressed  Speech Logical/coherent  Interaction Assertive  Motor Activity Slow  Appearance/Hygiene In scrubs  Behavior Characteristics Appropriate to situation  Mood Depressed;Anxious  Thought Process  Coherency WDL  Content WDL  Delusions None reported or observed  Perception WDL  Hallucination None reported or observed  Judgment Impaired  Confusion None  Danger to Self  Current suicidal ideation? Passive  Agreement Not to Harm Self Yes  Description of Agreement Verbal  Danger to Others  Danger to Others None reported or observed

## 2024-04-26 NOTE — BHH Suicide Risk Assessment (Signed)
 [facility] Admission Suicide Risk Assessment   Nursing information obtained from:  Patient Demographic factors:  Male Current Mental Status:  Suicidal ideation indicated by patient Loss Factors:  Financial problems / change in socioeconomic status Historical Factors:  NA Risk Reduction Factors:  Sense of responsibility to family  Total Time spent with patient: 45 minutes Principal Problem: MDD (major depressive disorder), recurrent, severe, with psychosis (HCC) Diagnosis:  Principal Problem:   MDD (major depressive disorder), recurrent, severe, with psychosis (HCC) Active Problems:   Alcohol dependence (HCC)  Subjective Data: see H&P  Continued Clinical Symptoms:  Alcohol Use Disorder Identification Test Final Score (AUDIT): 20 The "Alcohol Use Disorders Identification Test", Guidelines for Use in Primary Care, Second Edition.  World Science writer [facility]). Score between 0-7:  no or low risk or alcohol related problems. Score between 8-15:  moderate risk of alcohol related problems. Score between 16-19:  high risk of alcohol related problems. Score 20 or above:  warrants further diagnostic evaluation for alcohol dependence and treatment.   CLINICAL FACTORS:   Depression:   Aggression Anhedonia Comorbid alcohol abuse/dependence Hopelessness Impulsivity Insomnia Severe Alcohol/Substance Abuse/Dependencies Previous Psychiatric Diagnoses and Treatments   Musculoskeletal: Strength & Muscle Tone: within normal limits Gait & Station: normal Patient leans: N/A  Psychiatric Specialty Exam:  Presentation  General Appearance:  Disheveled  Eye Contact: Fleeting  Speech: Blocked; Slow  Speech Volume: Decreased  Handedness: Right   Mood and Affect  Mood: Depressed; Anxious  Affect: Congruent   Thought Process  Thought Processes: Goal Directed  Descriptions of Associations:Circumstantial  Orientation:Full (Time, Place and Person)  Thought Content:Logical;  Scattered  History of Schizophrenia/Schizoaffective disorder:No  Duration of Psychotic Symptoms:N/A  Hallucinations:Hallucinations: Auditory Description of Auditory Hallucinations: "saying you're worthless"  Ideas of Reference:None  Suicidal Thoughts:Suicidal Thoughts: Yes, Passive  Homicidal Thoughts:Homicidal Thoughts: No   Sensorium  Memory: Immediate Fair  Judgment: Fair  Insight: Fair   Art therapist  Concentration: Fair  Attention Span: Fair  Recall: Fair  Fund of Knowledge: Fair  Language: Fair   Psychomotor Activity  Psychomotor Activity: Psychomotor Activity: Decreased   Assets  Assets: Communication Skills; Desire for Improvement; Resilience   Sleep  Sleep: Sleep: Poor    Physical Exam: Physical Exam ROS Blood pressure 111/84, pulse 65, temperature 98.1 F (36.7 C), temperature source Oral, resp. rate 14, height 5\' 2"  (1.575 m), weight 75 kg, SpO2 98%. Body mass index is 30.24 kg/m.   COGNITIVE FEATURES THAT CONTRIBUTE TO RISK:  Polarized thinking    SUICIDE RISK:   Severe:  Frequent, intense, and enduring suicidal ideation, specific plan, no subjective intent, but some objective markers of intent (i.e., choice of lethal method), the method is accessible, some limited preparatory behavior, evidence of impaired self-control, severe dysphoria/symptomatology, multiple risk factors present, and few if any protective factors, particularly a lack of social support.  PLAN OF CARE: admit to inpatient psychiatry  I certify that inpatient services furnished can reasonably be expected to improve the patient's condition.   Illona Bulman, MD 04/26/2024, 7:37 PM

## 2024-04-26 NOTE — Progress Notes (Signed)
   04/26/24 1300  Charting Type  Charting Type Shift assessment  Safety Check Verification  Has the RN verified the 15 minute safety check completion? Yes  Neurological  Neuro (WDL) WDL  HEENT  HEENT (WDL) WDL  Respiratory  Respiratory (WDL) WDL  Cardiac  Cardiac (WDL) WDL  Vascular  Vascular (WDL) WDL  Integumentary  Integumentary (WDL) X  Braden Scale (Ages 8 and up)  Sensory Perceptions 4  Moisture 4  Activity 4  Mobility 4  Nutrition 3  Friction and Shear 3  Braden Scale Score 22  Musculoskeletal  Musculoskeletal (WDL) WDL  Gastrointestinal  Gastrointestinal (WDL) WDL  GU Assessment  Genitourinary (WDL) WDL  Neurological  Level of Consciousness Alert

## 2024-04-26 NOTE — Plan of Care (Signed)
   Problem: Education: Goal: Knowledge of [facility] Education information/materials will improve Outcome: Progressing Goal: Mental status will improve Outcome: Progressing Goal: Verbalization of understanding the information provided will improve Outcome: Progressing   Problem: Activity: Goal: Interest or engagement in activities will improve Outcome: Progressing   Problem: Education: Goal: Emotional status will improve Outcome: Not Progressing

## 2024-04-26 NOTE — Group Note (Signed)
 Recreation Therapy Group Note   Group Topic:Healthy Decision Making  Group Date: 04/26/2024 Start Time: 0931 End Time: 1000 Facilitators: Fahima Cifelli-McCall, LRT,CTRS Location: [address] Dayroom   Group Topic: Communication, Team Building, Problem Solving   Goal Area(s) Addresses:  Patient will effectively work with peer towards shared goal.  Patient will identify skill used to make activity successful.  Patient will identify how skills used during activity can be used to reach post d/c goals.    Behavioral Response:    Intervention: STEM Activity    Activity: In team's, using 20 small plastic cups, patients were asked to build the tallest free standing tower possible.     Education: Pharmacist, community, Building control surveyor.    Education Outcome: Acknowledges education/In group clarification offered/Needs additional education.    Affect/Mood: N/A   Participation Level: Did not attend    Clinical Observations/Individualized Feedback:     Plan: Continue to engage patient in RT group sessions 2-3x/week.   Montserrath Madding-McCall, LRT,CTRS 04/26/2024 11:23 AM

## 2024-04-26 NOTE — Group Note (Signed)
 Date:  04/26/2024 Time:  9:24 AM  Group Topic/Focus:  Goals Group:   The focus of this group is to help patients establish daily goals to achieve during treatment and discuss how the patient can incorporate goal setting into their daily lives to aide in recovery.    Participation Level:  Did Not Attend  Participation Quality:    Affect:    Cognitive:    Insight:   Engagement in Group:    Modes of Intervention:    Additional Comments: Pt were aware of group time. Pt refused.  Tita Form 04/26/2024, 9:24 AM

## 2024-04-26 NOTE — Plan of Care (Signed)
  Problem: Education: Goal: Knowledge of [facility] Education information/materials will improve Outcome: Progressing Goal: Emotional status will improve Outcome: Progressing Goal: Mental status will improve Outcome: Progressing Goal: Verbalization of understanding the information provided will improve Outcome: Progressing   Problem: Activity: Goal: Interest or engagement in activities will improve Outcome: Progressing Goal: Sleeping patterns will improve Outcome: Progressing   Problem: Coping: Goal: Ability to verbalize frustrations and anger appropriately will improve Outcome: Progressing Goal: Ability to demonstrate self-control will improve Outcome: Progressing   Problem: Health Behavior/Discharge Planning: Goal: Identification of resources available to assist in meeting health care needs will improve Outcome: Progressing Goal: Compliance with treatment plan for underlying cause of condition will improve Outcome: Progressing   Problem: Physical Regulation: Goal: Ability to maintain clinical measurements within normal limits will improve Outcome: Progressing   Problem: Safety: Goal: Periods of time without injury will increase Outcome: Progressing   Problem: Education: Goal: Ability to state activities that reduce stress will improve Outcome: Progressing   Problem: Education: Goal: Utilization of techniques to improve thought processes will improve Outcome: Progressing Goal: Knowledge of the prescribed therapeutic regimen will improve Outcome: Progressing   Problem: Education: Goal: Knowledge of disease or condition will improve Outcome: Progressing Goal: Understanding of discharge needs will improve Outcome: Progressing   Problem: Education: Goal: Ability to make informed decisions regarding treatment will improve Outcome: Progressing   Problem: Education: Goal: Ability to state activities that reduce stress will improve Outcome: Progressing

## 2024-04-26 NOTE — Progress Notes (Signed)
 During this shift, patient presented with anxiety and requested medication for sleep.  Administered PRN Hydroxyzine  and Trazodone  per [facility] per patient request.  Patient refused vitamin B injection.  Patient reported he would be willing to take a tablet.  Patient is safe on the unit with q15 minute checks.
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
## 2024-04-26 NOTE — BH IP Treatment Plan (Signed)
 Interdisciplinary Treatment and Diagnostic Plan Update  04/26/2024 Time of Session: 10:15 AM  Aaron [name] MRN: [id number]  Principal Diagnosis: MDD (major depressive disorder), recurrent, severe, with psychosis (HCC)  Secondary Diagnoses: Principal Problem:   MDD (major depressive disorder), recurrent, severe, with psychosis (HCC) Active Problems:   Alcohol dependence (HCC)   Current Medications:  Current Facility-Administered Medications  Medication Dose Route Frequency Provider Last Rate Last Admin   acetaminophen  (TYLENOL ) tablet 650 mg  650 mg Oral Q6H PRN Volanda Gruber, NP       alum & mag hydroxide-simeth (MAALOX/MYLANTA) 200-200-20 MG/5ML suspension 30 mL  30 mL Oral Q4H PRN Volanda Gruber, NP   30 mL at 04/26/24 1403   haloperidol (HALDOL) tablet 5 mg  5 mg Oral TID PRN Volanda Gruber, NP       And   diphenhydrAMINE (BENADRYL) capsule 50 mg  50 mg Oral TID PRN Volanda Gruber, NP       haloperidol lactate (HALDOL) injection 5 mg  5 mg Intramuscular TID PRN Volanda Gruber, NP       And   diphenhydrAMINE (BENADRYL) injection 50 mg  50 mg Intramuscular TID PRN Volanda Gruber, NP       And   LORazepam  (ATIVAN ) injection 2 mg  2 mg Intramuscular TID PRN Volanda Gruber, NP       haloperidol lactate (HALDOL) injection 10 mg  10 mg Intramuscular TID PRN Volanda Gruber, NP       And   diphenhydrAMINE (BENADRYL) injection 50 mg  50 mg Intramuscular TID PRN Volanda Gruber, NP       And   LORazepam  (ATIVAN ) injection 2 mg  2 mg Intramuscular TID PRN Volanda Gruber, NP       hydrOXYzine  (ATARAX ) tablet 25 mg  25 mg Oral Q6H PRN Volanda Gruber, NP   25 mg at 04/25/24 2058   loperamide  (IMODIUM ) capsule 2-4 mg  2-4 mg Oral PRN Volanda Gruber, NP       LORazepam  (ATIVAN ) tablet 1 mg  1 mg Oral Q6H PRN Volanda Gruber, NP       magnesium  hydroxide (MILK OF MAGNESIA) suspension 30 mL  30 mL Oral Daily PRN Volanda Gruber, NP       multivitamin with  minerals tablet 1 tablet  1 tablet Oral Daily Volanda Gruber, NP   1 tablet at 04/26/24 4098   nicotine (NICODERM CQ - dosed in mg/24 hours) patch 21 mg  21 mg Transdermal Q0600 Volanda Gruber, NP       ondansetron  (ZOFRAN -ODT) disintegrating tablet 4 mg  4 mg Oral Q6H PRN Volanda Gruber, NP       thiamine  (Vitamin B-1) tablet 100 mg  100 mg Oral Daily Volanda Gruber, NP   100 mg at 04/26/24 1191   thiamine  (VITAMIN B1) injection 100 mg  100 mg Intramuscular Once Volanda Gruber, NP       traZODone  (DESYREL ) tablet 100 mg  100 mg Oral QHS PRN Volanda Gruber, NP   100 mg at 04/25/24 2058   PTA Medications: Medications Prior to Admission  Medication Sig Dispense Refill Last Dose/Taking   metFORMIN  (GLUCOPHAGE ) 500 MG tablet Take 1 tablet (500 mg total) by mouth daily. (Patient not taking: Reported on 04/25/2024) 30 tablet 1    sertraline  (ZOLOFT ) 50 MG tablet Take 1 tablet (50 mg total) by mouth daily. (Patient not taking: Reported  on 04/25/2024) 30 tablet 0    simvastatin  (ZOCOR ) 10 MG tablet Take 1 tablet (10 mg total) by mouth daily. (Patient not taking: Reported on 04/25/2024) 30 tablet 1     Patient Stressors: Financial difficulties   Substance abuse    Patient Strengths: Average or above average intelligence  Forensic psychologist fund of knowledge  Motivation for treatment/growth   Treatment Modalities: Medication Management, Group therapy, Case management,  1 to 1 session with clinician, Psychoeducation, Recreational therapy.   Physician Treatment Plan for Primary Diagnosis: MDD (major depressive disorder), recurrent, severe, with psychosis (HCC) Long Term Goal(s):     Short Term Goals:    Medication Management: Evaluate patient's response, side effects, and tolerance of medication regimen.  Therapeutic Interventions: 1 to 1 sessions, Unit Group sessions and Medication administration.  Evaluation of Outcomes: Not Progressing  Physician Treatment Plan for  Secondary Diagnosis: Principal Problem:   MDD (major depressive disorder), recurrent, severe, with psychosis (HCC) Active Problems:   Alcohol dependence (HCC)  Long Term Goal(s):     Short Term Goals:       Medication Management: Evaluate patient's response, side effects, and tolerance of medication regimen.  Therapeutic Interventions: 1 to 1 sessions, Unit Group sessions and Medication administration.  Evaluation of Outcomes: Not Progressing   RN Treatment Plan for Primary Diagnosis: MDD (major depressive disorder), recurrent, severe, with psychosis (HCC) Long Term Goal(s): Knowledge of disease and therapeutic regimen to maintain health will improve  Short Term Goals: Ability to remain free from injury will improve, Ability to verbalize frustration and anger appropriately will improve, Ability to demonstrate self-control, Ability to participate in decision making will improve, Ability to verbalize feelings will improve, Ability to disclose and discuss suicidal ideas, Ability to identify and develop effective coping behaviors will improve, and Compliance with prescribed medications will improve  Medication Management: RN will administer medications as ordered by provider, will assess and evaluate patient's response and provide education to patient for prescribed medication. RN will report any adverse and/or side effects to prescribing provider.  Therapeutic Interventions: 1 on 1 counseling sessions, Psychoeducation, Medication administration, Evaluate responses to treatment, Monitor vital signs and CBGs as ordered, Perform/monitor CIWA, COWS, AIMS and Fall Risk screenings as ordered, Perform wound care treatments as ordered.  Evaluation of Outcomes: Not Progressing   LCSW Treatment Plan for Primary Diagnosis: MDD (major depressive disorder), recurrent, severe, with psychosis (HCC) Long Term Goal(s): Safe transition to appropriate next level of care at discharge, Engage patient in  therapeutic group addressing interpersonal concerns.  Short Term Goals: Engage patient in aftercare planning with referrals and resources, Increase social support, Increase ability to appropriately verbalize feelings, Increase emotional regulation, Facilitate acceptance of mental health diagnosis and concerns, Facilitate patient progression through stages of change regarding substance use diagnoses and concerns, Identify triggers associated with mental health/substance abuse issues, and Increase skills for wellness and recovery  Therapeutic Interventions: Assess for all discharge needs, 1 to 1 time with Social worker, Explore available resources and support systems, Assess for adequacy in community support network, Educate family and significant other(s) on suicide prevention, Complete Psychosocial Assessment, Interpersonal group therapy.  Evaluation of Outcomes: Not Progressing   Progress in Treatment: Attending groups: attended some groups Participating in groups: Yes. Taking medication as prescribed: Yes. Toleration medication: Yes. Family/Significant other contact made: consents are pending Patient understands diagnosis: Yes. Discussing patient identified problems/goals with staff: Yes. Medical problems stabilized or resolved: Yes. Denies suicidal/homicidal ideation: Yes. Issues/concerns per patient self-inventory: No.  New problem(s) identified:  No  New Short Term/Long Term Goal(s):    medication stabilization, elimination of SI thoughts, development of comprehensive mental wellness plan.   Patient Goals:  "I want to get back on my medications and get myself together.  I just want to live a normal life, manage my anxiety and depression, and get my mind right."  Discharge Plan or Barriers:  Patient recently admitted. CSW will continue to follow and assess for appropriate referrals and possible discharge planning.   Reason for Continuation of Hospitalization: Depression Medication  stabilization Suicidal ideation  Estimated Length of Stay:  5 - 7 days  Last 3 [place] Suicide Severity Risk Score: Flowsheet Row [facility] (Current) [facility] from [facility]24/2024 in [facility] ED from 04/20/2024 in [facility] at [facility]  C-SSRS RISK CATEGORY Low Risk High Risk Low Risk       Last [facility] 2/9 Scores:    10/13/2023    8:13 AM 10/10/2023   11:37 AM 10/08/2023    2:10 AM  Depression screen PHQ 2/9  Decreased Interest 0 1 1  Down, Depressed, Hopeless 0 2 1  PHQ - 2 Score 0 3 2  Altered sleeping  1 1  Tired, decreased energy  1 1  Change in appetite  1 1  Feeling bad or failure about yourself   2 1  Trouble concentrating  1 1  Moving slowly or fidgety/restless  0 0  Suicidal thoughts  1 0  PHQ-9 Score  10 7  Difficult doing work/chores   Somewhat difficult    Scribe for Treatment Team: Marijke Guadiana O Hisashi Amadon, LCSWA 04/26/2024 5:52 PM

## 2024-04-26 NOTE — Progress Notes (Signed)
   04/26/24 0541  15 Minute Checks  Location Bedroom  Visual Appearance Calm  Behavior Sleeping  Sleep (Behavioral Health Patients Only)  Calculate sleep? (Click Yes once per 24 hr at 0600 safety check) Yes  Documented sleep last 24 hours 6.25

## 2024-04-27 LAB — RPR: RPR Ser Ql: NONREACTIVE

## 2024-04-27 LAB — HIV ANTIBODY (ROUTINE TESTING W REFLEX): HIV Screen 4th Generation wRfx: NONREACTIVE

## 2024-04-27 LAB — LIPID PANEL
Cholesterol: 204 mg/dL — ABNORMAL HIGH (ref 0–200)
HDL: 34 mg/dL — ABNORMAL LOW (ref >40)
LDL Cholesterol: 122 mg/dL — ABNORMAL HIGH (ref 0–99)
Total CHOL/HDL Ratio: 6 ratio
Triglycerides: 242 mg/dL — ABNORMAL HIGH (ref <150)
VLDL: 48 mg/dL — ABNORMAL HIGH (ref 0–40)

## 2024-04-27 LAB — TSH: TSH: 1.247 u[IU]/mL (ref 0.350–4.500)

## 2024-04-27 MED ORDER — TRAZODONE HCL 150 MG PO TABS
150.0000 mg | ORAL_TABLET | Freq: Every day | ORAL | Status: DC
  Administered 2024-04-27: 150 mg via ORAL
  Filled 2024-04-27: qty 1

## 2024-04-27 MED ORDER — ARIPIPRAZOLE 15 MG PO TABS
15.0000 mg | ORAL_TABLET | Freq: Every day | ORAL | Status: DC
  Administered 2024-04-27: 15 mg via ORAL
  Filled 2024-04-27: qty 1

## 2024-04-27 MED ORDER — TRAZODONE HCL 150 MG PO TABS: 150.0000 mg | ORAL_TABLET | Freq: Every evening | ORAL | Status: DC | PRN

## 2024-04-27 NOTE — Plan of Care (Signed)
   Problem: Education: Goal: Emotional status will improve Outcome: Progressing   Problem: Activity: Goal: Interest or engagement in activities will improve Outcome: Progressing

## 2024-04-27 NOTE — Progress Notes (Signed)
 [facility] MD Progress Note  04/27/2024 10:12 AM Aaron [name]  MRN:  [id number] Subjective:   Patient continues to remain paranoid, irritable and on edge around others, has been isolative to room. States this is a recent change over the past 2 months and patient states "I'm usually not like this... I'm just angry at my life." Denies side effects from Abilify  or Zoloft  but states he did not sleep well last night. Patient perseverates on negative cognitive distortions and that things will never get better, states "my life is falling apart." Reports feeling better of dead but denies SI or plan today. Presenting with significant psychomotor retardation and anhedonia. Patient does express interest in going to inpatient rehab.  Principal Problem: MDD (major depressive disorder), recurrent, severe, with psychosis (HCC) Diagnosis: Principal Problem:   MDD (major depressive disorder), recurrent, severe, with psychosis (HCC) Active Problems:   Alcohol dependence (HCC)  Total Time spent with patient: 30 minutes  Past Psychiatric History: see H&P  Past Medical History:  Past Medical History:  Diagnosis Date   Depression    Diabetes mellitus without complication (HCC)    Hypertension    History reviewed. No pertinent surgical history. Family History:  Family History  Problem Relation Age of Onset   Cancer Father    Family Psychiatric  History: see H&P Social History:  Social History   Substance and Sexual Activity  Alcohol Use Yes   Comment: heavy     Social History   Substance and Sexual Activity  Drug Use No    Social History   Socioeconomic History   Marital status: Single    Spouse name: Not on file   Number of children: Not on file   Years of education: Not on file   Highest education level: Not on file  Occupational History   Not on file  Tobacco Use   Smoking status: Every Day    Current packs/day: 0.25    Types: Cigarettes   Smokeless tobacco: Never  Vaping Use    Vaping status: Never Used  Substance and Sexual Activity   Alcohol use: Yes    Comment: heavy   Drug use: No   Sexual activity: Not on file  Other Topics Concern   Not on file  Social History Narrative   Not on file   Social Drivers of Health   Financial Resource Strain: Not on file  Food Insecurity: No Food Insecurity (04/25/2024)   Hunger Vital Sign    Worried About Running Out of Food in the Last Year: Never true    Ran Out of Food in the Last Year: Never true  Transportation Needs: No Transportation Needs (04/25/2024)   PRAPARE - Administrator, Civil Service (Medical): No    Lack of Transportation (Non-Medical): No  Physical Activity: Not on file  Stress: Not on file  Social Connections: Unknown (08/13/2021)   Received from [facility]   Social Connections    Frequency of Communication with Friends and Family: Not asked    Frequency of Social Gatherings with Friends and Family: Not asked   Additional Social History:                         Sleep: Poor  Appetite:  Fair  Current Medications: Current Facility-Administered Medications  Medication Dose Route Frequency Provider Last Rate Last Admin   acetaminophen  (TYLENOL ) tablet 650 mg  650 mg Oral Q6H PRN Volanda Gruber, NP  alum & mag hydroxide-simeth (MAALOX/MYLANTA) 200-200-20 MG/5ML suspension 30 mL  30 mL Oral Q4H PRN Volanda Gruber, NP   30 mL at 04/26/24 1403   ARIPiprazole  (ABILIFY ) tablet 15 mg  15 mg Oral QHS Emersen Carroll, MD       haloperidol  (HALDOL ) tablet 5 mg  5 mg Oral TID PRN Volanda Gruber, NP       And   diphenhydrAMINE  (BENADRYL ) capsule 50 mg  50 mg Oral TID PRN Volanda Gruber, NP       haloperidol  lactate (HALDOL ) injection 5 mg  5 mg Intramuscular TID PRN Volanda Gruber, NP       And   diphenhydrAMINE  (BENADRYL ) injection 50 mg  50 mg Intramuscular TID PRN Volanda Gruber, NP       And   LORazepam  (ATIVAN ) injection 2 mg  2 mg Intramuscular TID PRN  Volanda Gruber, NP       haloperidol  lactate (HALDOL ) injection 10 mg  10 mg Intramuscular TID PRN Volanda Gruber, NP       And   diphenhydrAMINE  (BENADRYL ) injection 50 mg  50 mg Intramuscular TID PRN Volanda Gruber, NP       And   LORazepam  (ATIVAN ) injection 2 mg  2 mg Intramuscular TID PRN Volanda Gruber, NP       hydrOXYzine  (ATARAX ) tablet 25 mg  25 mg Oral Q6H PRN Volanda Gruber, NP   25 mg at 04/26/24 2120   loperamide  (IMODIUM ) capsule 2-4 mg  2-4 mg Oral PRN Volanda Gruber, NP       LORazepam  (ATIVAN ) tablet 1 mg  1 mg Oral Q6H PRN Volanda Gruber, NP       magnesium  hydroxide (MILK OF MAGNESIA) suspension 30 mL  30 mL Oral Daily PRN Volanda Gruber, NP       metFORMIN  (GLUCOPHAGE -XR) 24 hr tablet 500 mg  500 mg Oral Q breakfast Dorrian Doggett, MD   500 mg at 04/27/24 1610   multivitamin with minerals tablet 1 tablet  1 tablet Oral Daily Volanda Gruber, NP   1 tablet at 04/27/24 9604   nicotine  (NICODERM CQ  - dosed in mg/24 hours) patch 21 mg  21 mg Transdermal Q0600 Volanda Gruber, NP       ondansetron  (ZOFRAN -ODT) disintegrating tablet 4 mg  4 mg Oral Q6H PRN Volanda Gruber, NP       sertraline  (ZOLOFT ) tablet 50 mg  50 mg Oral QHS Haileyann Staiger, MD   50 mg at 04/26/24 2120   simvastatin  (ZOCOR ) tablet 10 mg  10 mg Oral QHS Alwaleed Obeso, MD   10 mg at 04/26/24 2120   thiamine  (Vitamin B-1) tablet 100 mg  100 mg Oral Daily Volanda Gruber, NP   100 mg at 04/27/24 5409   thiamine  (VITAMIN B1) injection 100 mg  100 mg Intramuscular Once Volanda Gruber, NP       traZODone  (DESYREL ) tablet 150 mg  150 mg Oral QHS Saksham Akkerman, MD        Lab Results:  Results for orders placed or performed during the hospital encounter of 04/25/24 (from the past 48 hours)  Lipid panel     Status: Abnormal   Collection Time: 04/27/24  6:32 AM  Result Value Ref Range   Cholesterol 204 (H) 0 - 200 mg/dL   Triglycerides 811 (H) <150 mg/dL   HDL 34 (L) >91 mg/dL   Total CHOL/HDL  Ratio  6.0 RATIO   VLDL 48 (H) 0 - 40 mg/dL   LDL Cholesterol 161 (H) 0 - 99 mg/dL    Comment:        Total Cholesterol/HDL:CHD Risk Coronary Heart Disease Risk Table                     Men   Women  1/2 Average Risk   3.4   3.3  Average Risk       5.0   4.4  2 X Average Risk   9.6   7.1  3 X Average Risk  23.4   11.0        Use the calculated Patient Ratio above and the CHD Risk Table to determine the patient's CHD Risk.        ATP [name] CLASSIFICATION (LDL):  <100     mg/dL   Optimal  096-045  mg/dL   Near or Above                    Optimal  130-159  mg/dL   Borderline  409-811  mg/dL   High  >914     mg/dL   Very High Performed at [facility], [address], [place], [place] [address]   TSH     Status: None   Collection Time: 04/27/24  6:32 AM  Result Value Ref Range   TSH 1.247 0.350 - 4.500 uIU/mL    Comment: Performed by a 3rd Generation assay with a functional sensitivity of <=0.01 uIU/mL. Performed at [facility], [address], [place], [place] [address]     Blood Alcohol level:  Lab Results  Component Value Date   ETH 179 (H) 04/24/2024   ETH 130 (H) 04/20/2024    Metabolic Disorder Labs: Lab Results  Component Value Date   HGBA1C 5.9 (H) 10/10/2023   MPG 122.63 10/10/2023   No results found for: "PROLACTIN" Lab Results  Component Value Date   CHOL 204 (H) 04/27/2024   TRIG 242 (H) 04/27/2024   HDL 34 (L) 04/27/2024   CHOLHDL 6.0 04/27/2024   VLDL 48 (H) 04/27/2024   LDLCALC 122 (H) 04/27/2024   LDLCALC 81 10/07/2023     Musculoskeletal: Strength & Muscle Tone: within normal limits Gait & Station: normal Patient leans: N/A  Psychiatric Specialty Exam:  Presentation  General Appearance:  Disheveled  Eye Contact: Fleeting  Speech: Slow  Speech Volume: Decreased  Handedness: Right   Mood and Affect  Mood: Anxious; Depressed "Angry today"  Affect: Congruent   Thought Process   Thought Processes: Coherent  Descriptions of Associations:Intact  Orientation:Full (Time, Place and Person)  Thought Content:Paranoid Ideation  History of Schizophrenia/Schizoaffective disorder:No  Duration of Psychotic Symptoms:N/A  Hallucinations:Hallucinations: None Description of Auditory Hallucinations: "saying you're worthless"  Ideas of Reference:Paranoia  Suicidal Thoughts:Suicidal Thoughts: Yes, Passive  Homicidal Thoughts:Homicidal Thoughts: No   Sensorium  Memory: Immediate Fair  Judgment: Fair  Insight: Fair   Art therapist  Concentration: Fair  Attention Span: Fair  Recall: [employer] of Knowledge: Fair  Language: Fair   Psychomotor Activity  Psychomotor Activity: Psychomotor Activity: Decreased   Assets  Assets: Communication Skills; Desire for Improvement; Physical Health   Sleep  Sleep: Sleep: Poor    Physical Exam: General: Well developed, well nourished, african american gentleman, balding Pupils: Normal at 3mm Respiratory: Breathing is unlabored.  Cardiovascular: No edema.  Language: No anomia, no aphasia Muscle strength and tone-pt moving all  extremities.  Gait not assessed as pt remained in bed.  Neuro: Facial muscles are symmetric. Pt without tremor, no evidence of hyperarousal.  Review of Systems  Constitutional: Negative.   HENT: Negative.    Eyes: Negative.   Respiratory: Negative.    Cardiovascular: Negative.   Gastrointestinal: Negative.   Genitourinary: Negative.   Musculoskeletal: Negative.   Skin: Negative.   Neurological: Negative.   Endo/Heme/Allergies: Negative.   Psychiatric/Behavioral:  Positive for depression, substance abuse and suicidal ideas. The patient is nervous/anxious and has insomnia.    Blood pressure 107/74, pulse 65, temperature 98.2 F (36.8 C), temperature source Oral, resp. rate 16, height 5\' 2"  (1.575 m), weight 75 kg, SpO2 97%. Body mass index is 30.24  kg/m.   Treatment Plan Summary: MDD (major depressive disorder), recurrent, severe, with psychosis (HCC) Active Problems:   Alcohol dependence (HCC)    6/6: Patient presenting with worsening depression and suicidal ideation. Had a gun and was thinking about killing himself. States guns is at his friend's Aron Lard. Long history of alcohol abuse. Patient reports he started drinking since April due to multiple psychosocial stressors. Has been more agitated and reporting AH of derogatory voices since April as well and states he has been irritable around other. Patient at high acute risk for suicide.   6/7: presents severely depressed, dysphoric and isolative to room with paranoia and irritability. Tolerating medications and denies AH today but isolative to room. Discussed increasing Abilify  dosing. Inpatient rehab referral pending. Passive thoughts of death and hopelessness. Denies SI or HI today although patient was reporting both on admission. Gun with friend Aron Lard. Patient at high acute risk for suicide.   Observation Level/Precautions:  15 minute checks  Laboratory:  CBC Chemistry Profile Folic Acid  HbAIC UDS UA Vitamin B-12  Psychotherapy:    Medications:  Zoloft , hydroxyzine , trazodone , Abilify   Consultations:    Discharge Concerns:  rehab referrals  Estimated LOS:4-6 days  Other:      Safety and Monitoring: voluntarily admission to inpatient psychiatric unit for safety, stabilization and treatment Daily contact with patient to assess and evaluate symptoms and progress in treatment Patient's case to be discussed in multi-disciplinary team meeting Observation Level : q15 minute checks Vital signs: q12 hours Precautions: suicide, elopement, and assault   2. Psychiatric Problems #MDD, severe recurrent with psychotic features cont sertraline  50 mg qdaily -increase Abilify  to 15 mg qdaily for mood stabilization, agitation and psychosis   #alcohol use disorder severe without  withdrawal - patient denies history of complicated withdrawals, CIWA negative - maintain on CIWA protocol + PRN Ativan  -will start Naltrexone  50 mg qdaily if above medications are well tolerated     3. Medical Management Covid negative CMP: wnl CBC: unremarkable EtOH: 179 UDS: wnl TSH: wnl A1C: ordered AM draw Lipids: elevated LDL and triglycerides, patient has been noncompliant with statin which was restarted     #Dm2 -cont  metformin  500 mg qddaily   #HLD,  cont simvastatin  10 mg qdaily     Physician Treatment Plan for Primary Diagnosis: MDD (major depressive disorder), recurrent, severe, with psychosis (HCC) Long Term Goal(s): Improvement in symptoms so as ready for discharge   Short Term Goals: Ability to identify changes in lifestyle to reduce recurrence of condition will improve, Ability to verbalize feelings will improve, Ability to disclose and discuss suicidal ideas, Ability to demonstrate self-control will improve, Ability to identify and develop effective coping behaviors will improve, Ability to maintain clinical measurements within normal limits will improve, Compliance with  prescribed medications will improve, and Ability to identify triggers associated with substance abuse/mental health issues will improve   Physician Treatment Plan for Secondary Diagnosis: Principal Problem:   MDD (major depressive disorder), recurrent, severe, with psychosis (HCC) Active Problems:   Alcohol dependence (HCC)     Long Term Goal(s): Improvement in symptoms so as ready for discharge   Short Term Goals: Ability to identify changes in lifestyle to reduce recurrence of condition will improve, Ability to verbalize feelings will improve, Ability to disclose and discuss suicidal ideas, Ability to demonstrate self-control will improve, Ability to identify and develop effective coping behaviors will improve, Ability to maintain clinical measurements within normal limits will improve,  Compliance with prescribed medications will improve, and Ability to identify triggers associated with substance abuse/mental health issues will improve  Rishab Stoudt, MD 04/27/2024, 10:12 AM

## 2024-04-27 NOTE — Progress Notes (Signed)
   04/27/24 1000  Psych Admission Type (Psych Patients Only)  Admission Status Voluntary  Psychosocial Assessment  Patient Complaints Substance abuse;Worrying  Eye Contact Fair  Facial Expression Flat  Affect Anxious;Depressed  Speech Logical/coherent  Interaction Assertive  Motor Activity Slow  Appearance/Hygiene Unremarkable  Behavior Characteristics Appropriate to situation  Mood Depressed;Anxious  Thought Process  Coherency WDL  Content WDL  Delusions None reported or observed  Perception WDL  Hallucination None reported or observed  Judgment Limited  Confusion None  Danger to Self  Current suicidal ideation? Passive  Description of Suicide Plan None  Agreement Not to Harm Self Yes  Description of Agreement Verbal  Danger to Others  Danger to Others None reported or observed

## 2024-04-27 NOTE — [facility] Group Notes (Signed)
 [facility] Group Notes:  (Nursing/MHT/Case Management/Adjunct)  Date:  04/27/2024  Time:  2000  Type of Therapy:  Wrap up group  Participation Level:  Active  Participation Quality:  Appropriate, Attentive, Sharing, and Supportive  Affect:  Appropriate  Cognitive:  Alert  Insight:  Improving  Engagement in Group:  Engaged  Modes of Intervention:  Clarification, Education, and Socialization  Summary of Progress/Problems: Positive thinking and self-care were discussed.   Catharine Clock 04/27/2024, 9:46 PM

## 2024-04-28 MED ORDER — SERTRALINE HCL 50 MG PO TABS
50.0000 mg | ORAL_TABLET | Freq: Every day | ORAL | Status: DC
  Administered 2024-04-29: 50 mg via ORAL
  Filled 2024-04-28: qty 1

## 2024-04-28 MED ORDER — TRAZODONE HCL 100 MG PO TABS
200.0000 mg | ORAL_TABLET | Freq: Every evening | ORAL | Status: DC | PRN
  Administered 2024-04-28 – 2024-04-29 (×2): 200 mg via ORAL
  Filled 2024-04-28 (×2): qty 2

## 2024-04-28 MED ORDER — ARIPIPRAZOLE 15 MG PO TABS
15.0000 mg | ORAL_TABLET | Freq: Every day | ORAL | Status: DC
  Administered 2024-04-29 – 2024-05-01 (×3): 15 mg via ORAL
  Filled 2024-04-28 (×3): qty 1

## 2024-04-28 NOTE — Progress Notes (Signed)
 [facility] MD Progress Note  04/28/2024 5:05 PM Jaquise Faux [name]  MRN:  [id number] Subjective:   Patient is calmer and denies paranoia or AH although notes depression and negative ruminations. Patient has been attending groups and visible on the unit. Reports passive thoughts of death, denies SI or HI today. Patient states he is interested in inpatient rehab. Patient reports waking up at 2 or 3 am last night and thinks it may be Abilify  or Zoloft  and would like to switch to morning dosing. Discussed increasing trazodone  as well and changing to PRN. Patient states he is trying to "block the negative thoughts" that his life situation will not improve.   Principal Problem: MDD (major depressive disorder), recurrent, severe, with psychosis (HCC) Diagnosis: Principal Problem:   MDD (major depressive disorder), recurrent, severe, with psychosis (HCC) Active Problems:   Alcohol dependence (HCC)  Total Time spent with patient: 30 minutes  Past Psychiatric History: see H&P  Past Medical History:  Past Medical History:  Diagnosis Date   Depression    Diabetes mellitus without complication (HCC)    Hypertension    History reviewed. No pertinent surgical history. Family History:  Family History  Problem Relation Age of Onset   Cancer Father    Family Psychiatric  History: see H&P Social History:  Social History   Substance and Sexual Activity  Alcohol Use Yes   Comment: heavy     Social History   Substance and Sexual Activity  Drug Use No    Social History   Socioeconomic History   Marital status: Single    Spouse name: Not on file   Number of children: Not on file   Years of education: Not on file   Highest education level: Not on file  Occupational History   Not on file  Tobacco Use   Smoking status: Every Day    Current packs/day: 0.25    Types: Cigarettes   Smokeless tobacco: Never  Vaping Use   Vaping status: Never Used  Substance and Sexual Activity   Alcohol use:  Yes    Comment: heavy   Drug use: No   Sexual activity: Not on file  Other Topics Concern   Not on file  Social History Narrative   Not on file   Social Drivers of Health   Financial Resource Strain: Not on file  Food Insecurity: No Food Insecurity (04/25/2024)   Hunger Vital Sign    Worried About Running Out of Food in the Last Year: Never true    Ran Out of Food in the Last Year: Never true  Transportation Needs: No Transportation Needs (04/25/2024)   PRAPARE - Administrator, Civil Service (Medical): No    Lack of Transportation (Non-Medical): No  Physical Activity: Not on file  Stress: Not on file  Social Connections: Unknown (08/13/2021)   Received from [facility]   Social Connections    Frequency of Communication with Friends and Family: Not asked    Frequency of Social Gatherings with Friends and Family: Not asked   Additional Social History:                         Sleep: Poor  Appetite:  Fair  Current Medications: Current Facility-Administered Medications  Medication Dose Route Frequency Provider Last Rate Last Admin   acetaminophen  (TYLENOL ) tablet 650 mg  650 mg Oral Q6H PRN Volanda Gruber, NP       alum & Arletta Bender  hydroxide-simeth (MAALOX/MYLANTA) 200-200-20 MG/5ML suspension 30 mL  30 mL Oral Q4H PRN Volanda Gruber, NP   30 mL at 04/26/24 1403   [START ON 04/29/2024] ARIPiprazole  (ABILIFY ) tablet 15 mg  15 mg Oral Daily Shriya Aker, MD       haloperidol  (HALDOL ) tablet 5 mg  5 mg Oral TID PRN Volanda Gruber, NP       And   diphenhydrAMINE  (BENADRYL ) capsule 50 mg  50 mg Oral TID PRN Volanda Gruber, NP       haloperidol  lactate (HALDOL ) injection 5 mg  5 mg Intramuscular TID PRN Volanda Gruber, NP       And   diphenhydrAMINE  (BENADRYL ) injection 50 mg  50 mg Intramuscular TID PRN Volanda Gruber, NP       And   LORazepam  (ATIVAN ) injection 2 mg  2 mg Intramuscular TID PRN Volanda Gruber, NP       haloperidol  lactate (HALDOL )  injection 10 mg  10 mg Intramuscular TID PRN Volanda Gruber, NP       And   diphenhydrAMINE  (BENADRYL ) injection 50 mg  50 mg Intramuscular TID PRN Volanda Gruber, NP       And   LORazepam  (ATIVAN ) injection 2 mg  2 mg Intramuscular TID PRN Volanda Gruber, NP       hydrOXYzine  (ATARAX ) tablet 25 mg  25 mg Oral Q6H PRN Volanda Gruber, NP   25 mg at 04/26/24 2120   loperamide  (IMODIUM ) capsule 2-4 mg  2-4 mg Oral PRN Volanda Gruber, NP       LORazepam  (ATIVAN ) tablet 1 mg  1 mg Oral Q6H PRN Volanda Gruber, NP       magnesium  hydroxide (MILK OF MAGNESIA) suspension 30 mL  30 mL Oral Daily PRN Volanda Gruber, NP       metFORMIN  (GLUCOPHAGE -XR) 24 hr tablet 500 mg  500 mg Oral Q breakfast Aubreyana Saltz, MD   500 mg at 04/28/24 0804   multivitamin with minerals tablet 1 tablet  1 tablet Oral Daily Volanda Gruber, NP   1 tablet at 04/28/24 4098   nicotine  (NICODERM CQ  - dosed in mg/24 hours) patch 21 mg  21 mg Transdermal Q0600 Volanda Gruber, NP       ondansetron  (ZOFRAN -ODT) disintegrating tablet 4 mg  4 mg Oral Q6H PRN Volanda Gruber, NP       Cecily Cohen ON 04/29/2024] sertraline  (ZOLOFT ) tablet 50 mg  50 mg Oral QHS Delories Mauri, MD       simvastatin  (ZOCOR ) tablet 10 mg  10 mg Oral QHS Tarvis Blossom, MD   10 mg at 04/27/24 2123   thiamine  (Vitamin B-1) tablet 100 mg  100 mg Oral Daily Volanda Gruber, NP   100 mg at 04/28/24 1191   thiamine  (VITAMIN B1) injection 100 mg  100 mg Intramuscular Once Volanda Gruber, NP       traZODone  (DESYREL ) tablet 200 mg  200 mg Oral QHS PRN Ruhani Umland, MD        Lab Results:  Results for orders placed or performed during the hospital encounter of 04/25/24 (from the past 48 hours)  Lipid panel     Status: Abnormal   Collection Time: 04/27/24  6:32 AM  Result Value Ref Range   Cholesterol 204 (H) 0 - 200 mg/dL   Triglycerides 478 (H) <150 mg/dL   HDL 34 (L) >29 mg/dL   Total CHOL/HDL Ratio  6.0 RATIO   VLDL 48 (H) 0 - 40 mg/dL   LDL  Cholesterol 409 (H) 0 - 99 mg/dL    Comment:        Total Cholesterol/HDL:CHD Risk Coronary Heart Disease Risk Table                     Men   Women  1/2 Average Risk   3.4   3.3  Average Risk       5.0   4.4  2 X Average Risk   9.6   7.1  3 X Average Risk  23.4   11.0        Use the calculated Patient Ratio above and the CHD Risk Table to determine the patient's CHD Risk.        ATP [name] CLASSIFICATION (LDL):  <100     mg/dL   Optimal  811-914  mg/dL   Near or Above                    Optimal  130-159  mg/dL   Borderline  782-956  mg/dL   High  >213     mg/dL   Very High Performed at [facility], [address], [place], [place] [address]   RPR     Status: None   Collection Time: 04/27/24  6:32 AM  Result Value Ref Range   RPR Ser Ql NON REACTIVE NON REACTIVE    Comment: Performed at [facility], [address], [place], [place] [address]  TSH     Status: None   Collection Time: 04/27/24  6:32 AM  Result Value Ref Range   TSH 1.247 0.350 - 4.500 uIU/mL    Comment: Performed by a 3rd Generation assay with a functional sensitivity of <=0.01 uIU/mL. Performed at [facility], [address] [place], [place] [address]   HIV Antibody (routine testing w rflx)     Status: None   Collection Time: 04/27/24  6:32 AM  Result Value Ref Range   HIV Screen 4th Generation wRfx Non Reactive Non Reactive    Comment: Performed at [facility], [address] [place], [place] [address]    Blood Alcohol level:  Lab Results  Component Value Date   ETH 179 (H) 04/24/2024   ETH 130 (H) 04/20/2024    Metabolic Disorder Labs: Lab Results  Component Value Date   HGBA1C 5.9 (H) 10/10/2023   MPG 122.63 10/10/2023   No results found for: "PROLACTIN" Lab Results  Component Value Date   CHOL 204 (H) 04/27/2024   TRIG 242 (H) 04/27/2024   HDL 34 (L) 04/27/2024   CHOLHDL 6.0 04/27/2024   VLDL 48 (H) 04/27/2024   LDLCALC 122 (H)  04/27/2024   LDLCALC 81 10/07/2023     Musculoskeletal: Strength & Muscle Tone: within normal limits Gait & Station: normal Patient leans: N/A  Psychiatric Specialty Exam:  Presentation  General Appearance:  Disheveled  Eye Contact: Fleeting  Speech: Slow  Speech Volume: Decreased  Handedness: Right   Mood and Affect  Mood: Anxious; Depressed   Affect: Congruent   Thought Process  Thought Processes: Coherent  Descriptions of Associations:Intact  Orientation:Full (Time, Place and Person)  Thought Content: no longer voicing paranoid thoughts  History of Schizophrenia/Schizoaffective disorder:No  Duration of Psychotic Symptoms:N/A  Hallucinations:Hallucinations: None  Ideas of Reference: not voicing ideas of reference  Suicidal Thoughts:Suicidal Thoughts: Yes, Passive  Homicidal Thoughts:Homicidal Thoughts:  No   Sensorium  Memory: Immediate Fair  Judgment: Fair  Insight: Fair   Chartered certified accountant: Fair  Attention Span: Fair  Recall: [employer] of Knowledge: Fair  Language: Fair   Psychomotor Activity  Psychomotor Activity: Psychomotor Activity: Decreased   Assets  Assets: Communication Skills; Desire for Improvement; Physical Health   Sleep  Sleep: Sleep: Poor    Physical Exam: General: Well developed, well nourished, african american gentleman, balding Pupils: Normal at 3mm Respiratory: Breathing is unlabored.  Cardiovascular: No edema.  Language: No anomia, no aphasia Muscle strength and tone-pt moving all extremities.  Gait not assessed as pt remained in bed.  Neuro: Facial muscles are symmetric. Pt without tremor, no evidence of hyperarousal.  Review of Systems  Constitutional: Negative.   HENT: Negative.    Eyes: Negative.   Respiratory: Negative.    Cardiovascular: Negative.   Gastrointestinal: Negative.   Genitourinary: Negative.   Musculoskeletal: Negative.   Skin: Negative.    Neurological: Negative.   Endo/Heme/Allergies: Negative.   Psychiatric/Behavioral:  Positive for depression, substance abuse and suicidal ideas. The patient is nervous/anxious and has insomnia.    Blood pressure 102/67, pulse 76, temperature 98.3 F (36.8 C), temperature source Oral, resp. rate 16, height 5\' 2"  (1.575 m), weight 75 kg, SpO2 99%. Body mass index is 30.24 kg/m.   Treatment Plan Summary: MDD (major depressive disorder), recurrent, severe, with psychosis (HCC) Active Problems:   Alcohol dependence (HCC)    6/6: Patient presenting with worsening depression and suicidal ideation. Had a gun and was thinking about killing himself. States guns is at his friend's Aron Lard. Long history of alcohol abuse. Patient reports he started drinking since April due to multiple psychosocial stressors. Has been more agitated and reporting AH of derogatory voices since April as well and states he has been irritable around other. Patient at high acute risk for suicide.   6/7: presents severely depressed, dysphoric and isolative to room with paranoia and irritability. Tolerating medications and denies AH today but isolative to room. Discussed increasing Abilify  dosing. Inpatient rehab referral pending. Passive thoughts of death and hopelessness. Denies SI or HI today although patient was reporting both on admission. Gun with friend Aron Lard. Patient at high acute risk for suicide.  6/8: improvement in paranoia, anger and irritability. Denies AH today. Denies SI or HI however having passive thoughts of death, negative ruminations, and significant depression. Patient reports insomnia and will increase trazodone  to 200 mg at bedtime-PRN   Observation Level/Precautions:  15 minute checks  Laboratory:  CBC Chemistry Profile Folic Acid  HbAIC UDS UA Vitamin B-12  Psychotherapy:    Medications:  Zoloft , hydroxyzine , trazodone , Abilify   Consultations:    Discharge Concerns:  rehab referrals  Estimated  LOS:4-6 days  Other:      Safety and Monitoring: voluntarily admission to inpatient psychiatric unit for safety, stabilization and treatment Daily contact with patient to assess and evaluate symptoms and progress in treatment Patient's case to be discussed in multi-disciplinary team meeting Observation Level : q15 minute checks Vital signs: q12 hours Precautions: suicide, elopement, and assault   2. Psychiatric Problems #MDD, severe recurrent with psychotic features cont sertraline  50 mg qdaily -cont Abilify  to 15 mg qdaily for mood stabilization, agitation and psychosis - increase trazodone  to 200 mg at bedtime-PRN for insomnia   #alcohol use disorder severe without withdrawal - patient denies history of complicated withdrawals, CIWA negative - maintain on CIWA protocol + PRN Ativan  -will start Naltrexone  50 mg qdaily if  above medications are well tolerated     3. Medical Management Covid negative CMP: wnl CBC: unremarkable EtOH: 179 UDS: wnl TSH: wnl A1C: ordered AM draw Lipids: elevated LDL and triglycerides, patient has been noncompliant with statin which was restarted     #Dm2 -cont  metformin  500 mg qddaily   #HLD,  cont simvastatin  10 mg qdaily     Physician Treatment Plan for Primary Diagnosis: MDD (major depressive disorder), recurrent, severe, with psychosis (HCC) Long Term Goal(s): Improvement in symptoms so as ready for discharge   Short Term Goals: Ability to identify changes in lifestyle to reduce recurrence of condition will improve, Ability to verbalize feelings will improve, Ability to disclose and discuss suicidal ideas, Ability to demonstrate self-control will improve, Ability to identify and develop effective coping behaviors will improve, Ability to maintain clinical measurements within normal limits will improve, Compliance with prescribed medications will improve, and Ability to identify triggers associated with substance abuse/mental health issues  will improve   Physician Treatment Plan for Secondary Diagnosis: Principal Problem:   MDD (major depressive disorder), recurrent, severe, with psychosis (HCC) Active Problems:   Alcohol dependence (HCC)     Long Term Goal(s): Improvement in symptoms so as ready for discharge   Short Term Goals: Ability to identify changes in lifestyle to reduce recurrence of condition will improve, Ability to verbalize feelings will improve, Ability to disclose and discuss suicidal ideas, Ability to demonstrate self-control will improve, Ability to identify and develop effective coping behaviors will improve, Ability to maintain clinical measurements within normal limits will improve, Compliance with prescribed medications will improve, and Ability to identify triggers associated with substance abuse/mental health issues will improve  Tomi Paddock, MD 04/28/2024, 5:05 PM

## 2024-04-28 NOTE — Group Note (Signed)
 Date:  04/28/2024 Time:  5:17 PM  Group Topic/Focus:   Managing Stress:   The focus of this group is to identify ways to manage stress and how patients can apply these strategies in their personal lives.     Participation Level:  Did Not Attend    Sheryl Donna 04/28/2024, 5:17 PM

## 2024-04-28 NOTE — [facility] Suicide Risk Assessment (Signed)
 [facility] INPATIENT:  Family/Significant Other Suicide Prevention Education  Suicide Prevention Education:  Education Completed; with the patient as he denied any friends or family, the patient as the family member/significant other with whom the patient will be residing, and identified as the person(s) who will aid the patient in the event of a mental health crisis (suicidal ideations/suicide attempt).  With written consent from the patient, the family member/significant other has been provided the following suicide prevention education, prior to the and/or following the discharge of the patient.  The suicide prevention education provided includes the following: Suicide risk factors Suicide prevention and interventions National Suicide Hotline telephone number [facility] assessment telephone number [facility] Emergency Assistance 911 [facility] and/or Residential Mobile Crisis Unit telephone number  Request made of family/significant other to: Remove weapons (e.g., guns, rifles, knives), all items previously/currently identified as safety concern.   Remove drugs/medications (over-the-counter, prescriptions, illicit drugs), all items previously/currently identified as a safety concern.  PATIENT REPORTS GUN BEING WITH A FRIEND AND STATED THAT HE DOES NOT HAVE ACCESS   The family member/significant other verbalizes understanding of the suicide prevention education information provided.  The family member/significant other agrees to remove the items of safety concern listed above.  Aaron [name] Aaron [name] 04/28/2024, 2:56 PM

## 2024-04-28 NOTE — Progress Notes (Signed)
   04/27/24 2055  Psych Admission Type (Psych Patients Only)  Admission Status Voluntary  Psychosocial Assessment  Patient Complaints Depression  Eye Contact Poor  Facial Expression Flat  Affect Depressed;Sad  Speech Logical/coherent  Motor Activity Other (Comment) (WNL)  Appearance/Hygiene UTA  Behavior Characteristics Appropriate to situation  Mood Depressed;Pleasant  Thought Process  Coherency WDL  Content WDL  Delusions None reported or observed  Perception WDL  Hallucination None reported or observed  Judgment Poor  Confusion None  Danger to Self  Current suicidal ideation? Passive  Description of Suicide Plan Pt Denied  Agreement Not to Harm Self Yes  Description of Agreement Notify Staff  Danger to Others  Danger to Others None reported or observed

## 2024-04-28 NOTE — Group Note (Signed)
 Date:  04/28/2024 Time:  10:16 AM  Group Topic/Focus:  Goals Group:   The focus of this group is to help patients establish daily goals to achieve during treatment and discuss how the patient can incorporate goal setting into their daily lives to aide in recovery. Orientation:   The focus of this group is to educate the patient on the purpose and policies of crisis stabilization and provide a format to answer questions about their admission.  The group details unit policies and expectations of patients while admitted.    Participation Level:  Active  Participation Quality:  Appropriate  Affect:  Appropriate  Cognitive:  Appropriate  Insight: Appropriate  Engagement in Group:  Developing/Improving  Modes of Intervention:  Discussion and Orientation  Additional Comments:    Aaron [name] Aaron [name] 04/28/2024, 10:16 AM

## 2024-04-28 NOTE — Plan of Care (Signed)
  Problem: Activity: Goal: Interest or engagement in activities will improve Outcome: Progressing Goal: Sleeping patterns will improve Outcome: Progressing   Problem: Education: Goal: Ability to make informed decisions regarding treatment will improve Outcome: Progressing   Problem: Safety: Goal: Periods of time without injury will increase Outcome: Progressing

## 2024-04-28 NOTE — [facility] Counselor (Signed)
 Adult Comprehensive Assessment  Patient ID: Aaron [name], male   DOB: 03-Aug-1976, 48 y.o.   MRN: [id number]  Information Source: Information source: Patient  Current Stressors:  Patient states their primary concerns and needs for treatment are:: Patient would like LT treatment for Alcohol Patient states their goals for this hospitilization and ongoing recovery are:: Patient really wants help with the Alcohol as well getting his lie together Educational / Learning stressors: No Employment / Job issues: Patient lost his job on May 1st Family Relationships: Patient has no family in [place] had a girlfriend/ from [employer] / Lack of resources (include bankruptcy): Yes Housing / Lack of housing: Yes, however patient can go to mom's or some family. Physical health (include injuries & life threatening diseases): NO Social relationships: Yes Substance abuse: Alcohol Bereavement / Loss: No  Living/Environment/Situation:  Living Arrangements: Other (Comment) (Currenty homeless has friends) Living conditions (as described by patient or guardian): Patient still states that he stills has apartment however stated that he is months behind on rent. Who else lives in the home?: Girlfriend lived with the patient however is currently in jail. How long has patient lived in current situation?: Patient states he lived in this apartment What is atmosphere in current home: Loving, Comfortable  Family History:  Marital status: Long term relationship Long term relationship, how long?: 5 years What types of issues is patient dealing with in the relationship?: Patient states that the he knows many  reasons that his lover would have issues with him however she nags him alot Additional relationship information: N/A Are you sexually active?: Yes What is your sexual orientation?: Straight Has your sexual activity been affected by drugs, alcohol, medication, or emotional stress?: Pateint states that not  really but has been times when he has had intentions however he was to drunk too. Does patient have children?: Yes How many children?: 1 How is patient's relationship with their children?: Pt did not respond when asked.  Childhood History:  By whom was/is the patient raised?: Both parents Additional childhood history information: Father dealt with drugs addiction Description of patient's relationship with caregiver when they were a child: Basic upbringing, loving relationship with both parents Patient's description of current relationship with people who raised him/her: Father died,patient does get along with his moher How were you disciplined when you got in trouble as a child/adolescent?: Not abused, disciplined appropriately Does patient have siblings?: Yes Number of Siblings: 2 Description of patient's current relationship with siblings: Patient states that both brothers at this time have thier life in order. Did patient suffer any verbal/emotional/physical/sexual abuse as a child?: No Did patient suffer from severe childhood neglect?: No Has patient ever been sexually abused/assaulted/raped as an adolescent or adult?: No Was the patient ever a victim of a crime or a disaster?: No Witnessed domestic violence?: No Has patient been affected by domestic violence as an adult?: No  Education:  Highest grade of school patient has completed: GED/ some college Currently a student?: No Learning disability?: No  Employment/Work Situation:   Employment Situation: Unemployed Patient's Job has Been Impacted by Current Illness: Yes (Patient has seen the bad effects on his life due to drinking) Describe how Patient's Job has Been Impacted: Patient was fired however states he was sober at this time. 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?: No  Financial Resources:  Financial  resources: No income Does patient have a representative payee or guardian?: No  Alcohol/Substance Abuse:   What has been your use of drugs/alcohol within the last 12 months?: Alcohol has been very bad for me. If attempted suicide, did drugs/alcohol play a role in this?: Yes (Alcohol was the reason for patient attemping sucide with a Gun.) Alcohol/Substance Abuse Treatment Hx: Attends AA/NA If yes, describe treatment: Sober living Has alcohol/substance abuse ever caused legal problems?: Yes (DUI/ Assult and Battery)  Social Support System:   Patient's Community Support System: Fair Development worker, community Support System: N/A Type of faith/religion: Lynder Sanger How does patient's faith help to cope with current illness?: patient pray's daily  Leisure/Recreation:   Do You Have Hobbies?: No  Strengths/Needs:   What is the patient's perception of their strengths?: Determined/ acknowledgable Patient states they can use these personal strengths during their treatment to contribute to their recovery: Pateint feels like these quailties will help him manintain soberty however he also feelsl like he always fails when Patient states these barriers may affect/interfere with their treatment: life/ just things that are going wrong Patient states these barriers may affect their return to the community: People around his friends bad influencs Other important information patient would like considered in planning for their treatment: Pateint really wants IP treatment. Pateint wants to get his life together.  Discharge Plan:   Currently receiving community mental health services: No Patient states concerns and preferences for aftercare planning are: Patient states that he would like to attend a IP treatment/ Patient states they will know when they are safe and ready for discharge when: patient feels like his head is clearer and more acceptence he feels like he is ready to change and attend this program.... Does  patient have access to transportation?: No (Patient woud need  Taxi) Does patient have financial barriers related to discharge medications?: No Patient description of barriers related to discharge medications: Patient will need help to obtain medication.. Plan for no access to transportation at discharge: N/A Will patient be returning to same living situation after discharge?: Yes (Patient is not sure having issses paying rent needs help with that.)  Summary/Recommendations:   Summary and Recommendations (to be completed by the evaluator): Aaron [name] is a 48 year old male who presents voluntary and unaccompanied to [facility]. Clinician asked the pt, "what brought you to the hospital?" Pt reports, not been good for the past couple of days. Pt reports, he feels useless, fucked up; but is unable to identify triggers of his feelings. Pt reports, he feels suicidal but when asked if he has a plan he replied "I don't know what you will call it." Pt reports, hearing voices telling him to do certain things, hearing his dads and the [facility] voice. Pt reports, access to weapons (knives and .9mm). Patient was very expressive in his issues with Alcohol. Patient also acknowledges that alcohol has afffected his life to the point that he has lost everything. He also stated that his family is around but very judgemental towards his addiction. Patient states he needs this IP program to help him gather more tools to maintain soberty. Patient also stated that he knows there are certain people that does not need to be in his life including his girlfriend of 5 years. CSW will leave information for CSW during the week to assit with possible IP placement for SUD.  Aaron [name]. 04/28/2024

## 2024-04-28 NOTE — [facility] Group Notes (Signed)
 [facility] Group Notes:  (Nursing/MHT/Case Management/Adjunct)  Date:  04/28/2024  Time:  2000  Type of Therapy:  Wrap up group  Participation Level:  Active  Participation Quality:  Appropriate, Attentive, Sharing, and Supportive  Affect:  Appropriate  Cognitive:  Alert  Insight:  Improving  Engagement in Group:  Engaged  Modes of Intervention:  Clarification, Education, and Support  Summary of Progress/Problems: Positive thinking and positive change were discussed.   Aaron [name] 04/28/2024, 9:44 PM

## 2024-04-28 NOTE — Group Note (Signed)
 LCSW Group Therapy Note  Group Date: 04/28/2024 Start Time: 1030 End Time: 1200   Type of Therapy and Topic:  Soothing Skills  Participation Level:  Active   Description of Group The focus of this group was to determine what unhealthy coping techniques typically are used by group members and what healthy coping techniques would be helpful in coping with various problems. Patients were guided in becoming aware of the differences between healthy and unhealthy coping techniques. Patients were asked to identify 2-3 healthy coping skills they would like to learn to use more effectively.  Therapeutic Goals Patients learned that coping is what human beings do all day long to deal with various situations in their lives Patients defined and discussed healthy vs unhealthy coping techniques Patients identified their preferred coping techniques and identified whether these were healthy or unhealthy Patients determined 2-3 healthy coping skills they would like to become more familiar with and use more often. Patients provided support and ideas to each other   Summary of Patient Progress:   Patient was very quiet through group however was very attentive and stayed the whole time.    Therapeutic Modalities Cognitive Behavioral Therapy Motivational Interviewing  Mariano Shiver, LCSWA 04/28/2024  1:01 PM

## 2024-04-28 NOTE — Plan of Care (Signed)
  Problem: Education: Goal: Knowledge of [facility] information/materials will improve Outcome: Progressing Goal: Emotional status will improve Outcome: Progressing Goal: Mental status will improve Outcome: Progressing   Problem: Activity: Goal: Sleeping patterns will improve Outcome: Progressing   Problem: Coping: Goal: Ability to verbalize frustrations and anger appropriately will improve Outcome: Progressing Goal: Ability to demonstrate self-control will improve Outcome: Progressing   Problem: Health Behavior/Discharge Planning: Goal: Identification of resources available to assist in meeting health care needs will improve Outcome: Progressing Goal: Compliance with treatment plan for underlying cause of condition will improve Outcome: Progressing   Problem: Physical Regulation: Goal: Ability to maintain clinical measurements within normal limits will improve Outcome: Progressing   Problem: Safety: Goal: Periods of time without injury will increase Outcome: Progressing   Problem: Education: Goal: Ability to state activities that reduce stress will improve Outcome: Progressing   Problem: Education: Goal: Utilization of techniques to improve thought processes will improve Outcome: Progressing   Problem: Education: Goal: Knowledge of disease or condition will improve Outcome: Progressing Goal: Understanding of discharge needs will improve Outcome: Progressing   Problem: Education: Goal: Ability to state activities that reduce stress will improve Outcome: Progressing

## 2024-04-28 NOTE — Progress Notes (Signed)
   04/28/24 1610  Psych Admission Type (Psych Patients Only)  Admission Status Voluntary  Psychosocial Assessment  Patient Complaints Depression;Anxiety  Eye Contact Fair  Facial Expression Flat  Affect Depressed;Anxious  Speech Logical/coherent  Interaction Minimal  Motor Activity Slow  Appearance/Hygiene Unremarkable  Behavior Characteristics Appropriate to situation  Mood Depressed;Anxious  Thought Process  Coherency WDL  Content WDL  Delusions None reported or observed  Perception WDL  Hallucination None reported or observed  Judgment Limited  Confusion None  Danger to Self  Current suicidal ideation? Denies  Agreement Not to Harm Self Yes  Description of Agreement Verbal  Danger to Others  Danger to Others None reported or observed

## 2024-04-29 LAB — HEMOGLOBIN A1C
Hgb A1c MFr Bld: 5.6 % (ref 4.8–5.6)
Mean Plasma Glucose: 114 mg/dL

## 2024-04-29 MED ORDER — NALTREXONE HCL 50 MG PO TABS
50.0000 mg | ORAL_TABLET | Freq: Every day | ORAL | Status: DC
  Administered 2024-04-30 – 2024-05-01 (×2): 50 mg via ORAL
  Filled 2024-04-29 (×2): qty 1

## 2024-04-29 NOTE — Group Note (Signed)
 Recreation Therapy Group Note   Group Topic:Stress Management  Group Date: 04/29/2024 Start Time: 0932 End Time: 1058 Facilitators: Zion Lint-McCall, LRT,CTRS Location: [address] Dayroom   Group Topic: Stress Management  Goal Area(s) Addresses:  Patient will identify positive stress management techniques. Patient will identify benefits of using stress management post d/c.  Behavioral Response: Engaged  Intervention: Calm App  Activity: Meditation. LRT played a meditation for patients called the [facility] meditation. This meditation focused on taking the strength and resilience that mountains represent and bringing them into your daily life.    Education:  Stress Management, Discharge Planning.   Education Outcome: Acknowledges Education   Affect/Mood: Appropriate   Participation Level: Engaged   Participation Quality: Independent   Behavior: Appropriate   Speech/Thought Process: Focused   Insight: Good   Judgement: Good   Modes of Intervention: Meditation   Patient Response to Interventions:  Engaged   Education Outcome:  In group clarification offered    Clinical Observations/Individualized Feedback: Pt attended and participated in group.     Plan: Continue to engage patient in RT group sessions 2-3x/week.   Jerelle Virden-McCall, LRT,CTRS 04/29/2024 1:03 PM

## 2024-04-29 NOTE — Plan of Care (Signed)

## 2024-04-29 NOTE — Progress Notes (Signed)
 [facility] MD Progress Note  04/29/2024 7:18 PM Aaron [name]  MRN:  [id number] Subjective:   Patient is calmer and notes paranoia and AVH are resolved. Denies SI or HI today. Patient is more visible on the unit and attending groups. Future oriented on rehab referrals. Patient would like to be discharged on Wednesday. Reports depression and negative ruminations however states the medications are helping. He is agreeable to starting naltrexone  for alcohol cessation.. Patient has been attending groups and visible on the unit. Denies wanting to be dead or to not wake up. States he slept better with trazodone  200 mg at bedtime last night.   Principal Problem: MDD (major depressive disorder), recurrent, severe, with psychosis (HCC) Diagnosis: Principal Problem:   MDD (major depressive disorder), recurrent, severe, with psychosis (HCC) Active Problems:   Alcohol dependence (HCC)  Total Time spent with patient: 30 minutes  Past Psychiatric History: see H&P  Past Medical History:  Past Medical History:  Diagnosis Date   Depression    Diabetes mellitus without complication (HCC)    Hypertension    History reviewed. No pertinent surgical history. Family History:  Family History  Problem Relation Age of Onset   Cancer Father    Family Psychiatric  History: see H&P Social History:  Social History   Substance and Sexual Activity  Alcohol Use Yes   Comment: heavy     Social History   Substance and Sexual Activity  Drug Use No    Social History   Socioeconomic History   Marital status: Single    Spouse name: Not on file   Number of children: Not on file   Years of education: Not on file   Highest education level: Not on file  Occupational History   Not on file  Tobacco Use   Smoking status: Every Day    Current packs/day: 0.25    Types: Cigarettes   Smokeless tobacco: Never  Vaping Use   Vaping status: Never Used  Substance and Sexual Activity   Alcohol use: Yes     Comment: heavy   Drug use: No   Sexual activity: Not on file  Other Topics Concern   Not on file  Social History Narrative   Not on file   Social Drivers of Health   Financial Resource Strain: Not on file  Food Insecurity: No Food Insecurity (04/25/2024)   Hunger Vital Sign    Worried About Running Out of Food in the Last Year: Never true    Ran Out of Food in the Last Year: Never true  Transportation Needs: No Transportation Needs (04/25/2024)   PRAPARE - Administrator, Civil Service (Medical): No    Lack of Transportation (Non-Medical): No  Physical Activity: Not on file  Stress: Not on file  Social Connections: Unknown (08/13/2021)   Received from [facility]   Social Connections    Frequency of Communication with Friends and Family: Not asked    Frequency of Social Gatherings with Friends and Family: Not asked   Additional Social History:                         Sleep: Poor  Appetite:  Fair  Current Medications: Current Facility-Administered Medications  Medication Dose Route Frequency Provider Last Rate Last Admin   acetaminophen  (TYLENOL ) tablet 650 mg  650 mg Oral Q6H PRN Volanda Gruber, NP       alum & mag hydroxide-simeth (MAALOX/MYLANTA) 200-200-20 MG/5ML suspension  30 mL  30 mL Oral Q4H PRN Volanda Gruber, NP   30 mL at 04/28/24 2038   ARIPiprazole  (ABILIFY ) tablet 15 mg  15 mg Oral Daily Chantale Leugers, MD   15 mg at 04/29/24 1610   haloperidol  (HALDOL ) tablet 5 mg  5 mg Oral TID PRN Volanda Gruber, NP       And   diphenhydrAMINE  (BENADRYL ) capsule 50 mg  50 mg Oral TID PRN Volanda Gruber, NP       haloperidol  lactate (HALDOL ) injection 5 mg  5 mg Intramuscular TID PRN Volanda Gruber, NP       And   diphenhydrAMINE  (BENADRYL ) injection 50 mg  50 mg Intramuscular TID PRN Volanda Gruber, NP       And   LORazepam  (ATIVAN ) injection 2 mg  2 mg Intramuscular TID PRN Volanda Gruber, NP       haloperidol  lactate (HALDOL ) injection  10 mg  10 mg Intramuscular TID PRN Volanda Gruber, NP       And   diphenhydrAMINE  (BENADRYL ) injection 50 mg  50 mg Intramuscular TID PRN Volanda Gruber, NP       And   LORazepam  (ATIVAN ) injection 2 mg  2 mg Intramuscular TID PRN Volanda Gruber, NP       magnesium  hydroxide (MILK OF MAGNESIA) suspension 30 mL  30 mL Oral Daily PRN Volanda Gruber, NP       metFORMIN  (GLUCOPHAGE -XR) 24 hr tablet 500 mg  500 mg Oral Q breakfast Izrael Peak, MD   500 mg at 04/29/24 9604   multivitamin with minerals tablet 1 tablet  1 tablet Oral Daily Volanda Gruber, NP   1 tablet at 04/29/24 5409   nicotine  (NICODERM CQ  - dosed in mg/24 hours) patch 21 mg  21 mg Transdermal Q0600 Volanda Gruber, NP       sertraline  (ZOLOFT ) tablet 50 mg  50 mg Oral QHS Adrion Menz, MD       simvastatin  (ZOCOR ) tablet 10 mg  10 mg Oral QHS Sheba Whaling, MD   10 mg at 04/28/24 2222   thiamine  (Vitamin B-1) tablet 100 mg  100 mg Oral Daily Volanda Gruber, NP   100 mg at 04/29/24 8119   thiamine  (VITAMIN B1) injection 100 mg  100 mg Intramuscular Once Volanda Gruber, NP       traZODone  (DESYREL ) tablet 200 mg  200 mg Oral QHS PRN Jerrard Bradburn, MD   200 mg at 04/28/24 2221    Lab Results:  No results found for this or any previous visit (from the past 48 hours).   Blood Alcohol level:  Lab Results  Component Value Date   ETH 179 (H) 04/24/2024   ETH 130 (H) 04/20/2024    Metabolic Disorder Labs: Lab Results  Component Value Date   HGBA1C 5.6 04/27/2024   MPG 114 04/27/2024   MPG 122.63 10/10/2023   No results found for: "PROLACTIN" Lab Results  Component Value Date   CHOL 204 (H) 04/27/2024   TRIG 242 (H) 04/27/2024   HDL 34 (L) 04/27/2024   CHOLHDL 6.0 04/27/2024   VLDL 48 (H) 04/27/2024   LDLCALC 122 (H) 04/27/2024   LDLCALC 81 10/07/2023     Musculoskeletal: Strength & Muscle Tone: within normal limits Gait & Station: normal Patient leans: N/A  Psychiatric Specialty  Exam:  Presentation  General Appearance:  Disheveled  Eye Contact: Fleeting  Speech: Slow  Speech Volume:  Decreased  Handedness: Right   Mood and Affect  Mood: Anxious; Depressed   Affect: Congruent   Thought Process  Thought Processes: Coherent  Descriptions of Associations:Intact  Orientation:Full (Time, Place and Person)  Thought Content: denies paranoia  History of Schizophrenia/Schizoaffective disorder:No  Duration of Psychotic Symptoms:N/A  Hallucinations: denies AVH  Ideas of Reference: no longer voicing ideas of reference  Suicidal Thoughts: denies Homicidal Thoughts: denies   Sensorium  Memory: Immediate Fair  Judgment: Fair  Insight: Fair   Art therapist  Concentration: Fair  Attention Span: Fair  Recall: [employer] of Knowledge: Fair  Language: Fair   Psychomotor Activity  Psychomotor Activity: No data recorded   Assets  Assets: Communication Skills; Desire for Improvement; Physical Health   Sleep  Sleep: 7 hours    Physical Exam: General: Well developed, well nourished, african american gentleman, balding Pupils: Normal at 3mm Respiratory: Breathing is unlabored.  Cardiovascular: No edema.  Language: No anomia, no aphasia Muscle strength and tone-pt moving all extremities.  Gait not assessed as pt remained in bed.  Neuro: Facial muscles are symmetric. Pt without tremor, no evidence of hyperarousal.  Review of Systems  Constitutional: Negative.   HENT: Negative.    Eyes: Negative.   Respiratory: Negative.    Cardiovascular: Negative.   Gastrointestinal: Negative.   Genitourinary: Negative.   Musculoskeletal: Negative.   Skin: Negative.   Neurological: Negative.   Endo/Heme/Allergies: Negative.   Psychiatric/Behavioral:  Positive for depression and substance abuse. Negative for hallucinations and suicidal ideas (denies). The patient is nervous/anxious. The patient does not have insomnia.     Blood pressure 110/84, pulse 70, temperature 98.2 F (36.8 C), temperature source Oral, resp. rate 16, height 5\' 2"  (1.575 m), weight 75 kg, SpO2 97%. Body mass index is 30.24 kg/m.   Treatment Plan Summary: MDD (major depressive disorder), recurrent, severe, with psychosis (HCC) Active Problems:   Alcohol dependence (HCC)    6/6: Patient presenting with worsening depression and suicidal ideation. Had a gun and was thinking about killing himself. States guns is at his friend's Aron Lard. Long history of alcohol abuse. Patient reports he started drinking since April due to multiple psychosocial stressors. Has been more agitated and reporting AH of derogatory voices since April as well and states he has been irritable around other. Patient at high acute risk for suicide.   6/7: presents severely depressed, dysphoric and isolative to room with paranoia and irritability. Tolerating medications and denies AH today but isolative to room. Discussed increasing Abilify  dosing. Inpatient rehab referral pending. Passive thoughts of death and hopelessness. Denies SI or HI today although patient was reporting both on admission. Gun with friend Aron Lard. Patient at high acute risk for suicide.  6/8: improvement in paranoia, anger and irritability. Denies AH today. Denies SI or HI however having passive thoughts of death, negative ruminations, and significant depression. Patient reports insomnia and will increase trazodone  to 200 mg at bedtime-PRN  6/9: Patient presenting with depression but is no longer suicidal. Interested in inpatient rehab. Will initiate 50 mg naltrexone  for alcohol cessation. Plan for discharge on Wednesday either to friends house or residential rehab.   Observation Level/Precautions:  15 minute checks  Laboratory:  CBC Chemistry Profile Folic Acid  HbAIC UDS UA Vitamin B-12  Psychotherapy:    Medications:  Zoloft , hydroxyzine , trazodone , Abilify   Consultations:    Discharge  Concerns:  rehab referrals  Estimated LOS:4-6 days  Other:      Safety and Monitoring: voluntarily admission to inpatient psychiatric  unit for safety, stabilization and treatment Daily contact with patient to assess and evaluate symptoms and progress in treatment Patient's case to be discussed in multi-disciplinary team meeting Observation Level : q15 minute checks Vital signs: q12 hours Precautions: suicide, elopement, and assault   2. Psychiatric Problems #MDD, severe recurrent with psychotic features cont sertraline  50 mg qdaily -cont Abilify  to 15 mg qdaily for mood stabilization, agitation and psychosis - cont trazodone  to 200 mg at bedtime-PRN for insomnia   #alcohol use disorder severe without withdrawal - patient denies history of complicated withdrawals, CIWA negative - maintain on CIWA protocol + PRN Ativan  -start Naltrexone  50 mg qdaily    3. Medical Management Covid negative CMP: wnl CBC: unremarkable EtOH: 179 UDS: wnl TSH: wnl A1C: ordered AM draw Lipids: elevated LDL and triglycerides, patient has been noncompliant with statin which was restarted     #Dm2 -cont  metformin  500 mg qddaily   #HLD,  cont simvastatin  10 mg qdaily     Physician Treatment Plan for Primary Diagnosis: MDD (major depressive disorder), recurrent, severe, with psychosis (HCC) Long Term Goal(s): Improvement in symptoms so as ready for discharge   Short Term Goals: Ability to identify changes in lifestyle to reduce recurrence of condition will improve, Ability to verbalize feelings will improve, Ability to disclose and discuss suicidal ideas, Ability to demonstrate self-control will improve, Ability to identify and develop effective coping behaviors will improve, Ability to maintain clinical measurements within normal limits will improve, Compliance with prescribed medications will improve, and Ability to identify triggers associated with substance abuse/mental health issues will  improve   Physician Treatment Plan for Secondary Diagnosis: Principal Problem:   MDD (major depressive disorder), recurrent, severe, with psychosis (HCC) Active Problems:   Alcohol dependence (HCC)     Long Term Goal(s): Improvement in symptoms so as ready for discharge   Short Term Goals: Ability to identify changes in lifestyle to reduce recurrence of condition will improve, Ability to verbalize feelings will improve, Ability to disclose and discuss suicidal ideas, Ability to demonstrate self-control will improve, Ability to identify and develop effective coping behaviors will improve, Ability to maintain clinical measurements within normal limits will improve, Compliance with prescribed medications will improve, and Ability to identify triggers associated with substance abuse/mental health issues will improve  Izela Altier, MD 04/29/2024, 7:18 PM

## 2024-04-29 NOTE — Plan of Care (Signed)
   Problem: Education: Goal: Emotional status will improve Outcome: Progressing Goal: Mental status will improve Outcome: Progressing

## 2024-04-29 NOTE — Progress Notes (Signed)
   04/29/24 0800  Psych Admission Type (Psych Patients Only)  Admission Status Voluntary  Psychosocial Assessment  Patient Complaints Anxiety  Eye Contact Fair  Facial Expression Animated  Affect Appropriate to circumstance  Speech Logical/coherent  Interaction Assertive  Motor Activity Slow  Appearance/Hygiene Unremarkable  Behavior Characteristics Appropriate to situation;Cooperative  Mood Pleasant  Thought Process  Coherency WDL  Content WDL  Delusions None reported or observed  Perception WDL  Hallucination None reported or observed  Judgment Limited  Confusion None  Danger to Self  Current suicidal ideation? Denies  Self-Injurious Behavior No self-injurious ideation or behavior indicators observed or expressed   Agreement Not to Harm Self Yes  Description of Agreement Verbal Contract  Danger to Others  Danger to Others None reported or observed

## 2024-04-29 NOTE — Plan of Care (Signed)
  Problem: Education: Goal: Emotional status will improve Outcome: Progressing   Problem: Health Behavior/Discharge Planning: Goal: Identification of resources available to assist in meeting health care needs will improve Outcome: Progressing

## 2024-04-29 NOTE — Progress Notes (Signed)
  Aaron [name]   Type of Note: Substance Use Tx/Discharge planning  [facility] referral sent. Pt given phone number to compete phone screen this afternoon.  Pt stated if unable to get into residential treatment, he would like to discharge to a family members house.   Will continue to assist as needed.  Signed:  Apphia Cropley, LCSW-A 04/29/2024  11:27 AM

## 2024-04-29 NOTE — Progress Notes (Signed)
   04/28/24 2300  Psych Admission Type (Psych Patients Only)  Admission Status Voluntary  Psychosocial Assessment  Patient Complaints Anxiety  Eye Contact Fair  Facial Expression Animated  Affect Appropriate to circumstance  Speech Logical/coherent  Interaction Assertive  Motor Activity Slow  Appearance/Hygiene Unremarkable  Behavior Characteristics Appropriate to situation;Cooperative  Mood Pleasant  Thought Process  Coherency WDL  Content WDL  Delusions None reported or observed  Perception WDL  Hallucination None reported or observed  Judgment Limited  Confusion None  Danger to Self  Current suicidal ideation? Denies  Description of Suicide Plan none  Self-Injurious Behavior No self-injurious ideation or behavior indicators observed or expressed   Agreement Not to Harm Self Yes  Description of Agreement verbal  Danger to Others  Danger to Others None reported or observed

## 2024-04-29 NOTE — Progress Notes (Signed)
   04/29/24 2345  Psych Admission Type (Psych Patients Only)  Admission Status Voluntary  Psychosocial Assessment  Patient Complaints Anxiety  Eye Contact Fair  Facial Expression Sad  Affect Appropriate to circumstance  Speech Logical/coherent  Interaction Assertive  Motor Activity Slow  Appearance/Hygiene Unremarkable  Behavior Characteristics Appropriate to situation  Mood Pleasant  Aggressive Behavior  Effect No apparent injury  Thought Process  Coherency WDL  Content WDL  Delusions WDL  Perception WDL  Hallucination None reported or observed  Judgment Limited  Confusion None  Danger to Self  Current suicidal ideation? Denies  Danger to Others  Danger to Others None reported or observed

## 2024-04-29 NOTE — Group Note (Signed)
 Date:  04/29/2024 Time:  9:04 AM  Group Topic/Focus:  Goals Group:   The focus of this group is to help patients establish daily goals to achieve during treatment and discuss how the patient can incorporate goal setting into their daily lives to aide in recovery.    Participation Level:  Active  Participation Quality:  Appropriate  Affect:  Appropriate   Ellan Gunner 04/29/2024, 9:04 AM

## 2024-04-30 MED ORDER — SERTRALINE HCL 100 MG PO TABS
100.0000 mg | ORAL_TABLET | Freq: Every day | ORAL | Status: DC
  Administered 2024-04-30: 100 mg via ORAL
  Filled 2024-04-30: qty 1

## 2024-04-30 NOTE — Group Note (Signed)
 Recreation Therapy Group Note   Group Topic:Animal Assisted Therapy   Group Date: 04/30/2024 Start Time: 0946 End Time: 1030 Facilitators: Shaunak Kreis-McCall, LRT,CTRS Location: [address] Dayroom   Animal-Assisted Activity (AAA) Program Checklist/Progress Notes Patient Eligibility Criteria Checklist & Daily Group note for Rec Tx Intervention  AAA/T Program Assumption of Risk Form signed by Patient/ or Parent Legal Guardian Yes  Patient is free of allergies or severe asthma Yes  Patient reports no fear of animals Yes  Patient reports no history of cruelty to animals Yes  Patient understands his/her participation is voluntary Yes  Patient washes hands before animal contact Yes  Patient washes hands after animal contact Yes  Behavioral Response: Attentive   Education: Hand Washing, Appropriate Animal Interaction   Education Outcome: Acknowledges education.    Affect/Mood: Appropriate   Participation Level: Moderate   Participation Quality: Independent   Behavior: Attentive    Speech/Thought Process: Relevant   Insight: None   Judgement: None   Modes of Intervention: Teaching laboratory technician   Patient Response to Interventions:  Attentive   Education Outcome:  In group clarification offered    Clinical Observations/Individualized Feedback: Pt was quiet and mostly observant. Pt had some interaction with therapy dog team. Pt was appropriate during group session.    Plan: Continue to engage patient in RT group sessions 2-3x/week.   Bhakti Labella-McCall, LRT,CTRS  04/30/2024 1:03 PM

## 2024-04-30 NOTE — Progress Notes (Signed)
   04/30/24 0810  Psych Admission Type (Psych Patients Only)  Admission Status Voluntary  Psychosocial Assessment  Patient Complaints Anxiety  Eye Contact Fair  Facial Expression Sad  Affect Appropriate to circumstance  Speech Logical/coherent  Interaction Assertive  Motor Activity Slow  Appearance/Hygiene Unremarkable  Behavior Characteristics Appropriate to situation  Mood Pleasant  Thought Process  Coherency WDL  Content WDL  Delusions None reported or observed  Perception WDL  Hallucination None reported or observed  Judgment Limited  Confusion None  Danger to Self  Current suicidal ideation? Denies  Agreement Not to Harm Self Yes  Description of Agreement verbal  Danger to Others  Danger to Others None reported or observed

## 2024-04-30 NOTE — Group Note (Signed)
 LCSW Group Therapy Note   Group Date: 04/30/2024 Start Time: 1100 End Time: 1200   Participation:  patient was present for 50% of the group session.  He listened but didn't participate in the discussion.  Type of Therapy:  Group Therapy  Topic:  Speaking from the Heart: Communicating with Understanding and Empathy  Objective:  To help participants develop effective communication skills to express themselves clearly, listen actively, and navigate conflicts in a healthy way.  Goals: Increase awareness of verbal and non-verbal communication skills. Practice using "I" statements and active listening techniques. Learn coping strategies for managing communication stress.  Summary:  Participants explored the importance of communication, discussed challenges, and practiced skills such as active listening and assertive expression. They reflected on past experiences and identified ways to improve communication in their daily lives.  Therapeutic Modalities: Cognitive-Behavioral Therapy (CBT): Restructuring negative thought patterns in communication. Mindfulness: Staying present and calm during conversations. Psychoeducation: Learning about effective communication techniques.   Jeily Guthridge O Reyaansh Merlo, LCSWA 04/30/2024  5:49 PM

## 2024-04-30 NOTE — Group Note (Signed)
 Date:  04/30/2024 Time:  9:04 AM  Group Topic/Focus:  Goals Group:   The focus of this group is to help patients establish daily goals to achieve during treatment and discuss how the patient can incorporate goal setting into their daily lives to aide in recovery. Orientation:   The focus of this group is to educate the patient on the purpose and policies of crisis stabilization and provide a format to answer questions about their admission.  The group details unit policies and expectations of patients while admitted.    Participation Level:  Did Not Attend

## 2024-04-30 NOTE — Progress Notes (Signed)
 [facility] MD Progress Note  04/30/2024 11:28 AM Aaron [name]  MRN:  [id number] Subjective:   Patient is calmer and no longer exhibiting any symptom of psychosis. Attending groups and interactive with peers. Per case management patient has court date thus cannot go to [facility] will need to discharge to shelter. Denies SI or HI today. Reports residual depression. He is tolerating naltrexone  well. Patient denies wanting to be dead and denies wanting to hurt anyone. States he does not have access to gun and gave it to his friend Aaron [name] who owns the gun. Patient does not have Fred's telephone number although states "The gun is not mine." Patient states his mood has improved since admission and denies side effects from medication.   Future oriented on rehab referrals. Patient would like to be discharged on 2024/05/23. Denies wanting to be dead or to not wake up.  Principal Problem: MDD (major depressive disorder), recurrent, severe, with psychosis (HCC) Diagnosis: Principal Problem:   MDD (major depressive disorder), recurrent, severe, with psychosis (HCC) Active Problems:   Alcohol dependence (HCC)  Total Time spent with patient: 30 minutes  Past Psychiatric History: see H&P  Past Medical History:  Past Medical History:  Diagnosis Date   Depression    Diabetes mellitus without complication (HCC)    Hypertension    History reviewed. No pertinent surgical history. Family History:  Family History  Problem Relation Age of Onset   Cancer Father    Family Psychiatric  History: see H&P Social History:  Social History   Substance and Sexual Activity  Alcohol Use Yes   Comment: heavy     Social History   Substance and Sexual Activity  Drug Use No    Social History   Socioeconomic History   Marital status: Single    Spouse name: Not on file   Number of children: Not on file   Years of education: Not on file   Highest education level: Not on file  Occupational History   Not on  file  Tobacco Use   Smoking status: Every Day    Current packs/day: 0.25    Types: Cigarettes   Smokeless tobacco: Never  Vaping Use   Vaping status: Never Used  Substance and Sexual Activity   Alcohol use: Yes    Comment: heavy   Drug use: No   Sexual activity: Not on file  Other Topics Concern   Not on file  Social History Narrative   Not on file   Social Drivers of Health   Financial Resource Strain: Not on file  Food Insecurity: No Food Insecurity (04/25/2024)   Hunger Vital Sign    Worried About Running Out of Food in the Last Year: Never true    Ran Out of Food in the Last Year: Never true  Transportation Needs: No Transportation Needs (04/25/2024)   PRAPARE - Administrator, Civil Service (Medical): No    Lack of Transportation (Non-Medical): No  Physical Activity: Not on file  Stress: Not on file  Social Connections: Unknown (08/13/2021)   [facility]   Social Connections    Frequency of Communication with Friends and Family: Not asked    Frequency of Social Gatherings with Friends and Family: Not asked   Additional Social History:                         Sleep: Poor  Appetite:  Fair  Current Medications: Current Facility-Administered Medications  Medication Dose Route Frequency Provider Last Rate Last Admin   acetaminophen  (TYLENOL ) tablet 650 mg  650 mg Oral Q6H PRN Volanda Gruber, NP       alum & mag hydroxide-simeth (MAALOX/MYLANTA) 200-200-20 MG/5ML suspension 30 mL  30 mL Oral Q4H PRN Volanda Gruber, NP   30 mL at 04/28/24 2038   ARIPiprazole  (ABILIFY ) tablet 15 mg  15 mg Oral Daily Beretta Ginsberg, MD   15 mg at 04/30/24 0749   haloperidol  (HALDOL ) tablet 5 mg  5 mg Oral TID PRN Volanda Gruber, NP       And   diphenhydrAMINE  (BENADRYL ) capsule 50 mg  50 mg Oral TID PRN Volanda Gruber, NP       haloperidol  lactate (HALDOL ) injection 5 mg  5 mg Intramuscular TID PRN Volanda Gruber, NP       And    diphenhydrAMINE  (BENADRYL ) injection 50 mg  50 mg Intramuscular TID PRN Volanda Gruber, NP       And   LORazepam  (ATIVAN ) injection 2 mg  2 mg Intramuscular TID PRN Volanda Gruber, NP       haloperidol  lactate (HALDOL ) injection 10 mg  10 mg Intramuscular TID PRN Volanda Gruber, NP       And   diphenhydrAMINE  (BENADRYL ) injection 50 mg  50 mg Intramuscular TID PRN Volanda Gruber, NP       And   LORazepam  (ATIVAN ) injection 2 mg  2 mg Intramuscular TID PRN Volanda Gruber, NP       magnesium  hydroxide (MILK OF MAGNESIA) suspension 30 mL  30 mL Oral Daily PRN Volanda Gruber, NP       metFORMIN  (GLUCOPHAGE -XR) 24 hr tablet 500 mg  500 mg Oral Q breakfast Bryttany Tortorelli, MD   500 mg at 04/30/24 0749   multivitamin with minerals tablet 1 tablet  1 tablet Oral Daily Volanda Gruber, NP   1 tablet at 04/30/24 0749   naltrexone  (DEPADE) tablet 50 mg  50 mg Oral Daily Uyen Eichholz, MD   50 mg at 04/30/24 0749   nicotine  (NICODERM CQ  - dosed in mg/24 hours) patch 21 mg  21 mg Transdermal Q0600 Volanda Gruber, NP       sertraline  (ZOLOFT ) tablet 100 mg  100 mg Oral QHS Sierrah Luevano, MD       simvastatin  (ZOCOR ) tablet 10 mg  10 mg Oral QHS Lenus Trauger, MD   10 mg at 04/29/24 2114   thiamine  (Vitamin B-1) tablet 100 mg  100 mg Oral Daily Volanda Gruber, NP   100 mg at 04/30/24 7829   thiamine  (VITAMIN B1) injection 100 mg  100 mg Intramuscular Once Volanda Gruber, NP       traZODone  (DESYREL ) tablet 200 mg  200 mg Oral QHS PRN Lilymae Swiech, MD   200 mg at 04/29/24 2114    Lab Results:  No results found for this or any previous visit (from the past 48 hours).   Blood Alcohol level:  Lab Results  Component Value Date   ETH 179 (H) 04/24/2024   ETH 130 (H) 04/20/2024    Metabolic Disorder Labs: Lab Results  Component Value Date   HGBA1C 5.6 04/27/2024   MPG 114 04/27/2024   MPG 122.63 10/10/2023   No results found for: "PROLACTIN" Lab Results  Component Value Date    CHOL 204 (H) 04/27/2024   TRIG 242 (H) 04/27/2024   HDL 34 (L) 04/27/2024  CHOLHDL 6.0 04/27/2024   VLDL 48 (H) 04/27/2024   LDLCALC 122 (H) 04/27/2024   LDLCALC 81 10/07/2023     Musculoskeletal: Strength & Muscle Tone: within normal limits Gait & Station: normal Patient leans: N/A  Psychiatric Specialty Exam:  Presentation  General Appearance:  Disheveled  Eye Contact: good  Speech: Normal rate  Speech Volume: Decreased  Handedness: Right   Mood and Affect  Mood: Anxious; Depressed   Affect: Congruent   Thought Process  Thought Processes: Coherent  Descriptions of Associations:Intact  Orientation:Full (Time, Place and Person)  Thought Content: denies paranoia  History of Schizophrenia/Schizoaffective disorder:No  Duration of Psychotic Symptoms:N/A  Hallucinations: denies AVH  Ideas of Reference: no longer voicing ideas of reference  Suicidal Thoughts: denies Homicidal Thoughts: denies   Sensorium  Memory: Immediate Fair  Judgment: Fair  Insight: Fair   Art therapist  Concentration: Fair  Attention Span: Fair  Recall: [employer] of Knowledge: Fair  Language: Fair   Psychomotor Activity  Psychomotor Activity: No data recorded   Assets  Assets: Communication Skills; Desire for Improvement; Physical Health   Sleep  Sleep: 7 hours    Physical Exam: General: Well developed, well nourished, african american gentleman, balding Pupils: Normal at 3mm Respiratory: Breathing is unlabored.  Cardiovascular: No edema.  Language: No anomia, no aphasia Muscle strength and tone-pt moving all extremities.  Gait not assessed as pt remained in bed.  Neuro: Facial muscles are symmetric. Pt without tremor, no evidence of hyperarousal.  Review of Systems  Constitutional: Negative.   HENT: Negative.    Eyes: Negative.   Respiratory: Negative.    Cardiovascular: Negative.   Gastrointestinal: Negative.    Genitourinary: Negative.   Musculoskeletal: Negative.   Skin: Negative.   Neurological: Negative.   Endo/Heme/Allergies: Negative.   Psychiatric/Behavioral:  Positive for depression and substance abuse. Negative for hallucinations and suicidal ideas (denies). The patient is nervous/anxious. The patient does not have insomnia.    Blood pressure 106/72, pulse 73, temperature 98 F (36.7 C), temperature source Oral, resp. rate 17, height 5\' 2"  (1.575 m), weight 75 kg, SpO2 97%. Body mass index is 30.24 kg/m.   Treatment Plan Summary: MDD (major depressive disorder), recurrent, severe, with psychosis (HCC) Active Problems:   Alcohol dependence (HCC)    6/6: Patient presenting with worsening depression and suicidal ideation. Had a gun and was thinking about killing himself. States guns is at his friend's Aron Lard. Long history of alcohol abuse. Patient reports he started drinking since April due to multiple psychosocial stressors. Has been more agitated and reporting AH of derogatory voices since April as well and states he has been irritable around other. Patient at high acute risk for suicide.   6/7: presents severely depressed, dysphoric and isolative to room with paranoia and irritability. Tolerating medications and denies AH today but isolative to room. Discussed increasing Abilify  dosing. Inpatient rehab referral pending. Passive thoughts of death and hopelessness. Denies SI or HI today although patient was reporting both on admission. Gun with friend Aron Lard. Patient at high acute risk for suicide.  6/8: improvement in paranoia, anger and irritability. Denies AH today. Denies SI or HI however having passive thoughts of death, negative ruminations, and significant depression. Patient reports insomnia and will increase trazodone  to 200 mg at bedtime-PRN  6/9: Patient presenting with depression but is no longer suicidal. Interested in inpatient rehab. Will initiate 50 mg naltrexone  for alcohol  cessation. Plan for discharge on Wednesday either to friends house or residential rehab.  6/10: improving mood and patient agreeable to increase of Zoloft .  Denies SI, HI or AVH. No evidence of mania or psychosis. Patient future oriented on discharge tomorrow. Rehab referrals sent however patient has court date next week.    Observation Level/Precautions:  15 minute checks  Laboratory:  CBC Chemistry Profile Folic Acid  HbAIC UDS UA Vitamin B-12  Psychotherapy:    Medications:  Zoloft , hydroxyzine , trazodone , Abilify   Consultations:    Discharge Concerns:  rehab referrals  Estimated LOS:4-6 days  Other:      Safety and Monitoring: voluntarily admission to inpatient psychiatric unit for safety, stabilization and treatment Daily contact with patient to assess and evaluate symptoms and progress in treatment Patient's case to be discussed in multi-disciplinary team meeting Observation Level : q15 minute checks Vital signs: q12 hours Precautions: suicide, elopement, and assault   2. Psychiatric Problems #MDD, severe recurrent with psychotic features increase sertraline  to 100 mg qdaily -cont Abilify  to 15 mg qdaily for mood stabilization, agitation and psychosis - cont trazodone  to 200 mg at bedtime-PRN for insomnia   #alcohol use disorder severe without withdrawal - patient denies history of complicated withdrawals, CIWA negative - maintain on CIWA protocol + PRN Ativan  -started Naltrexone  50 mg qdaily    3. Medical Management Covid negative CMP: wnl CBC: unremarkable EtOH: 179 UDS: wnl TSH: wnl A1C: ordered AM draw Lipids: elevated LDL and triglycerides, patient has been noncompliant with statin which was restarted     #Dm2 -cont  metformin  500 mg qddaily   #HLD,  cont simvastatin  10 mg qdaily     Physician Treatment Plan for Primary Diagnosis: MDD (major depressive disorder), recurrent, severe, with psychosis (HCC) Long Term Goal(s): Improvement in symptoms so as  ready for discharge   Short Term Goals: Ability to identify changes in lifestyle to reduce recurrence of condition will improve, Ability to verbalize feelings will improve, Ability to disclose and discuss suicidal ideas, Ability to demonstrate self-control will improve, Ability to identify and develop effective coping behaviors will improve, Ability to maintain clinical measurements within normal limits will improve, Compliance with prescribed medications will improve, and Ability to identify triggers associated with substance abuse/mental health issues will improve   DISPO: discharge tomorrow to shelter with rehab referrals  Miyani Cronic, MD 04/30/2024, 11:28 AM

## 2024-04-30 NOTE — Progress Notes (Signed)
  Aaron [name]   Type of Note: Discharge planning  Spoke with patient this morning who reports calling [facility] for phone screen. They stated pt has a court date on 05/10/24 and would need a letter from his lawyer stating he does not have to be present for hearing in order to go to treatment. Pt states he does not have a lawyer at this time.  Pt would like to be discharged to the [facility] in [place] tomorrow at time of discharge.  MD aware.  Signed:  Dean Wonder, LCSW-A 04/30/2024  9:02 AM

## 2024-04-30 NOTE — Plan of Care (Signed)
  Problem: Education: Goal: Knowledge of [facility] Education information/materials will improve Outcome: Completed/Met Goal: Mental status will improve Outcome: Progressing   Problem: Coping: Goal: Ability to verbalize frustrations and anger appropriately will improve Outcome: Adequate for Discharge Goal: Ability to demonstrate self-control will improve Outcome: Progressing

## 2024-05-01 DIAGNOSIS — F333 Major depressive disorder, recurrent, severe with psychotic symptoms: Principal | ICD-10-CM

## 2024-05-01 DIAGNOSIS — F1020 Alcohol dependence, uncomplicated: Secondary | ICD-10-CM

## 2024-05-01 MED ORDER — SERTRALINE HCL 100 MG PO TABS: 100.0000 mg | ORAL_TABLET | Freq: Every day | ORAL | 0 refills | Status: DC

## 2024-05-01 MED ORDER — SIMVASTATIN 10 MG PO TABS: 10.0000 mg | ORAL_TABLET | Freq: Every day | ORAL | 0 refills | Status: DC

## 2024-05-01 MED ORDER — NICOTINE 21 MG/24HR TD PT24: 21.0000 mg | MEDICATED_PATCH | Freq: Every day | TRANSDERMAL | 0 refills | Status: DC

## 2024-05-01 MED ORDER — NALTREXONE HCL 50 MG PO TABS: 50.0000 mg | ORAL_TABLET | Freq: Every day | ORAL | 0 refills | Status: DC

## 2024-05-01 MED ORDER — ARIPIPRAZOLE 15 MG PO TABS: 15.0000 mg | ORAL_TABLET | Freq: Every day | ORAL | 0 refills | Status: DC

## 2024-05-01 MED ORDER — METFORMIN HCL ER 500 MG PO TB24: 500.0000 mg | ORAL_TABLET | Freq: Every day | ORAL | 0 refills | Status: DC

## 2024-05-01 MED ORDER — TRAZODONE HCL 100 MG PO TABS: 200.0000 mg | ORAL_TABLET | Freq: Every evening | ORAL | 0 refills | Status: DC | PRN

## 2024-05-01 NOTE — Plan of Care (Signed)
  Problem: Education: Goal: Emotional status will improve Outcome: Progressing Goal: Mental status will improve Outcome: Progressing Goal: Verbalization of understanding the information provided will improve Outcome: Progressing   Problem: Activity: Goal: Interest or engagement in activities will improve Outcome: Progressing   Problem: Safety: Goal: Periods of time without injury will increase Outcome: Progressing

## 2024-05-01 NOTE — Progress Notes (Signed)
   04/30/24 2255  Psych Admission Type (Psych Patients Only)  Admission Status Voluntary  Psychosocial Assessment  Patient Complaints None  Eye Contact Fair  Facial Expression Flat  Affect Appropriate to circumstance  Speech Logical/coherent  Interaction Assertive  Motor Activity Slow  Appearance/Hygiene Unremarkable  Behavior Characteristics Cooperative  Mood Depressed;Pleasant  Thought Process  Coherency WDL  Content WDL  Delusions None reported or observed  Perception WDL  Hallucination None reported or observed  Judgment Limited  Confusion None  Danger to Self  Current suicidal ideation? Denies  Agreement Not to Harm Self Yes  Description of Agreement Verbal  Danger to Others  Danger to Others None reported or observed

## 2024-05-01 NOTE — Progress Notes (Signed)
  [facility] Adult Case Management Discharge Plan :  Will you be returning to the same living situation after discharge:  No. At discharge, do you have transportation home?: Yes,  CSW arranged [employer] for 1100AM Do you have the ability to pay for your medications: Yes,  pt has active health insurance coverage  Release of information consent forms completed and in the chart;  Patient's signature needed at discharge.  Patient to Follow up at:  Follow-up Information     [facility] Go on 05/17/2024.   Why: You have an appointment for medication management services on 05/17/24 at 11:00 am. The appointment will be held in person, but you may call to switch to Virtual. Contact information: [address] [place] [address] [phone]         [facility] Schedule an appointment as soon as possible for a visit.   Why: Please call this provider to personally schedule an appointment for therapy services. Contact information: [address]  [address] [place] [address] [phone]                 Next level of care provider has access to [facility] Link:no  Safety Planning and Suicide Prevention discussed: Yes,  completed with patient on 04/28/24     Has patient been referred to the Quitline?: Patient refused referral for treatment  Patient has been referred for addiction treatment: Pt unable to get into treatment prior to discharge. Pt given resources for substance use treatment and encouraged to continue calling after discharge.  Vonzell Guerin, LCSWA 05/01/2024, 9:07 AM

## 2024-05-01 NOTE — [facility] Group Notes (Signed)

## 2024-05-01 NOTE — Progress Notes (Signed)
 Patient ID: Aaron [name], male   DOB: 03-13-1976, 48 y.o.   MRN: [id number]  Patient discharged via taxi. Denies SI/HI/AVH. No distress noted.

## 2024-05-01 NOTE — Transportation (Signed)
 05/01/2024  Aaron [name] DOB: 12-23-1975 MRN: [id number]   RIDER WAIVER AND RELEASE OF LIABILITY  For the purposes of helping with transportation needs, [facility] partners with outside transportation providers (taxi companies, [place], Catering manager.) to give [facility] patients or other approved people the choice of on-demand rides Public librarian) to our buildings for non-emergency visits.  By using [employer], I, the person signing this document, on behalf of myself and/or any legal minors (in my care using the [employer]), agree:  Science writer given to me are supplied by independent, outside transportation providers who do not work for, or have any affiliation with, [employer]. [place] is not a transportation company. [facility] has no control over the quality or safety of the rides I get using [employer]. [facility] has no control over whether any outside ride will happen on time or not. [facility] gives no guarantee on the reliability, quality, safety, or availability on any rides, or that no mistakes will happen. I know and accept that traveling by vehicle (car, truck, [employer], [employer], bus, taxi, etc.) has risks of serious injuries such as disability, being paralyzed, and death. I know and agree the risk of using [employer] is mine alone, and not [employer]. [employer] are provided as is and as are available. The transportation providers are in charge for all inspections and care of the vehicles used to provide these rides. I agree not to take legal action against [facility], its agents, employees, officers, directors, representatives, insurers, attorneys, assigns, successors, subsidiaries, and affiliates at any time for any reasons related directly or indirectly to using [employer]. I also agree not to take legal action against [facility] or its affiliates for any injury, death, or damage to property caused by or related to  using [employer]. I have read this Waiver and Release of Liability, and I understand the terms used in it and their legal meaning. This Waiver is freely and voluntarily given with the understanding that my right (or any legal minors) to legal action against [facility] relating to [employer] is knowingly given up to use these services.   I attest that I read the Ride Waiver and Release of Liability to Aaron [name], gave Mr. Heidrick the opportunity to ask questions and answered the questions asked (if any). I affirm that Aaron [name] then provided consent for assistance with transportation.

## 2024-05-01 NOTE — Plan of Care (Signed)
  Problem: Education: Goal: Emotional status will improve Outcome: Adequate for Discharge Goal: Mental status will improve Outcome: Adequate for Discharge Goal: Verbalization of understanding the information provided will improve Outcome: Adequate for Discharge   Problem: Activity: Goal: Interest or engagement in activities will improve Outcome: Adequate for Discharge Goal: Sleeping patterns will improve Outcome: Adequate for Discharge   Problem: Coping: Goal: Ability to verbalize frustrations and anger appropriately will improve Outcome: Adequate for Discharge Goal: Ability to demonstrate self-control will improve Outcome: Adequate for Discharge   Problem: Health Behavior/Discharge Planning: Goal: Identification of resources available to assist in meeting health care needs will improve Outcome: Adequate for Discharge Goal: Compliance with treatment plan for underlying cause of condition will improve Outcome: Adequate for Discharge   Problem: Physical Regulation: Goal: Ability to maintain clinical measurements within normal limits will improve Outcome: Adequate for Discharge   Problem: Safety: Goal: Periods of time without injury will increase Outcome: Adequate for Discharge   Problem: Education: Goal: Ability to state activities that reduce stress will improve Outcome: Adequate for Discharge   Problem: Education: Goal: Utilization of techniques to improve thought processes will improve Outcome: Adequate for Discharge Goal: Knowledge of the prescribed therapeutic regimen will improve Outcome: Adequate for Discharge   Problem: Education: Goal: Knowledge of disease or condition will improve Outcome: Adequate for Discharge Goal: Understanding of discharge needs will improve Outcome: Adequate for Discharge   Problem: Education: Goal: Ability to make informed decisions regarding treatment will improve Outcome: Adequate for Discharge   Problem: Education: Goal:  Ability to state activities that reduce stress will improve Outcome: Adequate for Discharge
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
## 2024-05-01 NOTE — [facility] Suicide Risk Assessment (Signed)
 [facility] Discharge Suicide Risk Assessment   Principal Problem: MDD (major depressive disorder), recurrent, severe, with psychosis (HCC) Discharge Diagnoses: Principal Problem:   MDD (major depressive disorder), recurrent, severe, with psychosis (HCC) Active Problems:   Alcohol dependence (HCC)   Total Time spent with patient: 30 minutes  Musculoskeletal: Strength & Muscle Tone: within normal limits Gait & Station: normal Patient leans: N/A  Psychiatric Specialty Exam  Presentation  General Appearance:  Appropriate for Environment  Eye Contact: Fair  Speech: Clear and Coherent  Speech Volume: Normal  Handedness: Right   Mood and Affect  Mood: Euthymic  Duration of Depression Symptoms: N/A  Affect: Congruent   Thought Process  Thought Processes: Coherent  Descriptions of Associations:Intact  Orientation:Full (Time, Place and Person)  Thought Content:Logical  History of Schizophrenia/Schizoaffective disorder:No  Duration of Psychotic Symptoms:Less than six months  Hallucinations:Hallucinations: None  Ideas of Reference:None  Suicidal Thoughts:Suicidal Thoughts: No  Homicidal Thoughts:Homicidal Thoughts: No   Sensorium  Memory: Immediate Fair  Judgment: Fair  Insight: Fair   Art therapist  Concentration: Fair  Attention Span: Fair  Recall: [employer] of Knowledge: Fair  Language: Fair   Psychomotor Activity  Psychomotor Activity: Psychomotor Activity: Normal   Assets  Assets: Communication Skills; Desire for Improvement; Housing; Physical Health; Social Support   Sleep  Sleep: Sleep: Fair   Physical Exam: Physical Exam ROS Blood pressure 123/81, pulse (!) 138, temperature 98 F (36.7 C), temperature source Oral, resp. rate 18, height 5' 2 (1.575 m), weight 75 kg, SpO2 94%. Body mass index is 30.24 kg/m.  Mental Status Per Nursing Assessment::   On Admission:  Suicidal ideation indicated by  patient  Demographic Factors:  Male  Loss Factors: Legal issues and Financial problems/change in socioeconomic status  Historical Factors: NA  Risk Reduction Factors:   Sense of responsibility to family and Positive therapeutic relationship  Continued Clinical Symptoms:  Previous Psychiatric Diagnoses and Treatments  Cognitive Features That Contribute To Risk:  None    Suicide Risk:  Minimal: No identifiable suicidal ideation.  Patients presenting with no risk factors but with morbid ruminations; may be classified as minimal risk based on the severity of the depressive symptoms  Patient denies SI or HI for >48 hours. Denies wanting to be dead and future oriented. SRA complete and acute risk for suicide is low.   [place] Suicide Risk assessment:  1. Do you wish to be dead? NONE REPORTED 2. Have you wished your dead or wished you could go to sleep and not wake up? NONE REPORTED 3.  Have you actually had thoughts of killing yourself?  NONE REPORTED 4.  Have you been thinking about how you might do this?  NONE REPORTED 5.  Have you had these thoughts and some intention of acting on them? NONE REPORTED 6.  Have you started to work out or worked out the details to kill yourself? NONE REPORTED 7.  Do you intend to carry out this plan? NONE REPORTED 8. On a scale of 1-5 with 1 being the least severe and 5 being the most severe answer the following questions place for intensity of ideation. ZERO 9. How many times have you had these thoughts? NONE REPORTED 10. When you have the thoughts how long to the last?  NONE REPORTED 11. Control ability.  Could you or can you stop thinking about killing herself or wanting to die if you want to?  YES 12. Are there any things anyone or anything family religion pain of  death that stop you from wanting to die or acting on thoughts of committing suicide?  FAMILY 13.  What sort of reason to do have to think about wanting to die or killing yourself? NONE  REPORTED 14.Was it to end the pain or stop the way you are feeling in other words you could not go on living with his pain or how you are feeling or was not to get attention revenge or reaction from others?  Or both?  NONE REPORTED  [place] Suicide Risk assessment:  1. Do you wish to be dead? NONE REPORTED 2. Have you wished your dead or wished you could go to sleep and not wake up? NONE REPORTED 3.  Have you actually had thoughts of killing yourself?  NONE REPORTED 4.  Have you been thinking about how you might do this?  NONE REPORTED 5.  Have you had these thoughts and some intention of acting on them? NONE REPORTED 6.  Have you started to work out or worked out the details to kill yourself? NONE REPORTED 7.  Do you intend to carry out this plan? NONE REPORTED 8. On a scale of 1-5 with 1 being the least severe and 5 being the most severe answer the following questions place for intensity of ideation. ZERO 9. How many times have you had these thoughts? NONE REPORTED 10. When you have the thoughts how long to the last?  NONE REPORTED 11. Control ability.  Could you or can you stop thinking about killing herself or wanting to die if you want to?  YES 12. Are there any things anyone or anything family religion pain of death that stop you from wanting to die or acting on thoughts of committing suicide?  I want to get reconnected with God 13.  What sort of reason to do have to think about wanting to die or killing yourself? NONE REPORTED 14.Was it to end the pain or stop the way you are feeling in other words you could not go on living with his pain or how you are feeling or was not to get attention revenge or reaction from others?  Or both?  NONE REPORTED    Follow-up Information     [facility] Go on 05/17/2024.   Why: You have an appointment for medication management services on 05/17/24 at 11:00 am. The appointment will be held in person, but you may call to switch to Virtual. Contact  information: [address] [place] [address] [phone]         [facility] Schedule an appointment as soon as possible for a visit.   Why: Please call this provider to personally schedule an appointment for therapy services. Contact information: [address]  [address] [place] [address] [phone]                 Plan Of Care/Follow-up recommendations:  Outpatient followup as above  Lavoris Sparling, MD 05/01/2024, 9:03 AM

## 2024-05-01 NOTE — Group Note (Signed)
 Date:  05/01/2024 Time:  5:30 AM  Group Topic/Focus:  Wrap-Up Group:   The focus of this group is to help patients review their daily goal of treatment and discuss progress on daily workbooks.    Participation Level:  Active  Participation Quality:  Appropriate and Sharing  Affect:  Appropriate  Cognitive:  Appropriate  Insight: Appropriate  Engagement in Group:  Engaged  Modes of Intervention:  Socialization  Additional Comments:  Patient used wrap up group sheet as guide when sharing. Patient rated his day a 7 out of 10. Patient goal for today, get a discharge date and patient did achieve goal, yes. Coping skills the patient finds most helpful, staying positive. Something the patient likes about himself, humble, easy to get along with.  Patient did not have any questions or concerns.   Dillard Frame 05/01/2024, 5:30 AM

## 2024-07-08 ENCOUNTER — Other Ambulatory Visit: Payer: Self-pay

## 2024-07-08 ENCOUNTER — Emergency Department (HOSPITAL_COMMUNITY)
Admission: EM | Admit: 2024-07-08 | Discharge: 2024-07-09 | Disposition: A | Discharge status: Home / Self Care | Payer: MEDICAID | Attending: Emergency Medicine | Admitting: Emergency Medicine

## 2024-07-08 DIAGNOSIS — L02429 Furuncle of limb, unspecified: Secondary | ICD-10-CM | POA: Insufficient documentation

## 2024-07-08 DIAGNOSIS — Z7984 Long term (current) use of oral hypoglycemic drugs: Secondary | ICD-10-CM | POA: Insufficient documentation

## 2024-07-08 DIAGNOSIS — E119 Type 2 diabetes mellitus without complications: Secondary | ICD-10-CM | POA: Diagnosis not present

## 2024-07-08 DIAGNOSIS — I10 Essential (primary) hypertension: Secondary | ICD-10-CM | POA: Insufficient documentation

## 2024-07-08 MED ORDER — DOXYCYCLINE HYCLATE 100 MG PO TABS
100.0000 mg | ORAL_TABLET | Freq: Once | ORAL | Status: AC
  Administered 2024-07-08: 100 mg via ORAL
  Filled 2024-07-08: qty 1

## 2024-07-08 MED ORDER — ONDANSETRON 4 MG PO TBDP
4.0000 mg | ORAL_TABLET | Freq: Once | ORAL | Status: AC
  Administered 2024-07-08: 4 mg via ORAL
  Filled 2024-07-08: qty 1

## 2024-07-08 NOTE — ED Triage Notes (Signed)
 Patient reports skin abscess at right inner thigh this week with drainage , no fever or chills.

## 2024-07-08 NOTE — ED Provider Notes (Signed)
 [facility]ERGENCY DEPARTMENT AT [facility] Provider Note   CSN: [id number] Arrival date & time: 07/08/24  2149     Patient presents with: Boil R Thigh   Aaron [name] is a 48 y.o. male.  {Add pertinent medical, surgical, social history, OB history to YEP:67052} The history is provided by the patient and medical records.   48 y.o. M with very of alcohol abuse, diabetes, hypertension, depression, presenting to the ED with wound of right medial thigh.  States this was present over the past several days, ruptured spontaneously and has been draining some purulent material.  He was recently in jail and was not given any antibiotics or other wound care.  He denies any fever or chills.  No redness or increased swelling.  No prior history of MRSA or other skin infections.  Prior to Admission medications   Medication Sig Start Date End Date Taking? Authorizing Provider  ARIPiprazole  (ABILIFY ) 15 MG tablet Take 1 tablet (15 mg total) by mouth daily. 05/02/24   Zouev, Dmitri, MD  metFORMIN  (GLUCOPHAGE -XR) 500 MG 24 hr tablet Take 1 tablet (500 mg total) by mouth daily with breakfast. 05/02/24   Zouev, Dmitri, MD  naltrexone  (DEPADE) 50 MG tablet Take 1 tablet (50 mg total) by mouth daily. 05/02/24   Zouev, Dmitri, MD  nicotine  (NICODERM CQ  - DOSED IN MG/24 HOURS) 21 mg/24hr patch Place 1 patch (21 mg total) onto the skin daily at 6 (six) AM. 05/02/24   Zouev, Dmitri, MD  sertraline  (ZOLOFT ) 100 MG tablet Take 1 tablet (100 mg total) by mouth at bedtime. 05/01/24   Zouev, Dmitri, MD  simvastatin  (ZOCOR ) 10 MG tablet Take 1 tablet (10 mg total) by mouth at bedtime. 05/01/24   Zouev, Dmitri, MD  traZODone  (DESYREL ) 100 MG tablet Take 2 tablets (200 mg total) by mouth at bedtime as needed for sleep (insomnia). 05/01/24   Zouev, Dmitri, MD    Allergies: Patient has no known allergies.    Review of Systems  Skin:  Positive for wound.  All other systems reviewed and are  negative.   Updated Vital Signs BP 109/78 (BP Location: Right Arm)   Pulse 96   Temp 98.2 F (36.8 C)   Resp 16   SpO2 98%   Physical Exam Vitals and nursing note reviewed.  Constitutional:      Appearance: He is well-developed.  HENT:     Head: Normocephalic and atraumatic.  Eyes:     Conjunctiva/sclera: Conjunctivae normal.     Pupils: Pupils are equal, round, and reactive to light.  Cardiovascular:     Rate and Rhythm: Normal rate and regular rhythm.     Heart sounds: Normal heart sounds.  Pulmonary:     Effort: Pulmonary effort is normal.     Breath sounds: Normal breath sounds.  Abdominal:     General: Bowel sounds are normal.     Palpations: Abdomen is soft.  Musculoskeletal:        General: Normal range of motion.     Cervical back: Normal range of motion.     Comments: Small boil present to right medial thigh, there is some drainage present on bandage but no further expressed with palpation, no surrounding erythema/induration present  Skin:    General: Skin is warm and dry.  Neurological:     Mental Status: He is alert and oriented to person, place, and time.     (all labs ordered are listed, but only abnormal results are  displayed) Labs Reviewed - No data to display  EKG: None  Radiology: No results found.  {Document cardiac monitor, telemetry assessment procedure when appropriate:32947} Procedures   Medications Ordered in the ED  doxycycline  (VIBRA -TABS) tablet 100 mg (has no administration in time range)  ondansetron  (ZOFRAN -ODT) disintegrating tablet 4 mg (has no administration in time range)      {Click here for ABCD2, HEART and other calculators REFRESH Note before signing:1}                              Medical Decision Making Risk Prescription drug management.     Final diagnoses:  None    ED Discharge Orders     None

## 2024-07-09 MED ORDER — DOXYCYCLINE HYCLATE 100 MG PO CAPS: 100.0000 mg | ORAL_CAPSULE | Freq: Two times a day (BID) | ORAL | 0 refills | Status: DC

## 2024-07-09 NOTE — Discharge Instructions (Signed)
 Take the prescribed medication as directed. Keep clean with soap and water.  Can do warm compresses a few times a day. Follow-up with your doctor. Return to the ED for new or worsening symptoms.

## 2024-07-10 ENCOUNTER — Other Ambulatory Visit: Payer: Self-pay

## 2024-07-10 ENCOUNTER — Emergency Department (HOSPITAL_COMMUNITY)
Admission: EM | Admit: 2024-07-10 | Discharge: 2024-07-11 | Disposition: A | Discharge status: Home / Self Care | Attending: Emergency Medicine | Admitting: Emergency Medicine

## 2024-07-10 DIAGNOSIS — R519 Headache, unspecified: Secondary | ICD-10-CM | POA: Diagnosis not present

## 2024-07-10 DIAGNOSIS — R45851 Suicidal ideations: Secondary | ICD-10-CM | POA: Insufficient documentation

## 2024-07-10 DIAGNOSIS — F1020 Alcohol dependence, uncomplicated: Secondary | ICD-10-CM | POA: Insufficient documentation

## 2024-07-10 DIAGNOSIS — Z7984 Long term (current) use of oral hypoglycemic drugs: Secondary | ICD-10-CM | POA: Diagnosis not present

## 2024-07-10 DIAGNOSIS — R45850 Homicidal ideations: Secondary | ICD-10-CM | POA: Insufficient documentation

## 2024-07-10 DIAGNOSIS — E119 Type 2 diabetes mellitus without complications: Secondary | ICD-10-CM | POA: Insufficient documentation

## 2024-07-10 DIAGNOSIS — Z765 Malingerer [conscious simulation]: Secondary | ICD-10-CM | POA: Diagnosis not present

## 2024-07-10 DIAGNOSIS — Y905 Blood alcohol level of 100-119 mg/100 ml: Secondary | ICD-10-CM | POA: Insufficient documentation

## 2024-07-10 DIAGNOSIS — I10 Essential (primary) hypertension: Secondary | ICD-10-CM | POA: Insufficient documentation

## 2024-07-10 DIAGNOSIS — R7401 Elevation of levels of liver transaminase levels: Secondary | ICD-10-CM | POA: Insufficient documentation

## 2024-07-10 DIAGNOSIS — F1010 Alcohol abuse, uncomplicated: Secondary | ICD-10-CM | POA: Diagnosis present

## 2024-07-10 LAB — CBC
HCT: 43.2 % (ref 39.0–52.0)
Hemoglobin: 14.1 g/dL (ref 13.0–17.0)
MCH: 29.1 pg (ref 26.0–34.0)
MCHC: 32.6 g/dL (ref 30.0–36.0)
MCV: 89.1 fL (ref 80.0–100.0)
Platelets: 368 K/uL (ref 150–400)
RBC: 4.85 MIL/uL (ref 4.22–5.81)
RDW: 14 % (ref 11.5–15.5)
WBC: 6.4 K/uL (ref 4.0–10.5)
nRBC: 0 % (ref 0.0–0.2)

## 2024-07-10 LAB — COMPREHENSIVE METABOLIC PANEL WITH GFR
ALT: 49 U/L — ABNORMAL HIGH (ref 0–44)
AST: 28 U/L (ref 15–41)
Albumin: 3.7 g/dL (ref 3.5–5.0)
Alkaline Phosphatase: 65 U/L (ref 38–126)
Anion gap: 15 (ref 5–15)
BUN: 9 mg/dL (ref 6–20)
CO2: 20 mmol/L — ABNORMAL LOW (ref 22–32)
Calcium: 8.9 mg/dL (ref 8.9–10.3)
Chloride: 105 mmol/L (ref 98–111)
Creatinine, Ser: 0.63 mg/dL (ref 0.61–1.24)
GFR, Estimated: >60 mL/min (ref >60)
Glucose, Bld: 98 mg/dL (ref 70–99)
Potassium: 3.6 mmol/L (ref 3.5–5.1)
Sodium: 140 mmol/L (ref 135–145)
Total Bilirubin: 0.6 mg/dL (ref 0.0–1.2)
Total Protein: 7.3 g/dL (ref 6.5–8.1)

## 2024-07-10 LAB — ETHANOL: Alcohol, Ethyl (B): 106 mg/dL — ABNORMAL HIGH (ref <15)

## 2024-07-10 NOTE — ED Triage Notes (Signed)
 Reports he is suicidal and homicidal. ETOH

## 2024-07-11 ENCOUNTER — Ambulatory Visit (HOSPITAL_COMMUNITY)
Admission: EM | Admit: 2024-07-11 | Discharge: 2024-07-12 | Disposition: A | Discharge status: Other Acute Inpatient Hospital

## 2024-07-11 DIAGNOSIS — F1020 Alcohol dependence, uncomplicated: Secondary | ICD-10-CM | POA: Diagnosis not present

## 2024-07-11 DIAGNOSIS — Z765 Malingerer [conscious simulation]: Secondary | ICD-10-CM

## 2024-07-11 DIAGNOSIS — R45851 Suicidal ideations: Secondary | ICD-10-CM

## 2024-07-11 LAB — RAPID URINE DRUG SCREEN, HOSP PERFORMED
Amphetamines: NOT DETECTED
Barbiturates: NOT DETECTED
Benzodiazepines: NOT DETECTED
Cocaine: NOT DETECTED
Opiates: NOT DETECTED
Tetrahydrocannabinol: NOT DETECTED

## 2024-07-11 MED ORDER — FOLIC ACID 1 MG PO TABS
1.0000 mg | ORAL_TABLET | Freq: Every day | ORAL | Status: DC
  Administered 2024-07-11: 1 mg via ORAL
  Filled 2024-07-11: qty 1

## 2024-07-11 MED ORDER — LORAZEPAM 2 MG/ML IJ SOLN: 1.0000 mg | INTRAMUSCULAR | Status: DC | PRN

## 2024-07-11 MED ORDER — IBUPROFEN 800 MG PO TABS
800.0000 mg | ORAL_TABLET | Freq: Once | ORAL | Status: AC
  Administered 2024-07-11: 800 mg via ORAL
  Filled 2024-07-11: qty 1

## 2024-07-11 MED ORDER — ADULT MULTIVITAMIN W/MINERALS CH
1.0000 | ORAL_TABLET | Freq: Every day | ORAL | Status: DC
  Administered 2024-07-11: 1 via ORAL
  Filled 2024-07-11: qty 1

## 2024-07-11 MED ORDER — THIAMINE MONONITRATE 100 MG PO TABS
100.0000 mg | ORAL_TABLET | Freq: Every day | ORAL | Status: DC
  Administered 2024-07-11: 100 mg via ORAL
  Filled 2024-07-11: qty 1

## 2024-07-11 MED ORDER — LORAZEPAM 1 MG PO TABS: 1.0000 mg | ORAL_TABLET | ORAL | Status: DC | PRN

## 2024-07-11 MED ORDER — THIAMINE HCL 100 MG/ML IJ SOLN: 100.0000 mg | Freq: Every day | INTRAMUSCULAR | Status: DC
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
## 2024-07-11 NOTE — Progress Notes (Signed)
   07/11/24 2257  [facility] Triage Screening (Walk-ins at [facility] only)  How Did You Hear About Us ? Self  What Is the Reason for Your Visit/Call Today? Pt presents to [facility] as a voluntary walk-in, unaccompanied with complaint worsening depression, SI, with a plan to shoot himself and HI without plan/intent. Pt reports losing his job and lack of natural supports as an immediate stressor at this time. Pt denies access to weapons, but indicated he knows how to get one. Pt reports hx of MDD and anxiety. Pt denies taking prescribed medication at this time. Pt denies beig linked to outpatient therapist. Pt currently denies AVH.  How Long Has This Been Causing You Problems? 1-6 months  Have You Recently Had Any Thoughts About Hurting Yourself? Yes  How long ago did you have thoughts about hurting yourself? currently  Are You Planning to Commit Suicide/Harm Yourself At This time? Yes  Have you Recently Had Thoughts About Hurting Someone Sherral? Yes  How long ago did you have thoughts of harming others? currently  Are You Planning To Harm Someone At This Time? No  Physical Abuse Denies  Verbal Abuse Denies  Sexual Abuse Denies  Exploitation of patient/patient's resources Denies  Self-Neglect Denies  Are you currently experiencing any auditory, visual or other hallucinations? No  Have You Used Any Alcohol or Drugs in the Past 24 Hours? Yes  What Did You Use and How Much? unable to remember what he drank, but states he consumed alcohol a few hours prior to arrival  Do you have any current medical co-morbidities that require immediate attention? No  Clinician description of patient physical appearance/behavior: Pt is cooperative, spoke very low, lacked eye contact at times  What Do You Feel Would Help You the Most Today? Treatment for Depression or other mood problem  If access to [facility] was not available, would you have sought care in the Emergency Department? Yes  Determination of Need Urgent (48 hours)   Options For Referral Other: Comment;BH Urgent Care;Outpatient Therapy;Medication Management;Inpatient Hospitalization  Determination of Need filed? Yes

## 2024-07-11 NOTE — BH Assessment (Addendum)
 Comprehensive Clinical Assessment (CCA) Note   07/12/2024 Aaron [name] [id number]  Disposition: Richerd Ivans, NP recommends [facility].   The patient demonstrates the following risk factors for suicide: Chronic risk factors for suicide include: previous suicide attempts  . Acute risk factors for suicide include: unemployment and social withdrawal/isolation. Protective factors for this patient include: positive social support. Considering these factors, the overall suicide risk at this point appears to be high. Patient is not appropriate for outpatient follow up.   Per triage: Pt presents to [facility] as a voluntary walk-in, unaccompanied with complaint worsening depression, SI, with a plan to shoot himself and HI without plan/intent. Pt reports losing his job and lack of natural supports as an immediate stressor at this time. Pt denies access to weapons, but indicated he knows how to get one. Pt reports hx of MDD and anxiety. Pt denies taking prescribed medication at this time. Pt denies beig linked to outpatient therapist. Pt currently denies AVH.  Upon evaluation with this clinician, the patient is alert, oriented x 3, and cooperative. Speech is clear, coherent and logical. Pt appears casual. Eye contact is fair. Mood is anxious and depressed; affect is congruent with mood. The thought process is logical and thought content is coherent. Pt endorses SI with the plan to shoot self. Pt denies HI/AVH. There is no indication that the patient is responding to internal stimuli. No delusions elicited during this assessment.     Chief Complaint:  Chief Complaint  Patient presents with   Depression   Suicidal Ideation   Homicidal Ideation   Visit Diagnosis: Major Depressive Disorder     CCA Screening, Triage and Referral (STR)  Patient Reported Information How did you hear about us ? Self  What Is the Reason for Your Visit/Call Today? Pt presents to [facility] as a voluntary walk-in, unaccompanied  with complaint worsening depression, SI, with a plan to shoot himself and HI without plan/intent. Pt reports losing his job and lack of natural supports as an immediate stressor at this time. Pt denies access to weapons, but indicated he knows how to get one. Pt reports hx of MDD and anxiety. Pt denies taking prescribed medication at this time. Pt denies beig linked to outpatient therapist. Pt currently denies AVH.  How Long Has This Been Causing You Problems? 1-6 months  What Do You Feel Would Help You the Most Today? Treatment for Depression or other mood problem   Have You Recently Had Any Thoughts About Hurting Yourself? Yes  Are You Planning to Commit Suicide/Harm Yourself At This time? Yes   Flowsheet Row ED from 07/11/2024 in [facility] ED from 07/10/2024 in [facility] Department at [facility] ED from 07/08/2024 in [facility] Emergency Department at [facility]  C-SSRS RISK CATEGORY High Risk High Risk No Risk    Have you Recently Had Thoughts About Hurting Someone [place]? Yes  Are You Planning to Harm Someone at This Time? No  Explanation: Denies HI   Have You Used Any Alcohol or Drugs in the Past 24 Hours? Yes  How Long Ago Did You Use Drugs or Alcohol?Today What Did You Use and How Much? unable to remember what he drank, but states he consumed alcohol a few hours prior to arrival   Do You Currently Have a Therapist/Psychiatrist? No  Name of Therapist/Psychiatrist:    Have You Been Recently Discharged From Any Office Practice or Programs? No  Explanation of Discharge From Practice/Program: D/C from  [facility] today.     CCA Screening Triage Referral Assessment Type of Contact: Face-to-Face  Telemedicine Service Delivery:   Is this Initial or Reassessment?   Date Telepsych consult ordered in CHL:    Time Telepsych consult ordered in CHL:    Location of Assessment: [facility] Assessment Services  Provider  Location: [facility] Assessment Services   Collateral Involvement: None   Does Patient Have a Automotive engineer Guardian? -- (n/a)  Legal Guardian Contact Information: n/a  Copy of Legal Guardianship Form: -- (n/a)  Legal Guardian Notified of Arrival: -- (n/a)  Legal Guardian Notified of Pending Discharge: -- (n/a)  If Minor and Not Living with Parent(s), Who has Custody? n/a  Is CPS involved or ever been involved? Never  Is APS involved or ever been involved? Never   Patient Determined To Be At Risk for Harm To Self or Others Based on Review of Patient Reported Information or Presenting Complaint? Yes, for Self-Harm  Method: Plan without intent  Availability of Means: No access or NA  Intent: Clearly intends on inflicting harm that could cause death  Notification Required: No need or identified person  Additional Information for Danger to Others Potential: -- (n/a)  Additional Comments for Danger to Others Potential: n/a  Are There Guns or Other Weapons in Your Home? No  Types of Guns/Weapons: Denies access  Are These Weapons Safely Secured?                            No  Who Could Verify You Are Able To Have These Secured: Denies access  Do You Have any Outstanding Charges, Pending Court Dates, Parole/Probation? Denies pending legal charges  Contacted To Inform of Risk of Harm To Self or Others: -- (n/a)    Does Patient Present under Involuntary Commitment? No    [place] of Residence: [place]   Patient Currently Receiving the Following Services: Not Receiving Services   Determination of Need: Urgent (48 hours)   Options For Referral: Other: Comment; BH Urgent Care; Outpatient Therapy; Medication Management; Inpatient Hospitalization     CCA Biopsychosocial Patient Reported Schizophrenia/Schizoaffective Diagnosis in Past: No   Strengths: Willing to seek treatment   Mental Health Symptoms Depression:  Sleep (too much or little);  Irritability; Hopelessness; Fatigue; Increase/decrease in appetite; Tearfulness; Difficulty Concentrating; Worthlessness   Duration of Depressive symptoms: Duration of Depressive Symptoms: Greater than two weeks   Mania:  None   Anxiety:   Restlessness; Irritability; Fatigue; Difficulty concentrating; Worrying   Psychosis:  None   Duration of Psychotic symptoms:    Trauma:  None (UTA)   Obsessions:  None (UTA)   Compulsions:  None (UTA)   Inattention:  None (UTA)   Hyperactivity/Impulsivity:  None (UTA)   Oppositional/Defiant Behaviors:  None   Emotional Irregularity:  Chronic feelings of emptiness; Mood lability; Recurrent suicidal behaviors/gestures/threats (UTA)   Other Mood/Personality Symptoms:  N/A    Mental Status Exam Appearance and self-care  Stature:  Small   Weight:  Overweight   Clothing:  Casual   Grooming:  Normal   Cosmetic use:  None   Posture/gait:  Other (Comment) (Pt laying in hospital bed.)   Motor activity:  Not Remarkable   Sensorium  Attention:  Distractible   Concentration:  -- (Poor.)   Orientation:  Person; Place   Recall/memory:  Defective in Immediate   Affect and Mood  Affect:  Flat   Mood:  Other (Comment) (Sad.)  Relating  Eye contact:  Avoided   Facial expression:  Sad   Attitude toward examiner:  Guarded   Thought and Language  Speech flow: Normal   Thought content:  Appropriate to Mood and Circumstances   Preoccupation:  None   Hallucinations:  None   Organization:  Circumstantial   Company secretary of Knowledge:  Poor   Intelligence:  Average   Abstraction:  Normal   Judgement:  Poor   Reality Testing:  Adequate   Insight:  Lacking   Decision Making:  Impulsive   Social Functioning  Social Maturity:  Irresponsible   Social Judgement:  Chief of Staff   Stress  Stressors:  Other (Comment) (Pt reports, he does not know.)   Coping Ability:  Overwhelmed   Skill Deficits:   Decision making; Communication; Responsibility; Self-control   Supports:  Family; Friends/Service system     Religion: Religion/Spirituality Are You A Religious Person?: No (Spiritual.) How Might This Affect Treatment?: N/A  Leisure/Recreation: Leisure / Recreation Do You Have Hobbies?: No  Exercise/Diet: Exercise/Diet Do You Exercise?: No Have You Gained or Lost A Significant Amount of Weight in the Past Six Months?: No Do You Follow a Special Diet?: No Do You Have Any Trouble Sleeping?: No   CCA Employment/Education Employment/Work Situation: Employment / Work Situation Employment Situation: Unemployed Patient's Job has Been Impacted by Current Illness: No (Patient has seen the bad effects on his life due to drinking) Has Patient ever Been in the [employer]?: No  Education: Education Is Patient Currently Attending School?: No Last Grade Completed: 12 (UTA) Did You Attend College?: No (UTA) Did You Have An Individualized Education Program (IIEP): No (UTA) Did You Have Any Difficulty At School?: No (UTA) Patient's Education Has Been Impacted by Current Illness: No   CCA Family/Childhood History Family and Relationship History: Family history Does patient have children?: Yes  Childhood History:  Childhood History By whom was/is the patient raised?: Both parents Did patient suffer any verbal/emotional/physical/sexual abuse as a child?: No Did patient suffer from severe childhood neglect?: No Has patient ever been sexually abused/assaulted/raped as an adolescent or adult?: No Was the patient ever a victim of a crime or a disaster?: No Witnessed domestic violence?: No Has patient been affected by domestic violence as an adult?: No       CCA Substance Use Alcohol/Drug Use: Alcohol / Drug Use Pain Medications: See MAR Prescriptions: See MAR Over the Counter: See MAR History of alcohol / drug use?: Yes Longest period of sobriety (when/how long): UTA Negative  Consequences of Use: Personal relationships, Financial Withdrawal Symptoms: Other (Comment) (UTA) Substance #1 Name of Substance 1: ETOH 1 - Amount (size/oz): 8 shots 1 - Frequency: not often 1 - Last Use / Amount: 07/11/24                       ASAM's:  Six Dimensions of Multidimensional Assessment  Dimension 1:  Acute Intoxication and/or Withdrawal Potential:   Dimension 1:  Description of individual's past and current experiences of substance use and withdrawal: Unsure.  Dimension 2:  Biomedical Conditions and Complications:   Dimension 2:  Description of patient's biomedical conditions and  complications: Unsure.  Dimension 3:  Emotional, Behavioral, or Cognitive Conditions and Complications:  Dimension 3:  Description of emotional, behavioral, or cognitive conditions and complications: Unsure.  Dimension 4:  Readiness to Change:  Dimension 4:  Description of Readiness to Change criteria: Unsure.  Dimension 5:  Relapse, Continued use,  or Continued Problem Potential:  Dimension 5:  Relapse, continued use, or continued problem potential critiera description: Unsure.  Dimension 6:  Recovery/Living Environment:  Dimension 6:  Recovery/Iiving environment criteria description: Unsure.  ASAM Severity Score: ASAM's Severity Rating Score: 6  ASAM Recommended Level of Treatment: ASAM Recommended Level of Treatment: Level II Partial Hospitalization Treatment   Substance use Disorder (SUD) Substance Use Disorder (SUD)  Checklist Symptoms of Substance Use: Persistent desire or unsuccessful efforts to cut down or control use  Recommendations for Services/Supports/Treatments: Recommendations for Services/Supports/Treatments Recommendations For Services/Supports/Treatments: Inpatient Hospitalization  Disposition Recommendation per psychiatric provider: Pt is recommended for [facility]   DSM5 Diagnoses: Patient Active Problem List   Diagnosis Date Noted   Malingering 07/11/2024   MDD  (major depressive disorder), recurrent severe, without psychosis (HCC) 04/25/2024   Suicidal ideation    Alcohol withdrawal without perceptual disturbances (HCC) 04/08/2021   Alcohol use disorder, severe, dependence (HCC) 04/08/2021   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

## 2024-07-11 NOTE — ED Provider Notes (Signed)
 Emergency Medicine Observation Re-evaluation Note  Aaron [name] is a 48 y.o. male, seen on rounds today.  Pt initially presented to the ED for complaints of Alcohol Intoxication and Suicidal Currently, the patient is resting.  Physical Exam  BP 109/78   Pulse 83   Temp 97.9 F (36.6 C) (Oral)   Resp 16   Ht 5' 2 (1.575 m)   Wt 75 kg   SpO2 99%   BMI 30.24 kg/m  Physical Exam General: NAD   ED Course / MDM  EKG:   I have reviewed the labs performed to date as well as medications administered while in observation.    Plan  Current plan is for pending TTS evaluation.    Laurice Maude BROCKS, MD 07/11/24 [phone]

## 2024-07-11 NOTE — ED Notes (Signed)
 PT verbalized understanding of DC instructions.Pt amb out of ED with all belongings. Provided with bus pass and got belongings from security.

## 2024-07-11 NOTE — Progress Notes (Signed)
 CSW added substance abuse resources to patient's AVS.  Niels Portugal, MSW, LCSW Transitions of Care  Clinical Social Worker II [phone]

## 2024-07-11 NOTE — ED Provider Notes (Signed)
 [place] EMERGENCY DEPARTMENT AT [facility] Provider Note   CSN: [id number] Arrival date & time: 07/10/24  2103     Patient presents with: Alcohol Intoxication and Suicidal   Magnum Lunde III is a 48 y.o. male presents today reporting that he is homicidal and suicidal.  Patient reports plan to shoot himself and states that he does have access to guns.  Patient reports drinking a shooters of Bacardi tonight and mild headache.  Patient denies AVH, chest pain, shortness of breath, any other complaints at this time.    Alcohol Intoxication       Prior to Admission medications   Medication Sig Start Date End Date Taking? Authorizing Provider  ARIPiprazole  (ABILIFY ) 15 MG tablet Take 1 tablet (15 mg total) by mouth daily. 05/02/24   Zouev, Dmitri, MD  doxycycline  (VIBRAMYCIN ) 100 MG capsule Take 1 capsule (100 mg total) by mouth 2 (two) times daily. 07/09/24   Jarold Olam HERO, PA-C  metFORMIN  (GLUCOPHAGE -XR) 500 MG 24 hr tablet Take 1 tablet (500 mg total) by mouth daily with breakfast. 05/02/24   Zouev, Dmitri, MD  naltrexone  (DEPADE) 50 MG tablet Take 1 tablet (50 mg total) by mouth daily. 05/02/24   Zouev, Dmitri, MD  nicotine  (NICODERM CQ  - DOSED IN MG/24 HOURS) 21 mg/24hr patch Place 1 patch (21 mg total) onto the skin daily at 6 (six) AM. 05/02/24   Zouev, Dmitri, MD  sertraline  (ZOLOFT ) 100 MG tablet Take 1 tablet (100 mg total) by mouth at bedtime. 05/01/24   Zouev, Dmitri, MD  simvastatin  (ZOCOR ) 10 MG tablet Take 1 tablet (10 mg total) by mouth at bedtime. 05/01/24   Zouev, Dmitri, MD  traZODone  (DESYREL ) 100 MG tablet Take 2 tablets (200 mg total) by mouth at bedtime as needed for sleep (insomnia). 05/01/24   Zouev, Dmitri, MD    Allergies: Patient has no known allergies.    Review of Systems  Psychiatric/Behavioral:  Positive for suicidal ideas.     Updated Vital Signs BP 109/78 (BP Location: Left Arm)   Pulse 83   Temp 97.9 F (36.6 C) (Oral)   Resp 16    Ht 5' 2 (1.575 m)   Wt 75 kg   SpO2 99%   BMI 30.24 kg/m   Physical Exam  (all labs ordered are listed, but only abnormal results are displayed) Labs Reviewed  COMPREHENSIVE METABOLIC PANEL WITH GFR - Abnormal; Notable for the following components:      Result Value   CO2 20 (*)    ALT 49 (*)    All other components within normal limits  ETHANOL - Abnormal; Notable for the following components:   Alcohol, Ethyl (B) 106 (*)    All other components within normal limits  CBC  RAPID URINE DRUG SCREEN, HOSP PERFORMED    EKG: None  Radiology: No results found.   Procedures   Medications Ordered in the ED  ibuprofen  (ADVIL ) tablet 800 mg (has no administration in time range)  LORazepam  (ATIVAN ) tablet 1-4 mg (has no administration in time range)    Or  LORazepam  (ATIVAN ) injection 1-4 mg (has no administration in time range)  thiamine  (VITAMIN B1) tablet 100 mg (has no administration in time range)    Or  thiamine  (VITAMIN B1) injection 100 mg (has no administration in time range)  folic acid  (FOLVITE ) tablet 1 mg (has no administration in time range)  multivitamin with minerals tablet 1 tablet (has no administration in time range)  Medical Decision Making Risk Prescription drug management.   This patient presents to the ED for concern of SI and HI, this involves an extensive number of treatment options, and is a complaint that carries with it a high risk of complications and morbidity.  The differential diagnosis includes suicidal ideation, homicidal ideation, AVH   Lab Tests:  I Ordered, and personally interpreted labs.  The pertinent results include: UDS negative, mildly reduced CO2 at 20, mildly elevated ALT at 49, CBC WNL  Problem List / ED Course / Critical interventions / Medication management I ordered medication including ibuprofen  I have reviewed the patients home medicines and have made adjustments as needed CIWA  protocols  Test / Admission - Considered:  Consulted TTS which will determine patient dispo.     Final diagnoses:  Suicidal ideations    ED Discharge Orders     None          Francis Ileana LOISE DEVONNA 07/11/24 0541    Haze Lonni PARAS, MD 07/11/24 [phone]

## 2024-07-11 NOTE — ED Notes (Signed)
 Pt resting in room Sitter in place, pt calm cooperative denies further needs at this time. CIWA 0

## 2024-07-11 NOTE — Discharge Instructions (Signed)
   Outpatient Substance Abuse  Treatment- uninsured  Narcotics Anonymous 24-HOUR HELPLINE Pre-recorded for Meeting Schedules [facility] [phone]  [email or web address] ALCOHOLICS ANONYMOUS  [place]   Answering Service [phone] Please Note: All [place] Meetings are Non-smoking [email or web address]  Alcohol and Drug Services -  Insurance: Medicaid /State funding/private insurance Methadone, suboxone/Intensive outpatient  [place] [phone] Fax: [phone] [address] [place], [place], [address] [place] [phone] Fax: [phone]    [address] [place], [place], [address] ([address] [place], [place], [place], [place], [place], [place], [place], [place]) [facility] [email or web address] Accepts State funding/Medicaid Transitional housing, Intensive Outpatient Treatment, Outpatient treatment, Veterans Services  Phone: [phone] Fax: [phone] Address: [address] [place] [address]   [employer] of Care ([email or web address].[email or web address]/) Insurance: Medicaid Case Management, Administrator, arts, Medication Management, Outpatient Therapy, Psychosocial Rehabilitation, Substance Abuse Intensive Outpatient  Phone: [phone] Fax: [name], [place], [place], [address]  [facility] Medicaid, most private insurance providers Types of Program: Individual/Group Therapy, Substance Abuse Treatment  Phone: [place] [phone] Fax: [phone] [address], [place], [place], [address] [place] [phone] [address], [address] [place], [place], [address]  [facility]  Medicaid Types of Program: SAIOP  Phone: [phone] Fax: [phone] [address] [place], [place], [place], [address] [facility] Medicaid/state funds Crisis line [phone] [place] [facility] [phone] [place] [phone] [place] [phone]  [facility] [address];  [place], [place] [address] [phone]  Substance Abuse Intensive Outpatient Program [facility] [address] [place], [place] [address] [phone]- Substance abuse treatment  Successful Transitions  Insurance: [facility], [address], sliding scale Types of Program: substance abuse treatment, transportation assistance Phone: [phone] Fax: [phone] Address: [address], [place] [address] [facility] ([email or web address]) Insurance: UHC, [place], [place], [place] of [place] Program: addiction counseling, detoxification,  Phone: [phone]  Fax: [phone] Address: [address], [place] [address]  [facility] (statewide facilities/programs) [address] (Medicaid/state funds) [place], [place] [address]                      [email or web address] [phone] Aaron [name]- [phone] [place]- [phone] [facility] [place] (2 Locations) (Medicaid/state funds) --[address]  walk in 8:30-12 and 1-2:30 [place], [id number]   [facility]- [phone] --[address] [place], [place] [address]  [phone] walk in 8:30-12 and 2-3:30  [facility] state funds/medicaid [address] [place], [place] [address] [id number] [facility] (Suboxone clinic) Medicaid/state funds  [address] [place], [place] [address] [phone]   [facility]  [address] [place], [place] [address]  [phone] (24 hours) [place]- [address] [place], [place] [address]  [phone] (24 hours) [place]- [address] [place] [phone] [place]- [address] [place] [phone] [place] [address] Aaron [name] Aaron [name] [phone] [facility]- Medicaid and state funds  [place]- [address] [place], [place] [address] [phone] (24 hours) [place]- [address] [place], [place]  [phone] [facility]- [address] [place], [place] [address] [phone] (24 hours) [place] [address] [place], [place]  [phone] [place]- [address] [place] [phone]

## 2024-07-12 ENCOUNTER — Other Ambulatory Visit: Payer: Self-pay

## 2024-07-12 ENCOUNTER — Encounter (HOSPITAL_COMMUNITY): Payer: Self-pay | Admitting: Nurse Practitioner

## 2024-07-12 ENCOUNTER — Other Ambulatory Visit (HOSPITAL_COMMUNITY)
Admission: EM | Admit: 2024-07-12 | Discharge: 2024-07-16 | Disposition: A | Discharge status: Rehabilitation Facility | Source: Intra-hospital | Attending: Psychiatry | Admitting: Psychiatry

## 2024-07-12 DIAGNOSIS — Z79899 Other long term (current) drug therapy: Secondary | ICD-10-CM | POA: Diagnosis not present

## 2024-07-12 DIAGNOSIS — Z560 Unemployment, unspecified: Secondary | ICD-10-CM | POA: Diagnosis not present

## 2024-07-12 DIAGNOSIS — F1094 Alcohol use, unspecified with alcohol-induced mood disorder: Secondary | ICD-10-CM

## 2024-07-12 DIAGNOSIS — Z5901 Sheltered homelessness: Secondary | ICD-10-CM | POA: Diagnosis not present

## 2024-07-12 DIAGNOSIS — R45851 Suicidal ideations: Secondary | ICD-10-CM | POA: Diagnosis not present

## 2024-07-12 DIAGNOSIS — F17210 Nicotine dependence, cigarettes, uncomplicated: Secondary | ICD-10-CM | POA: Diagnosis not present

## 2024-07-12 DIAGNOSIS — Z5900 Homelessness unspecified: Secondary | ICD-10-CM | POA: Diagnosis not present

## 2024-07-12 DIAGNOSIS — Y906 Blood alcohol level of 120-199 mg/100 ml: Secondary | ICD-10-CM | POA: Diagnosis not present

## 2024-07-12 DIAGNOSIS — Z652 Problems related to release from prison: Secondary | ICD-10-CM | POA: Insufficient documentation

## 2024-07-12 DIAGNOSIS — F10229 Alcohol dependence with intoxication, unspecified: Secondary | ICD-10-CM | POA: Diagnosis not present

## 2024-07-12 DIAGNOSIS — F1024 Alcohol dependence with alcohol-induced mood disorder: Secondary | ICD-10-CM | POA: Insufficient documentation

## 2024-07-12 DIAGNOSIS — E119 Type 2 diabetes mellitus without complications: Secondary | ICD-10-CM | POA: Diagnosis not present

## 2024-07-12 DIAGNOSIS — E785 Hyperlipidemia, unspecified: Secondary | ICD-10-CM | POA: Insufficient documentation

## 2024-07-12 DIAGNOSIS — Z7984 Long term (current) use of oral hypoglycemic drugs: Secondary | ICD-10-CM | POA: Diagnosis not present

## 2024-07-12 DIAGNOSIS — F419 Anxiety disorder, unspecified: Secondary | ICD-10-CM | POA: Diagnosis not present

## 2024-07-12 DIAGNOSIS — F840 Autistic disorder: Secondary | ICD-10-CM | POA: Diagnosis not present

## 2024-07-12 DIAGNOSIS — F10120 Alcohol abuse with intoxication, uncomplicated: Secondary | ICD-10-CM | POA: Diagnosis present

## 2024-07-12 DIAGNOSIS — F333 Major depressive disorder, recurrent, severe with psychotic symptoms: Secondary | ICD-10-CM | POA: Insufficient documentation

## 2024-07-12 LAB — POCT URINE DRUG SCREEN - MANUAL ENTRY (I-SCREEN)
POC Amphetamine UR: NOT DETECTED
POC Buprenorphine (BUP): NOT DETECTED
POC Cocaine UR: NOT DETECTED
POC Marijuana UR: NOT DETECTED
POC Methadone UR: NOT DETECTED
POC Methamphetamine UR: NOT DETECTED
POC Morphine: NOT DETECTED
POC Oxazepam (BZO): NOT DETECTED
POC Oxycodone UR: NOT DETECTED
POC Secobarbital (BAR): NOT DETECTED

## 2024-07-12 LAB — ETHANOL: Alcohol, Ethyl (B): 143 mg/dL — ABNORMAL HIGH (ref <15)

## 2024-07-12 MED ORDER — HALOPERIDOL LACTATE 5 MG/ML IJ SOLN: 10.0000 mg | Freq: Three times a day (TID) | INTRAMUSCULAR | Status: DC | PRN

## 2024-07-12 MED ORDER — HYDROXYZINE HCL 25 MG PO TABS
25.0000 mg | ORAL_TABLET | Freq: Four times a day (QID) | ORAL | Status: AC | PRN
  Administered 2024-07-12: 25 mg via ORAL
  Filled 2024-07-12: qty 1

## 2024-07-12 MED ORDER — CHLORDIAZEPOXIDE HCL 25 MG PO CAPS
25.0000 mg | ORAL_CAPSULE | Freq: Every day | ORAL | Status: AC
  Administered 2024-07-15: 25 mg via ORAL
  Filled 2024-07-12: qty 1

## 2024-07-12 MED ORDER — LOPERAMIDE HCL 2 MG PO CAPS: 2.0000 mg | ORAL_CAPSULE | ORAL | Status: AC | PRN

## 2024-07-12 MED ORDER — ACETAMINOPHEN 325 MG PO TABS
650.0000 mg | ORAL_TABLET | Freq: Four times a day (QID) | ORAL | Status: DC | PRN
  Administered 2024-07-12: 650 mg via ORAL
  Filled 2024-07-12: qty 2

## 2024-07-12 MED ORDER — ONDANSETRON 4 MG PO TBDP: 4.0000 mg | ORAL_TABLET | Freq: Four times a day (QID) | ORAL | Status: AC | PRN

## 2024-07-12 MED ORDER — THIAMINE HCL 100 MG/ML IJ SOLN: 100.0000 mg | Freq: Once | INTRAMUSCULAR | Status: DC

## 2024-07-12 MED ORDER — LORAZEPAM 2 MG/ML IJ SOLN: 2.0000 mg | Freq: Three times a day (TID) | INTRAMUSCULAR | Status: DC | PRN

## 2024-07-12 MED ORDER — MAGNESIUM HYDROXIDE 400 MG/5ML PO SUSP: 30.0000 mL | Freq: Every day | ORAL | Status: DC | PRN

## 2024-07-12 MED ORDER — TRAZODONE HCL 50 MG PO TABS
50.0000 mg | ORAL_TABLET | Freq: Every day | ORAL | Status: DC
  Administered 2024-07-12 – 2024-07-15 (×4): 50 mg via ORAL
  Filled 2024-07-12 (×3): qty 1
  Filled 2024-07-12: qty 7
  Filled 2024-07-12: qty 1

## 2024-07-12 MED ORDER — HALOPERIDOL LACTATE 5 MG/ML IJ SOLN: 5.0000 mg | Freq: Three times a day (TID) | INTRAMUSCULAR | Status: DC | PRN

## 2024-07-12 MED ORDER — CHLORDIAZEPOXIDE HCL 25 MG PO CAPS: 25.0000 mg | ORAL_CAPSULE | Freq: Four times a day (QID) | ORAL | Status: AC | PRN

## 2024-07-12 MED ORDER — ARIPIPRAZOLE 5 MG PO TABS
5.0000 mg | ORAL_TABLET | Freq: Every day | ORAL | Status: DC
Start: 2024-07-12 — End: 2024-07-16
  Administered 2024-07-12 – 2024-07-15 (×4): 5 mg via ORAL
  Filled 2024-07-12 (×4): qty 1
  Filled 2024-07-12: qty 7

## 2024-07-12 MED ORDER — METFORMIN HCL ER 500 MG PO TB24
500.0000 mg | ORAL_TABLET | Freq: Every day | ORAL | Status: DC
  Administered 2024-07-13 – 2024-07-16 (×4): 500 mg via ORAL
  Filled 2024-07-12 (×4): qty 1
  Filled 2024-07-12: qty 7

## 2024-07-12 MED ORDER — SERTRALINE HCL 50 MG PO TABS
50.0000 mg | ORAL_TABLET | Freq: Every day | ORAL | Status: DC
  Administered 2024-07-12 – 2024-07-15 (×4): 50 mg via ORAL
  Filled 2024-07-12: qty 7
  Filled 2024-07-12 (×4): qty 1

## 2024-07-12 MED ORDER — DIPHENHYDRAMINE HCL 50 MG PO CAPS: 50.0000 mg | ORAL_CAPSULE | Freq: Three times a day (TID) | ORAL | Status: DC | PRN

## 2024-07-12 MED ORDER — ALUM & MAG HYDROXIDE-SIMETH 200-200-20 MG/5ML PO SUSP: 30.0000 mL | ORAL | Status: DC | PRN

## 2024-07-12 MED ORDER — DIPHENHYDRAMINE HCL 50 MG/ML IJ SOLN: 50.0000 mg | Freq: Three times a day (TID) | INTRAMUSCULAR | Status: DC | PRN

## 2024-07-12 MED ORDER — SIMVASTATIN 5 MG PO TABS
10.0000 mg | ORAL_TABLET | Freq: Every day | ORAL | Status: DC
  Administered 2024-07-12 – 2024-07-15 (×4): 10 mg via ORAL
  Filled 2024-07-12 (×4): qty 2
  Filled 2024-07-12: qty 14

## 2024-07-12 MED ORDER — CHLORDIAZEPOXIDE HCL 25 MG PO CAPS
25.0000 mg | ORAL_CAPSULE | Freq: Four times a day (QID) | ORAL | Status: AC
  Administered 2024-07-12 (×4): 25 mg via ORAL
  Filled 2024-07-12 (×4): qty 1

## 2024-07-12 MED ORDER — ADULT MULTIVITAMIN W/MINERALS CH
1.0000 | ORAL_TABLET | Freq: Every day | ORAL | Status: DC
  Administered 2024-07-12 – 2024-07-16 (×5): 1 via ORAL
  Filled 2024-07-12 (×5): qty 1

## 2024-07-12 MED ORDER — HALOPERIDOL 5 MG PO TABS: 5.0000 mg | ORAL_TABLET | Freq: Three times a day (TID) | ORAL | Status: DC | PRN

## 2024-07-12 MED ORDER — CHLORDIAZEPOXIDE HCL 25 MG PO CAPS
25.0000 mg | ORAL_CAPSULE | Freq: Three times a day (TID) | ORAL | Status: AC
  Administered 2024-07-13 (×3): 25 mg via ORAL
  Filled 2024-07-12 (×3): qty 1

## 2024-07-12 MED ORDER — SERTRALINE HCL 25 MG PO TABS: 25.0000 mg | ORAL_TABLET | Freq: Every day | ORAL | Status: DC

## 2024-07-12 MED ORDER — METFORMIN HCL ER 500 MG PO TB24: 500.0000 mg | ORAL_TABLET | Freq: Every day | ORAL | Status: DC

## 2024-07-12 MED ORDER — CHLORDIAZEPOXIDE HCL 25 MG PO CAPS
25.0000 mg | ORAL_CAPSULE | ORAL | Status: AC
  Administered 2024-07-14 (×2): 25 mg via ORAL
  Filled 2024-07-12 (×2): qty 1

## 2024-07-12 NOTE — ED Provider Notes (Signed)
 Behavioral Health Progress Note  Date and Time: 07/12/2024 12:07 PM Name: Aaron [name] MRN:  [id number]  ID: 48 year old male with psychiatric history of MDD with psychosis, Anxiety, malingering, alcohol use disorder, severe dependence, suicidal ideations, who presented voluntarily as a walk-in to [facility] with complaints of worsening depression, SI, with a plan to shoot himself and HI without plan/intent.   Subjective:  Patient well known to me from prior admission  at [facility] in 04/2024. He did well on combination of Zoloft  and Abilify . Patient reprots worsening depression in context of medication noncompliance and relapse on alcohol abuse. Tolerating Librium  taper well with CIWA of 0 today. Patient reports SI with a plan to shoot himself. Denies any intent and is interested in detoxing and restarting his prior psychotropic regiment which he found helpful. Reports feeling hopeless, low energy and anhedonia, poor concentration, insomnia and poor appetite the past month. Denies AVH at time of evaluation. States he is homeless. States he has been drinking 8 shots of liquor daily and smoking a pack of cigarettes a day.  Had suicidal gestures of holding a gun to his head in the past but no longer has access to gun  Diagnosis:  Final diagnoses:  Alcohol abuse with intoxication, uncomplicated (HCC)  Suicidal ideations  Homelessness  Alcohol-induced mood disorder with depressive symptoms (HCC)    Total Time spent with patient: 30 minutes  Past Psychiatric History:  Previous Psych Diagnoses:  alcohol use disorder, depression Prior inpatient treatment: reports 6 admissions throughout his life, was admitted to [facility] last year fo Current/prior outpatient treatment: poor outpatient followup Prior rehab hx: denies Psychotherapy hx: denies History of suicide: denies but had suicidal gestures of holding a gun to his head in the past but no longer has access to gun History of homicide:  denies Psychiatric medication history: Zoloft , Abilify , naltrexone  Psychiatric medication compliance history: poor  Substance Abuse Hx: Alcohol: patient with 4 DUIS, states he has had blackouts previously, patient states for the past week he had been drinking 8 shots a day, multiple drinks at the bar Tobacco: reports 2-3 cigarettes a day, declines patch  Illicit drugs: denies Rx drug abuse:  denies Rehab hx:  denies   Past Medical History: Medical Diagnoses: DM2, HLD Head trauma, LOC, concussions, seizures: denies Allergies: NKDA     Family History: Psych: denies Psych Rx: denies SA/HA: denies  Social History: Patient has a daughter in [place], [place] . Patient's mother is in [place]  Additional Social History:                         Sleep: Poor  Appetite:  Poor  Current Medications:  Current Facility-Administered Medications  Medication Dose Route Frequency Provider Last Rate Last Admin   acetaminophen  (TYLENOL ) tablet 650 mg  650 mg Oral Q6H PRN Onuoha, Chinwendu V, NP   650 mg at 07/12/24 0208   alum & mag hydroxide-simeth (MAALOX/MYLANTA) 200-200-20 MG/5ML suspension 30 mL  30 mL Oral Q4H PRN Onuoha, Chinwendu V, NP       chlordiazePOXIDE  (LIBRIUM ) capsule 25 mg  25 mg Oral Q6H PRN Onuoha, Chinwendu V, NP       chlordiazePOXIDE  (LIBRIUM ) capsule 25 mg  25 mg Oral QID Onuoha, Chinwendu V, NP   25 mg at 07/12/24 1029   Followed by   NOREEN ON 07/13/2024] chlordiazePOXIDE  (LIBRIUM ) capsule 25 mg  25 mg Oral TID Onuoha, Chinwendu V, NP       Followed by   [  START ON 07/14/2024] chlordiazePOXIDE  (LIBRIUM ) capsule 25 mg  25 mg Oral BH-qamhs Onuoha, Chinwendu V, NP       Followed by   NOREEN ON 07/15/2024] chlordiazePOXIDE  (LIBRIUM ) capsule 25 mg  25 mg Oral Daily Onuoha, Chinwendu V, NP       haloperidol  (HALDOL ) tablet 5 mg  5 mg Oral TID PRN Onuoha, Chinwendu V, NP       And   diphenhydrAMINE  (BENADRYL ) capsule 50 mg  50 mg Oral TID PRN Onuoha, Chinwendu V, NP        haloperidol  lactate (HALDOL ) injection 5 mg  5 mg Intramuscular TID PRN Onuoha, Chinwendu V, NP       And   diphenhydrAMINE  (BENADRYL ) injection 50 mg  50 mg Intramuscular TID PRN Onuoha, Chinwendu V, NP       And   LORazepam  (ATIVAN ) injection 2 mg  2 mg Intramuscular TID PRN Onuoha, Chinwendu V, NP       haloperidol  lactate (HALDOL ) injection 10 mg  10 mg Intramuscular TID PRN Onuoha, Chinwendu V, NP       And   diphenhydrAMINE  (BENADRYL ) injection 50 mg  50 mg Intramuscular TID PRN Onuoha, Chinwendu V, NP       And   LORazepam  (ATIVAN ) injection 2 mg  2 mg Intramuscular TID PRN Onuoha, Chinwendu V, NP       hydrOXYzine  (ATARAX ) tablet 25 mg  25 mg Oral Q6H PRN Onuoha, Chinwendu V, NP   25 mg at 07/12/24 0208   loperamide  (IMODIUM ) capsule 2-4 mg  2-4 mg Oral PRN Onuoha, Chinwendu V, NP       magnesium  hydroxide (MILK OF MAGNESIA) suspension 30 mL  30 mL Oral Daily PRN Onuoha, Chinwendu V, NP       multivitamin with minerals tablet 1 tablet  1 tablet Oral Daily Onuoha, Chinwendu V, NP   1 tablet at 07/12/24 1029   ondansetron  (ZOFRAN -ODT) disintegrating tablet 4 mg  4 mg Oral Q6H PRN Onuoha, Chinwendu V, NP       sertraline  (ZOLOFT ) tablet 25 mg  25 mg Oral QHS Onuoha, Chinwendu V, NP       Current Outpatient Medications  Medication Sig Dispense Refill   ARIPiprazole  (ABILIFY ) 15 MG tablet Take 1 tablet (15 mg total) by mouth daily. (Patient not taking: Reported on 07/11/2024) 30 tablet 0   doxycycline  (VIBRAMYCIN ) 100 MG capsule Take 1 capsule (100 mg total) by mouth 2 (two) times daily. (Patient not taking: Reported on 07/11/2024) 20 capsule 0   metFORMIN  (GLUCOPHAGE -XR) 500 MG 24 hr tablet Take 1 tablet (500 mg total) by mouth daily with breakfast. (Patient not taking: Reported on 07/11/2024) 30 tablet 0   naltrexone  (DEPADE) 50 MG tablet Take 1 tablet (50 mg total) by mouth daily. (Patient not taking: Reported on 07/11/2024) 30 tablet 0   sertraline  (ZOLOFT ) 100 MG tablet Take 1  tablet (100 mg total) by mouth at bedtime. (Patient not taking: Reported on 07/11/2024) 30 tablet 0   simvastatin  (ZOCOR ) 10 MG tablet Take 1 tablet (10 mg total) by mouth at bedtime. (Patient not taking: Reported on 07/11/2024) 30 tablet 0   traZODone  (DESYREL ) 100 MG tablet Take 2 tablets (200 mg total) by mouth at bedtime as needed for sleep (insomnia). (Patient not taking: Reported on 07/11/2024) 60 tablet 0    Labs  Lab Results:  Admission on 07/12/2024  Component Date Value Ref Range Status   Alcohol, Ethyl (B) 07/12/2024 143 (H)  <15 mg/dL Final  Comment: (NOTE) For medical purposes only. Performed at [facility], [address] [place], [place] [address]    POC Amphetamine UR 07/12/2024 None Detected  NONE DETECTED (Cut Off Level 1000 ng/mL) Final   POC Secobarbital (BAR) 07/12/2024 None Detected  NONE DETECTED (Cut Off Level 300 ng/mL) Final   POC Buprenorphine (BUP) 07/12/2024 None Detected  NONE DETECTED (Cut Off Level 10 ng/mL) Final   POC Oxazepam (BZO) 07/12/2024 None Detected  NONE DETECTED (Cut Off Level 300 ng/mL) Final   POC Cocaine UR 07/12/2024 None Detected  NONE DETECTED (Cut Off Level 300 ng/mL) Final   POC Methamphetamine UR 07/12/2024 None Detected  NONE DETECTED (Cut Off Level 1000 ng/mL) Final   POC Morphine 07/12/2024 None Detected  NONE DETECTED (Cut Off Level 300 ng/mL) Final   POC Methadone UR 07/12/2024 None Detected  NONE DETECTED (Cut Off Level 300 ng/mL) Final   POC Oxycodone UR 07/12/2024 None Detected  NONE DETECTED (Cut Off Level 100 ng/mL) Final   POC Marijuana UR 07/12/2024 None Detected  NONE DETECTED (Cut Off Level 50 ng/mL) Final  Admission on 07/10/2024, Discharged on 07/11/2024  Component Date Value Ref Range Status   Sodium 07/10/2024 140  135 - 145 mmol/L Final   Potassium 07/10/2024 3.6  3.5 - 5.1 mmol/L Final   Chloride 07/10/2024 105  98 - 111 mmol/L Final   CO2 07/10/2024 20 (L)  22 - 32 mmol/L Final   Glucose, Bld 07/10/2024 98   70 - 99 mg/dL Final   Glucose reference range applies only to samples taken after fasting for at least 8 hours.   BUN 07/10/2024 9  6 - 20 mg/dL Final   Creatinine, Ser 07/10/2024 0.63  0.61 - 1.24 mg/dL Final   Calcium  07/10/2024 8.9  8.9 - 10.3 mg/dL Final   Total Protein 91/79/7974 7.3  6.5 - 8.1 g/dL Final   Albumin 91/79/7974 3.7  3.5 - 5.0 g/dL Final   AST 91/79/7974 28  15 - 41 U/L Final   ALT 07/10/2024 49 (H)  0 - 44 U/L Final   Alkaline Phosphatase 07/10/2024 65  38 - 126 U/L Final   Total Bilirubin 07/10/2024 0.6  0.0 - 1.2 mg/dL Final   GFR, Estimated 07/10/2024 >60  >60 mL/min Final   Comment: (NOTE) Calculated using the CKD-EPI Creatinine Equation (2021)    Anion gap 07/10/2024 15  5 - 15 Final   Performed at [facility], [address] [place], [place] [address]   Alcohol, Ethyl (B) 07/10/2024 106 (H)  <15 mg/dL Final   Comment: (NOTE) For medical purposes only. Performed at [facility], [address] [place], [place] [address]    WBC 07/10/2024 6.4  4.0 - 10.5 K/uL Final   RBC 07/10/2024 4.85  4.22 - 5.81 MIL/uL Final   Hemoglobin 07/10/2024 14.1  13.0 - 17.0 g/dL Final   HCT 91/79/7974 43.2  39.0 - 52.0 % Final   MCV 07/10/2024 89.1  80.0 - 100.0 fL Final   MCH 07/10/2024 29.1  26.0 - 34.0 pg Final   MCHC 07/10/2024 32.6  30.0 - 36.0 g/dL Final   RDW 91/79/7974 14.0  11.5 - 15.5 % Final   Platelets 07/10/2024 368  150 - 400 K/uL Final   nRBC 07/10/2024 0.0  0.0 - 0.2 % Final   Performed at [facility], [address], [place], [place] [address]   Opiates 07/10/2024 NONE DETECTED  NONE DETECTED Final  Cocaine 07/10/2024 NONE DETECTED  NONE DETECTED Final   Benzodiazepines 07/10/2024 NONE DETECTED  NONE DETECTED Final   Amphetamines 07/10/2024 NONE DETECTED  NONE DETECTED Final   Tetrahydrocannabinol 07/10/2024 NONE DETECTED  NONE DETECTED Final   Barbiturates 07/10/2024 NONE DETECTED  NONE DETECTED Final   Comment: (NOTE) DRUG SCREEN  FOR MEDICAL PURPOSES ONLY.  IF CONFIRMATION IS NEEDED FOR ANY PURPOSE, NOTIFY LAB WITHIN 5 DAYS.  LOWEST DETECTABLE LIMITS FOR URINE DRUG SCREEN Drug Class                     Cutoff (ng/mL) Amphetamine and metabolites    1000 Barbiturate and metabolites    200 Benzodiazepine                 200 Opiates and metabolites        300 Cocaine and metabolites        300 THC                            50 Performed at [facility], [address], [place], [place] [address]   Admission on 04/25/2024, Discharged on 05/01/2024  Component Date Value Ref Range Status   Hgb A1c MFr Bld 04/27/2024 5.6  4.8 - 5.6 % Final   Comment: (NOTE)         Prediabetes: 5.7 - 6.4         Diabetes: >6.4         Glycemic control for adults with diabetes: <7.0    Mean Plasma Glucose 04/27/2024 114  mg/dL Final   Comment: (NOTE) Performed At: [facility] [address] [place], [place] [id number] Jennette Shorter MD Ey:[id number]    Cholesterol 04/27/2024 204 (H)  0 - 200 mg/dL Final   Triglycerides 93/92/7974 242 (H)  <150 mg/dL Final   HDL 93/92/7974 34 (L)  >40 mg/dL Final   Total CHOL/HDL Ratio 04/27/2024 6.0  RATIO Final   VLDL 04/27/2024 48 (H)  0 - 40 mg/dL Final   LDL Cholesterol 04/27/2024 122 (H)  0 - 99 mg/dL Final   Comment:        Total Cholesterol/HDL:CHD Risk Coronary Heart Disease Risk Table                     Men   Women  1/2 Average Risk   3.4   3.3  Average Risk       5.0   4.4  2 X Average Risk   9.6   7.1  3 X Average Risk  23.4   11.0        Use the calculated Patient Ratio above and the CHD Risk Table to determine the patient's CHD Risk.        ATP III CLASSIFICATION (LDL):  <100     mg/dL   Optimal  899-870  mg/dL   Near or Above                    Optimal  130-159  mg/dL   Borderline  839-810  mg/dL   High  >809     mg/dL   Very High Performed at [facility], [address], [place], [place] [address]    RPR Ser Ql 04/27/2024 NON  REACTIVE  NON REACTIVE Final   Performed at [facility], [address], [place], [place] [address]   TSH 04/27/2024 1.247  0.350 - 4.500 uIU/mL Final   Comment: Performed by a 3rd Generation assay with a functional sensitivity of <=0.01 uIU/mL. Performed at [facility], [address], [place], [place] [address]    HIV Screen 4th Generation wRfx 04/27/2024 Non Reactive  Non Reactive Final   Performed at [facility], [address], [place], [place] [address]  Admission on 04/24/2024, Discharged on 04/25/2024  Component Date Value Ref Range Status   Sodium 04/24/2024 140  135 - 145 mmol/L Final   Potassium 04/24/2024 3.7  3.5 - 5.1 mmol/L Final   Chloride 04/24/2024 102  98 - 111 mmol/L Final   CO2 04/24/2024 23  22 - 32 mmol/L Final   Glucose, Bld 04/24/2024 94  70 - 99 mg/dL Final   Glucose reference range applies only to samples taken after fasting for at least 8 hours.   BUN 04/24/2024 15  6 - 20 mg/dL Final   Creatinine, Ser 04/24/2024 0.97  0.61 - 1.24 mg/dL Final   Calcium  04/24/2024 9.2  8.9 - 10.3 mg/dL Final   Total Protein 93/95/7974 7.6  6.5 - 8.1 g/dL Final   Albumin 93/95/7974 3.7  3.5 - 5.0 g/dL Final   AST 93/95/7974 36  15 - 41 U/L Final   ALT 04/24/2024 44  0 - 44 U/L Final   Alkaline Phosphatase 04/24/2024 56  38 - 126 U/L Final   Total Bilirubin 04/24/2024 0.6  0.0 - 1.2 mg/dL Final   GFR, Estimated 04/24/2024 >60  >60 mL/min Final   Comment: (NOTE) Calculated using the CKD-EPI Creatinine Equation (2021)    Anion gap 04/24/2024 15  5 - 15 Final   Performed at [facility], [address], [place], [place] [address]   Alcohol, Ethyl (B) 04/24/2024 179 (H)  <15 mg/dL Final   Comment: (NOTE) For medical purposes only. Performed at [facility], [address], [place], [place] [address]    WBC 04/24/2024 7.2  4.0 - 10.5 K/uL Final   RBC 04/24/2024 4.80  4.22 - 5.81 MIL/uL Final   Hemoglobin 04/24/2024 13.4  13.0 -  17.0 g/dL Final   HCT 93/95/7974 41.8  39.0 - 52.0 % Final   MCV 04/24/2024 87.1  80.0 - 100.0 fL Final   MCH 04/24/2024 27.9  26.0 - 34.0 pg Final   MCHC 04/24/2024 32.1  30.0 - 36.0 g/dL Final   RDW 93/95/7974 14.6  11.5 - 15.5 % Final   Platelets 04/24/2024 370  150 - 400 K/uL Final   nRBC 04/24/2024 0.0  0.0 - 0.2 % Final   Performed at [facility], [address] [place], [place] [address]  Admission on 04/20/2024, Discharged on 04/21/2024  Component Date Value Ref Range Status   Sodium 04/20/2024 137  135 - 145 mmol/L Final   Potassium 04/20/2024 3.2 (L)  3.5 - 5.1 mmol/L Final   Chloride 04/20/2024 101  98 - 111 mmol/L Final   CO2 04/20/2024 23  22 - 32 mmol/L Final   Glucose, Bld 04/20/2024 100 (H)  70 - 99 mg/dL Final   Glucose reference range applies only to samples taken after fasting for at least 8 hours.   BUN 04/20/2024 14  6 - 20 mg/dL Final   Creatinine, Ser 04/20/2024 1.08  0.61 - 1.24 mg/dL Final   Calcium  04/20/2024 8.6 (L)  8.9 - 10.3 mg/dL Final   GFR, Estimated 04/20/2024 >60  >60 mL/min Final   Comment: (NOTE)  Calculated using the CKD-EPI Creatinine Equation (2021)    Anion gap 04/20/2024 13  5 - 15 Final   Performed at [facility], [address] [place], [place] [address]   WBC 04/20/2024 8.7  4.0 - 10.5 K/uL Final   RBC 04/20/2024 5.02  4.22 - 5.81 MIL/uL Final   Hemoglobin 04/20/2024 14.2  13.0 - 17.0 g/dL Final   HCT 94/68/7974 44.6  39.0 - 52.0 % Final   MCV 04/20/2024 88.8  80.0 - 100.0 fL Final   MCH 04/20/2024 28.3  26.0 - 34.0 pg Final   MCHC 04/20/2024 31.8  30.0 - 36.0 g/dL Final   RDW 94/68/7974 15.1  11.5 - 15.5 % Final   Platelets 04/20/2024 417 (H)  150 - 400 K/uL Final   nRBC 04/20/2024 0.0  0.0 - 0.2 % Final   Performed at [facility], [address], [place], [place] [address]   Opiates 04/20/2024 NONE DETECTED  NONE DETECTED Final   Cocaine 04/20/2024 NONE DETECTED  NONE DETECTED Final    Benzodiazepines 04/20/2024 NONE DETECTED  NONE DETECTED Final   Amphetamines 04/20/2024 NONE DETECTED  NONE DETECTED Final   Tetrahydrocannabinol 04/20/2024 NONE DETECTED  NONE DETECTED Final   Barbiturates 04/20/2024 NONE DETECTED  NONE DETECTED Final   Comment: (NOTE) DRUG SCREEN FOR MEDICAL PURPOSES ONLY.  IF CONFIRMATION IS NEEDED FOR ANY PURPOSE, NOTIFY LAB WITHIN 5 DAYS.  LOWEST DETECTABLE LIMITS FOR URINE DRUG SCREEN Drug Class                     Cutoff (ng/mL) Amphetamine and metabolites    1000 Barbiturate and metabolites    200 Benzodiazepine                 200 Opiates and metabolites        300 Cocaine and metabolites        300 THC                            50 Performed at [facility], [address], [place], [place] [address]    Alcohol, Ethyl (B) 04/20/2024 130 (H)  <15 mg/dL Final   Comment: (NOTE) For medical purposes only. Performed at [facility], [address], [place], [place] [address]     Blood Alcohol level:  Lab Results  Component Value Date   ETH 143 (H) 07/12/2024   ETH 106 (H) 07/10/2024    Metabolic Disorder Labs: Lab Results  Component Value Date   HGBA1C 5.6 04/27/2024   MPG 114 04/27/2024   MPG 122.63 10/10/2023   No results found for: PROLACTIN Lab Results  Component Value Date   CHOL 204 (H) 04/27/2024   TRIG 242 (H) 04/27/2024   HDL 34 (L) 04/27/2024   CHOLHDL 6.0 04/27/2024   VLDL 48 (H) 04/27/2024   LDLCALC 122 (H) 04/27/2024   LDLCALC 81 10/07/2023    Therapeutic Lab Levels: No results found for: LITHIUM No results found for: VALPROATE No results found for: CBMZ  Physical Findings   AIMS    Flowsheet Row [facility]) [facility]  AIMS Total Score 0 0   AUDIT    Flowsheet Row ED from 07/12/2024 in [facility] Admission (Discharged) [facility] Admission (Discharged[facility] Ad[facility] (Discharged) [facility]  Alcohol Use Disorder Identification Test Final Score (AUDIT) 17 20 30 27    PHQ2-9    Flowsheet Row ED from 07/12/2024 in [facility] ED from 10/07/2023 in [facility]  PHQ-2 Total Score 3 0  PHQ-9 Total Score 23 10   Flowsheet Row ED from 07/12/2024 in [facility] ED from 07/11/2024 in [facility] ED from 07/10/2024 in [facility]  C-SSRS RISK CATEGORY High Risk High Risk High Risk     Musculoskeletal  Strength & Muscle Tone: within normal limits Gait & Station: normal Patient leans: N/A  Psychiatric Specialty Exam  Presentation  General Appearance:  Casual  Eye Contact: None  Speech: Slurred; Slow  Speech Volume: Decreased  Handedness: Right   Mood and Affect  Mood: Depressed  Affect: Congruent   Thought Process  Thought Processes: Coherent  Descriptions of Associations:Intact  Orientation:Full (Time, Place and Person)  Thought Content:WDL  Diagnosis of Schizophrenia or Schizoaffective disorder in past: No  Duration of Psychotic Symptoms: Less than six months   Hallucinations:Hallucinations: None  Ideas of Reference:None  Suicidal Thoughts:Suicidal Thoughts: Yes, Active SI Active Intent and/or Plan: With Plan  Homicidal Thoughts:Homicidal Thoughts: No   Sensorium  Memory: Immediate Fair  Judgment: Poor  Insight: Lacking   Executive Functions  Concentration: Poor  Attention Span: Poor  Recall: Poor  Fund of Knowledge: Poor  Language: Poor   Psychomotor Activity  Psychomotor  Activity: Psychomotor Activity: Normal   Assets  Assets: Communication Skills; Desire for Improvement   Sleep  Sleep: Sleep: Fair  Estimated Sleeping Duration (Last 24 Hours): 0.00 hours  Nutritional Assessment (For OBS and [facility]BC admissions only) Has the patient had a weight loss or gain of 10 pounds or more in the last 3 months?: No Has the patient had a decrease in food intake/or appetite?: No Does the patient have dental problems?: No Does the patient have eating habits or behaviors that may be indicators of an eating disorder including binging or inducing vomiting?: No Has the patient recently lost weight without trying?: 0 Has the patient been eating poorly because of a decreased appetite?: 0 Malnutrition Screening Tool Score: 0    Physical Exam  Physical exam: Please see exam on admit note. General: Well developed, well nourished.  Pupils: Normal at 3mm Respiratory: Breathing is unlabored.  Cardiovascular: No edema.  Language: No anomia, no aphasia Muscle strength and tone-pt moving all extremities.  Gait not assessed as pt remained in bed.  Neuro: Facial muscles are symmetric. Pt without tremor, no evidence of hyperarousal.  Review of Systems  Constitutional: Negative.   HENT: Negative.    Eyes: Negative.   Respiratory: Negative.    Cardiovascular: Negative.   Gastrointestinal: Negative.   Genitourinary: Negative.   Musculoskeletal: Negative.   Skin: Negative.   Neurological: Negative.   Endo/Heme/Allergies: Negative.   Psychiatric/Behavioral:  Positive for depression, substance abuse and suicidal ideas. The patient is nervous/anxious and has insomnia.    Blood pressure 94/60, pulse 79, temperature 98.7 F (37.1 C), temperature source Oral, resp. rate 16, SpO2 98%. There is no height or weight on file to calculate BMI.  Treatment Plan Summary: ASSESSMENT: Principal Problem:   MDD (major depressive disorder), recurrent, severe, with psychosis (HCC) Active  Problems:   Alcohol dependence (  [facility])   Patient presenting with worsening depression and suicidal ideation. Previous suicidal gestures of holding a gun to his head. Reports thoughts of shooting himself the past week and reports SI with plan to shoot himself today but would like to get into inpatient rehab. Detoxing from alcohol on librium  taper currently. Denies access to gun.  Patient reports he started drinking since April due to multiple psychosocial stressors. Patient has recently been experiencing  AH of derogatory voices since April as well as recurring this past week. Previously responded well to Abilify  and is interested in restarting. Patient at high acute risk for suicide.    Treatment Plan Summary: Daily contact with patient to assess and evaluate symptoms and progress in treatment, Medication management, as below   Observation Level/Precautions:  15 minute checks  Laboratory:  CBC Chemistry Profile Folic Acid  HbAIC UDS UA Vitamin B-12  Psychotherapy:    Medications:  Zoloft , hydroxyzine , trazodone   Consultations:    Discharge Concerns:  rehab referrals  Estimated LOS:5-7 days  Other:      Safety and Monitoring: voluntarily admission to inpatient psychiatric unit for safety, stabilization and treatment Daily contact with patient to assess and evaluate symptoms and progress in treatment Patient's case to be discussed in multi-disciplinary team meeting Observation Level : q15 minute checks Vital signs: q12 hours Precautions: suicide, elopement, and assault   2. Psychiatric Problems #MDD, severe recurrent with psychotic features increase sertraline  to 50 mg qdaily -start Abilify  5 mg qdaily for mood stabilization, agitation and reported auditory hallucinations  -requesting to restart trazodone  50 mg at bedtime for insomnia   #alcohol use disorder severe without withdrawal - continue Librium  taper - maintain on CIWA protocol + PRN Librium  -will start Naltrexone  50 mg  qdaily if above medications are well tolerated     3. Medical Management Covid negative CMP: wnl CBC: unremarkable EtOH: <10 UDS: wnl TSH: ordered AM draw A1C: ordered AM draw Lipids: ordered AM draw     #Dm2 -restart metformin  500 mg qdaily   #HLD,  Restart simvastatin  10 mg qdaily     Physician Treatment Plan for Primary Diagnosis: MDD (major depressive disorder), recurrent, severe, with psychosis (HCC) Long Term Goal(s): Improvement in symptoms so as ready for discharge   Short Term Goals: Ability to identify changes in lifestyle to reduce recurrence of condition will improve, Ability to verbalize feelings will improve, Ability to disclose and discuss suicidal ideas, Ability to demonstrate self-control will improve, Ability to identify and develop effective coping behaviors will improve, Ability to maintain clinical measurements within normal limits will improve, Compliance with prescribed medications will improve, and Ability to identify triggers associated with substance abuse/mental health issues will improve   Physician Treatment Plan for Secondary Diagnosis: Principal Problem:   MDD (major depressive disorder), recurrent, severe, with psychosis (HCC) Active Problems:   Alcohol dependence (HCC)     Long Term Goal(s): Improvement in symptoms so as ready for discharge   Short Term Goals: Ability to identify changes in lifestyle to reduce recurrence of condition will improve, Ability to verbalize feelings will improve, Ability to disclose and discuss suicidal ideas, Ability to demonstrate self-control will improve, Ability to identify and develop effective coping behaviors will improve, Ability to maintain clinical measurements within normal limits will improve, Compliance with prescribed medications will improve, and Ability to identify triggers associated with substance abuse/mental health issues will improve   I certify that inpatient services furnished can reasonably be  expected to improve the patient's condition.  Latha Staunton, MD 07/12/2024 12:07 PM

## 2024-07-12 NOTE — ED Provider Notes (Addendum)
 Facility Based Crisis Admission H&P  Date: 07/12/24 Patient Name: Aaron [name] MRN: [id number] Chief Complaint: I just wanna end my life.   Diagnoses:  Final diagnoses:  Alcohol abuse with intoxication, uncomplicated (HCC)  Suicidal ideations  Homelessness  Alcohol-induced mood disorder with depressive symptoms (HCC)    HPI: Aaron [name] is a 48 year old male with psychiatric history of MDD with psychosis, Anxiety, malingering, alcohol use disorder, severe dependence, suicidal ideations, who presented voluntarily as a walk-in to [facility] with complaints of worsening depression, SI, with a plan to shoot himself and HI without plan/intent. He identifies job loss and lack of support systems as his major stressors.  Patient was seen face to face by this provider and chart reviewed. Per chart review, was presented to [facility] yesterday with similar complaints, but requested to be discharged after he reported a friend would pick him up to go drinking and stay at his place.   Tonight, patient reports  I have no friends, I'm mad at my family, I have some crazy thoughts, I'm fighting against God, I love God, but the devil is like no.   He continues I'm having suicidal thoughts,  I'm having thoughts of killing myself, I've been drinking a lot of liquor-9 shots of gin, vodka,  been drinking the last 3 days, I last drank about an hour ago, and I've been thinking about using drugs a lot.  Patient continues to endorse SI and states I've been having suicidal thoughts for the last couple of months, anytime I see a car, I want to jump in front of it or shoot myself, I just wanna end my life.    Patient reports he is homeless but lives off/on with his cousin.  He is not established with an outpatient psychiatrist or therapist, and currently not taking any medications.   He reports last taking his medications a couple of days ago.  Patient reports he was locked up for 30 days and was  released from jail on Monday.  He reports getting his medications from jail.  Patient completed the PHQ-9 questionnaire and obtained a total score of 23, indicating severe depression.  On evaluation, patient appears intoxicated, oriented at baseline, and cooperative. Speech is slow and slurred. Pt appears casually dressed. He does not make eye contact. Mood is anxious and depressed, affect is congruent with mood. Thought process is goal directed and thought content is WDL. Pt endorses SI with plan, denies HI/AVH. There is no objective indication that the patient is responding to internal stimuli. No delusions elicited during this assessment.    Discussed recommendation for inpatient admission to the [facility] for mental health and substance abuse treatment.  Patient is provided with opportunity for questions.  He verbalized understanding and is in agreement.  PHQ 2-9:  Flowsheet Row ED from 07/12/2024 in [facility] ED from 10/07/2023 in [facility]  Thoughts that you would be better off dead, or of hurting yourself in some way Nearly every day Several days  PHQ-9 Total Score 23 10    Flowsheet Row ED from 07/12/2024 in [facility] ED from 07/11/2024 in [facility] ED from 07/10/2024 in [facility] Emergency Department at [facility]  C-SSRS RISK CATEGORY High Risk High Risk High Risk    Screenings    Flowsheet Row Most Recent Value  CIWA-Ar Total 5    Total Time spent with patient: 30 minutes  Musculoskeletal  Strength & Muscle Tone: within normal limits Gait & Station: normal Patient leans: N/A  Psychiatric Specialty Exam  Presentation General Appearance:  Casual  Eye Contact: None  Speech: Slurred; Slow  Speech Volume: Decreased  Handedness: Right   Mood and Affect  Mood: Depressed  Affect: Congruent   Thought Process  Thought  Processes: Coherent  Descriptions of Associations:Intact  Orientation:Full (Time, Place and Person)  Thought Content:WDL  Diagnosis of Schizophrenia or Schizoaffective disorder in past: No  Duration of Psychotic Symptoms: Less than six months  Hallucinations:Hallucinations: None  Ideas of Reference:None  Suicidal Thoughts:Suicidal Thoughts: Yes, Active SI Active Intent and/or Plan: With Plan  Homicidal Thoughts:Homicidal Thoughts: No   Sensorium  Memory: Immediate Fair  Judgment: Poor  Insight: Lacking   Executive Functions  Concentration: Poor  Attention Span: Poor  Recall: Poor  Fund of Knowledge: Poor  Language: Poor   Psychomotor Activity  Psychomotor Activity: Psychomotor Activity: Normal   Assets  Assets: Communication Skills; Desire for Improvement   Sleep  Sleep: Sleep: Fair   Nutritional Assessment (For OBS and FBC admissions only) Has the patient had a weight loss or gain of 10 pounds or more in the last 3 months?: No Has the patient had a decrease in food intake/or appetite?: No Does the patient have dental problems?: No Does the patient have eating habits or behaviors that may be indicators of an eating disorder including binging or inducing vomiting?: No Has the patient recently lost weight without trying?: 0 Has the patient been eating poorly because of a decreased appetite?: 0 Malnutrition Screening Tool Score: 0    Physical Exam Constitutional:      General: He is not in acute distress.    Appearance: He is not diaphoretic.  HENT:     Nose: No congestion.  Pulmonary:     Effort: No respiratory distress.  Chest:     Chest wall: No tenderness.  Neurological:     Mental Status: He is alert. Mental status is at baseline.  Psychiatric:        Attention and Perception: He is inattentive.        Mood and Affect: Mood is anxious and depressed.        Speech: Speech is slurred.        Behavior: Behavior is cooperative.         Thought Content: Thought content includes suicidal ideation. Thought content includes suicidal plan.    Review of Systems  Constitutional:  Negative for chills, diaphoresis and fever.  HENT:  Negative for congestion.   Eyes:  Negative for discharge.  Respiratory:  Negative for cough, shortness of breath and wheezing.   Cardiovascular:  Negative for chest pain and palpitations.  Gastrointestinal:  Negative for diarrhea, nausea and vomiting.  Neurological:  Negative for dizziness, seizures, weakness and headaches.  Psychiatric/Behavioral:  Positive for depression, substance abuse and suicidal ideas. The patient is nervous/anxious.      Past Psychiatric History: See H & P   Is the patient at risk to self? Yes  Has the patient been a risk to self in the past 6 months? Yes .    Has the patient been a risk to self within the distant past? Yes   Is the patient a risk to others? No   Has the patient been a risk to others in the past 6 months? No   Has the patient been a risk to others within the distant past? No   Past  Medical History: See Chart Family History: N/A Social History: N/A  Last Labs:  Admission on 07/12/2024  Component Date Value Ref Range Status   Alcohol, Ethyl (B) 07/12/2024 143 (H)  <15 mg/dL Final   Comment: (NOTE) For medical purposes only. Performed at [facility], [address] [place], [place] [address]    POC Amphetamine UR 07/12/2024 None Detected  NONE DETECTED (Cut Off Level 1000 ng/mL) Final   POC Secobarbital (BAR) 07/12/2024 None Detected  NONE DETECTED (Cut Off Level 300 ng/mL) Final   POC Buprenorphine (BUP) 07/12/2024 None Detected  NONE DETECTED (Cut Off Level 10 ng/mL) Final   POC Oxazepam (BZO) 07/12/2024 None Detected  NONE DETECTED (Cut Off Level 300 ng/mL) Final   POC Cocaine UR 07/12/2024 None Detected  NONE DETECTED (Cut Off Level 300 ng/mL) Final   POC Methamphetamine UR 07/12/2024 None Detected  NONE DETECTED (Cut Off Level  1000 ng/mL) Final   POC Morphine 07/12/2024 None Detected  NONE DETECTED (Cut Off Level 300 ng/mL) Final   POC Methadone UR 07/12/2024 None Detected  NONE DETECTED (Cut Off Level 300 ng/mL) Final   POC Oxycodone UR 07/12/2024 None Detected  NONE DETECTED (Cut Off Level 100 ng/mL) Final   POC Marijuana UR 07/12/2024 None Detected  NONE DETECTED (Cut Off Level 50 ng/mL) Final  Admission on 07/10/2024, Discharged on 07/11/2024  Component Date Value Ref Range Status   Sodium 07/10/2024 140  135 - 145 mmol/L Final   Potassium 07/10/2024 3.6  3.5 - 5.1 mmol/L Final   Chloride 07/10/2024 105  98 - 111 mmol/L Final   CO2 07/10/2024 20 (L)  22 - 32 mmol/L Final   Glucose, Bld 07/10/2024 98  70 - 99 mg/dL Final   Glucose reference range applies only to samples taken after fasting for at least 8 hours.   BUN 07/10/2024 9  6 - 20 mg/dL Final   Creatinine, Ser 07/10/2024 0.63  0.61 - 1.24 mg/dL Final   Calcium  07/10/2024 8.9  8.9 - 10.3 mg/dL Final   Total Protein 91/79/7974 7.3  6.5 - 8.1 g/dL Final   Albumin 91/79/7974 3.7  3.5 - 5.0 g/dL Final   AST 91/79/7974 28  15 - 41 U/L Final   ALT 07/10/2024 49 (H)  0 - 44 U/L Final   Alkaline Phosphatase 07/10/2024 65  38 - 126 U/L Final   Total Bilirubin 07/10/2024 0.6  0.0 - 1.2 mg/dL Final   GFR, Estimated 07/10/2024 >60  >60 mL/min Final   Comment: (NOTE) Calculated using the CKD-EPI Creatinine Equation (2021)    Anion gap 07/10/2024 15  5 - 15 Final   Performed at [facility], [address], [place], [place] [address]   Alcohol, Ethyl (B) 07/10/2024 106 (H)  <15 mg/dL Final   Comment: (NOTE) For medical purposes only. Performed at [facility], [address] [place], [place] [address]    WBC 07/10/2024 6.4  4.0 - 10.5 K/uL Final   RBC 07/10/2024 4.85  4.22 - 5.81 MIL/uL Final   Hemoglobin 07/10/2024 14.1  13.0 - 17.0 g/dL Final   HCT 91/79/7974 43.2  39.0 - 52.0 % Final   MCV 07/10/2024 89.1  80.0 - 100.0 fL Final   MCH  07/10/2024 29.1  26.0 - 34.0 pg Final   MCHC 07/10/2024 32.6  30.0 - 36.0 g/dL Final   RDW 91/79/7974 14.0  11.5 - 15.5 % Final   Platelets 07/10/2024 368  150 - 400  K/uL Final   nRBC 07/10/2024 0.0  0.0 - 0.2 % Final   Performed at [facility], [address] [place], [place] [address]   Opiates 07/10/2024 NONE DETECTED  NONE DETECTED Final   Cocaine 07/10/2024 NONE DETECTED  NONE DETECTED Final   Benzodiazepines 07/10/2024 NONE DETECTED  NONE DETECTED Final   Amphetamines 07/10/2024 NONE DETECTED  NONE DETECTED Final   Tetrahydrocannabinol 07/10/2024 NONE DETECTED  NONE DETECTED Final   Barbiturates 07/10/2024 NONE DETECTED  NONE DETECTED Final   Comment: (NOTE) DRUG SCREEN FOR MEDICAL PURPOSES ONLY.  IF CONFIRMATION IS NEEDED FOR ANY PURPOSE, NOTIFY LAB WITHIN 5 DAYS.  LOWEST DETECTABLE LIMITS FOR URINE DRUG SCREEN Drug Class                     Cutoff (ng/mL) Amphetamine and metabolites    1000 Barbiturate and metabolites    200 Benzodiazepine                 200 Opiates and metabolites        300 Cocaine and metabolites        300 THC                            50 Performed at [facility], [address] [place], [place] [address]   Admission on 04/25/2024, Discharged on 05/01/2024  Component Date Value Ref Range Status   Hgb A1c MFr Bld 04/27/2024 5.6  4.8 - 5.6 % Final   Comment: (NOTE)         Prediabetes: 5.7 - 6.4         Diabetes: >6.4         Glycemic control for adults with diabetes: <7.0    Mean Plasma Glucose 04/27/2024 114  mg/dL Final   Comment: (NOTE) Performed At: [facility] [address] [place], [place] [id number] Jennette Shorter MD Ey:[id number]    Cholesterol 04/27/2024 204 (H)  0 - 200 mg/dL Final   Triglycerides 93/92/7974 242 (H)  <150 mg/dL Final   HDL 93/92/7974 34 (L)  >40 mg/dL Final   Total CHOL/HDL Ratio 04/27/2024 6.0  RATIO Final   VLDL 04/27/2024 48 (H)  0 - 40 mg/dL Final   LDL Cholesterol 04/27/2024 122  (H)  0 - 99 mg/dL Final   Comment:        Total Cholesterol/HDL:CHD Risk Coronary Heart Disease Risk Table                     Men   Women  1/2 Average Risk   3.4   3.3  Average Risk       5.0   4.4  2 X Average Risk   9.6   7.1  3 X Average Risk  23.4   11.0        Use the calculated Patient Ratio above and the CHD Risk Table to determine the patient's CHD Risk.        ATP [name] CLASSIFICATION (LDL):  <100     mg/dL   Optimal  899-870  mg/dL   Near or Above                    Optimal  130-159  mg/dL   Borderline  839-810  mg/dL   High  >809     mg/dL   Very High Performed at [facility], [address] [place], [place] [address]    RPR Ser Ql 04/27/2024 NON REACTIVE  NON REACTIVE Final   Performed at [facility], [address] [place], [place] [address]   TSH 04/27/2024 1.247  0.350 - 4.500 uIU/mL Final   Comment: Performed by a 3rd Generation assay with a functional sensitivity of <=0.01 uIU/mL. Performed at [facility], [address] [place], [place] [address]    HIV Screen 4th Generation wRfx 04/27/2024 Non Reactive  Non Reactive Final   Performed at [facility], [address] [place], [place] [address]  Admission on 04/24/2024, Discharged on 04/25/2024  Component Date Value Ref Range Status   Sodium 04/24/2024 140  135 - 145 mmol/L Final   Potassium 04/24/2024 3.7  3.5 - 5.1 mmol/L Final   Chloride 04/24/2024 102  98 - 111 mmol/L Final   CO2 04/24/2024 23  22 - 32 mmol/L Final   Glucose, Bld 04/24/2024 94  70 - 99 mg/dL Final   Glucose reference range applies only to samples taken after fasting for at least 8 hours.   BUN 04/24/2024 15  6 - 20 mg/dL Final   Creatinine, Ser 04/24/2024 0.97  0.61 - 1.24 mg/dL Final   Calcium  04/24/2024 9.2  8.9 - 10.3 mg/dL Final   Total Protein 93/95/7974 7.6  6.5 - 8.1 g/dL Final   Albumin 93/95/7974 3.7  3.5 - 5.0 g/dL Final   AST 93/95/7974 36  15 - 41 U/L Final   ALT  04/24/2024 44  0 - 44 U/L Final   Alkaline Phosphatase 04/24/2024 56  38 - 126 U/L Final   Total Bilirubin 04/24/2024 0.6  0.0 - 1.2 mg/dL Final   GFR, Estimated 04/24/2024 >60  >60 mL/min Final   Comment: (NOTE) Calculated using the CKD-EPI Creatinine Equation (2021)    Anion gap 04/24/2024 15  5 - 15 Final   Performed at [facility], [address], [place], [place] [address]   Alcohol, Ethyl (B) 04/24/2024 179 (H)  <15 mg/dL Final   Comment: (NOTE) For medical purposes only. Performed at [facility], [address] [place], [place] [address]    WBC 04/24/2024 7.2  4.0 - 10.5 K/uL Final   RBC 04/24/2024 4.80  4.22 - 5.81 MIL/uL Final   Hemoglobin 04/24/2024 13.4  13.0 - 17.0 g/dL Final   HCT 93/95/7974 41.8  39.0 - 52.0 % Final   MCV 04/24/2024 87.1  80.0 - 100.0 fL Final   MCH 04/24/2024 27.9  26.0 - 34.0 pg Final   MCHC 04/24/2024 32.1  30.0 - 36.0 g/dL Final   RDW 93/95/7974 14.6  11.5 - 15.5 % Final   Platelets 04/24/2024 370  150 - 400 K/uL Final   nRBC 04/24/2024 0.0  0.0 - 0.2 % Final   Performed at [facility], [address], [place], [place] [address]  Admission on 04/20/2024, Discharged on 04/21/2024  Component Date Value Ref Range Status   Sodium 04/20/2024 137  135 - 145 mmol/L Final   Potassium 04/20/2024 3.2 (L)  3.5 - 5.1 mmol/L Final   Chloride 04/20/2024 101  98 - 111 mmol/L Final   CO2 04/20/2024 23  22 - 32 mmol/L Final   Glucose, Bld 04/20/2024 100 (H)  70 - 99 mg/dL Final   Glucose reference range applies only to samples taken after fasting for at least 8 hours.   BUN 04/20/2024 14  6 - 20  mg/dL Final   Creatinine, Ser 04/20/2024 1.08  0.61 - 1.24 mg/dL Final   Calcium  04/20/2024 8.6 (L)  8.9 - 10.3 mg/dL Final   GFR, Estimated 04/20/2024 >60  >60 mL/min Final   Comment: (NOTE) Calculated using the CKD-EPI Creatinine Equation (2021)    Anion gap 04/20/2024 13  5 - 15 Final   Performed at [facility], [address], [place], [place] [address]   WBC 04/20/2024 8.7  4.0 - 10.5 K/uL Final   RBC 04/20/2024 5.02  4.22 - 5.81 MIL/uL Final   Hemoglobin 04/20/2024 14.2  13.0 - 17.0 g/dL Final   HCT 94/68/7974 44.6  39.0 - 52.0 % Final   MCV 04/20/2024 88.8  80.0 - 100.0 fL Final   MCH 04/20/2024 28.3  26.0 - 34.0 pg Final   MCHC 04/20/2024 31.8  30.0 - 36.0 g/dL Final   RDW 94/68/7974 15.1  11.5 - 15.5 % Final   Platelets 04/20/2024 417 (H)  150 - 400 K/uL Final   nRBC 04/20/2024 0.0  0.0 - 0.2 % Final   Performed at [facility], [address], [place], [place] [address]   Opiates 04/20/2024 NONE DETECTED  NONE DETECTED Final   Cocaine 04/20/2024 NONE DETECTED  NONE DETECTED Final   Benzodiazepines 04/20/2024 NONE DETECTED  NONE DETECTED Final   Amphetamines 04/20/2024 NONE DETECTED  NONE DETECTED Final   Tetrahydrocannabinol 04/20/2024 NONE DETECTED  NONE DETECTED Final   Barbiturates 04/20/2024 NONE DETECTED  NONE DETECTED Final   Comment: (NOTE) DRUG SCREEN FOR MEDICAL PURPOSES ONLY.  IF CONFIRMATION IS NEEDED FOR ANY PURPOSE, NOTIFY LAB WITHIN 5 DAYS.  LOWEST DETECTABLE LIMITS FOR URINE DRUG SCREEN Drug Class                     Cutoff (ng/mL) Amphetamine and metabolites    1000 Barbiturate and metabolites    200 Benzodiazepine                 200 Opiates and metabolites        300 Cocaine and metabolites        300 THC                            50 Performed at [facility], [address], [place], [place] [address]    Alcohol, Ethyl (B) 04/20/2024 130 (H)  <15 mg/dL Final   Comment: (NOTE) For medical purposes only. Performed at [facility], [address] [place], [place] [address]     Allergies: Patient has no known allergies.  Medications:  Facility Ordered Medications  Medication   acetaminophen  (TYLENOL ) tablet 650 mg   alum & mag hydroxide-simeth (MAALOX/MYLANTA) 200-200-20 MG/5ML suspension 30 mL    magnesium  hydroxide (MILK OF MAGNESIA) suspension 30 mL   multivitamin with minerals tablet 1 tablet   chlordiazePOXIDE  (LIBRIUM ) capsule 25 mg   hydrOXYzine  (ATARAX ) tablet 25 mg   loperamide  (IMODIUM ) capsule 2-4 mg   ondansetron  (ZOFRAN -ODT) disintegrating tablet 4 mg   haloperidol  (HALDOL ) tablet 5 mg   And   diphenhydrAMINE  (BENADRYL ) capsule 50 mg   haloperidol  lactate (HALDOL ) injection 5 mg   And   diphenhydrAMINE  (BENADRYL ) injection 50 mg   And   LORazepam  (ATIVAN ) injection 2 mg   haloperidol  lactate (HALDOL ) injection 10 mg   And   diphenhydrAMINE  (BENADRYL ) injection 50 mg   And   LORazepam  (  ATIVAN ) injection 2 mg   sertraline  (ZOLOFT ) tablet 25 mg   chlordiazePOXIDE  (LIBRIUM ) capsule 25 mg   Followed by   NOREEN ON 07/13/2024] chlordiazePOXIDE  (LIBRIUM ) capsule 25 mg   Followed by   NOREEN ON 07/14/2024] chlordiazePOXIDE  (LIBRIUM ) capsule 25 mg   Followed by   NOREEN ON 07/15/2024] chlordiazePOXIDE  (LIBRIUM ) capsule 25 mg   PTA Medications  Medication Sig   simvastatin  (ZOCOR ) 10 MG tablet Take 1 tablet (10 mg total) by mouth at bedtime. (Patient not taking: Reported on 07/11/2024)   ARIPiprazole  (ABILIFY ) 15 MG tablet Take 1 tablet (15 mg total) by mouth daily. (Patient not taking: Reported on 07/11/2024)   sertraline  (ZOLOFT ) 100 MG tablet Take 1 tablet (100 mg total) by mouth at bedtime. (Patient not taking: Reported on 07/11/2024)   traZODone  (DESYREL ) 100 MG tablet Take 2 tablets (200 mg total) by mouth at bedtime as needed for sleep (insomnia). (Patient not taking: Reported on 07/11/2024)   metFORMIN  (GLUCOPHAGE -XR) 500 MG 24 hr tablet Take 1 tablet (500 mg total) by mouth daily with breakfast. (Patient not taking: Reported on 07/11/2024)   naltrexone  (DEPADE) 50 MG tablet Take 1 tablet (50 mg total) by mouth daily. (Patient not taking: Reported on 07/11/2024)   doxycycline  (VIBRAMYCIN ) 100 MG capsule Take 1 capsule (100 mg total) by mouth 2 (two) times daily. (Patient  not taking: Reported on 07/11/2024)   acetaminophen  (TYLENOL ) 325 MG tablet Take 650 mg by mouth every 6 (six) hours as needed for mild pain (pain score 1-3) or headache.    Long Term Goals: Improvement in symptoms so as ready for discharge  Short Term Goals: Patient will verbalize feelings in meetings with treatment team members., Patient will attend at least of 50% of the groups daily., Pt will complete the PHQ9 on admission, day 3 and discharge., Patient will participate in completing the [place] Suicide Severity Rating Scale, Patient will score a low risk of violence for 24 hours prior to discharge, and Patient will take medications as prescribed daily.  Medical Decision Making  Recommend admission to the [facility] for mental health and substance abuse treatment/alcohol detox.   Recommend CIWA protocol-AUD   Lab Orders         Ethanol         Comprehensive metabolic panel         CBC with Differential         POCT Urine Drug Screen - (I-Screen)       I have reviewed and Labs, EKG and medication administered at [facility].    Home medications continued -Zoloft  25 mg Po daily fro depressive and anxiety symptoms  Prn meds -Tylenol . Maalox, MOM,  -agitation Protocol medications  Recommendations  Based on my evaluation the patient does not appear to have an emergency medical condition.  Recommend admission to the [facility] for mental health and substance abuse treatment/alcohol detox.  Recommend CIWA protocol.   Thurman LULLA Ivans, NP 07/12/24  2:54 AM

## 2024-07-12 NOTE — [facility] Group Notes (Addendum)
 SPIRITUALITY GROUP NOTE  Spirituality group facilitated by Chaplain Jerre Vandrunen, MDiv, BCC.  Group Description: Group focused on topic of hope. Patients participated in facilitated discussion around topic, connecting with one another around experiences and definitions for hope. Group members engaged with visual explorer photos, reflecting on what hope looks like for them today. Group engaged in discussion around how their definitions of hope are present today in hospital.  Modalities: Psycho-social ed, Adlerian, Narrative, MI  Patient Progress: Present throughout group.  Actively engaged in conversation with insight into addiction process and relationships.

## 2024-07-12 NOTE — ED Notes (Signed)
 Patient remains dysphoric and isolative to room and bed.  Patient states he does not have thoughts of wanting to harm himself but is just feeling down.  Patient given support.  Patient CIWA = 4 and he is tolerating the librium  taper.  Will monitor.

## 2024-07-12 NOTE — ED Notes (Signed)
 D: Pt here voluntarily from home. Pt denies HI/AVH at this time. Pt was experiencing suicidal ideations with a plan to shoot himself. Pt still endorses suicidal ideations but contracts for safety in facility. Rates pain 8/10 as headache. Pt states he has been feeling this way since May 2025. Pt lost his job and is now homeless and sometimes staying with relatives. Pt laments that his life has turned out this way. I neve thought I would be like this. Pt says he has had a long history with alcohol use. Pt states that the last week he has been drinking heavily with his last drink Thursday night. Pt drank 8 shots of liquor. Pt endorses tobacco use but not any drug use other than alcohol. Pt rates depression 9/10 and anxiety 9/10. Pt states he has a complicated relationship with his mother. Does not identify anyone as a social support.   Pt has previously been prescribed medications but has not taken them in a long time. Denies history of abuse. Denies current or past self-harm behavior. Endorses prior suicide attempts where he held a gun to his head. States he doesn't know why he didn't pull the trigger.   A: Pt was offered support and encouragement. Pt is cooperative during assessment. VS assessed and admission paperwork signed. Belongings searched and contraband items placed in locker. Non-invasive skin search completed: tattoos on right arm; dry flaky skin on both feet. Pt offered food and drink and both accepted. Pt introduced to unit milieu by nursing staff. Q 15 minute checks were started for safety.   R: Pt in room 155. Pt safety maintained on unit.

## 2024-07-12 NOTE — Group Note (Signed)
 Group Topic: Recovery Basics  Group Date: 07/12/2024 Start Time: 1200 End Time: 1230 Facilitators: Stanly Stabile, RN  Department: [facility]  Number of Participants: 7  Group Focus: chemical dependency education Treatment Modality:  Behavior Modification Therapy Interventions utilized were clarification and patient education Purpose: explore maladaptive thinking, express feelings, express irrational fears, improve communication skills, increase insight, regain self-worth, and reinforce self-care  Name: Kartik Fernando III Date of Birth: June 05, 1976  MR: [id number]    Level of Participation: moderate Quality of Participation: attentive and cooperative Interactions with others: gave feedback Mood/Affect: appropriate Triggers (if applicable):   Cognition: coherent/clear and goal directed Progress: Gaining insight Response:   Plan: follow-up needed  Patients Problems:  Patient Active Problem List   Diagnosis Date Noted   Alcohol abuse with intoxication, uncomplicated (HCC) 07/12/2024   Malingering 07/11/2024   MDD (major depressive disorder), recurrent severe, without psychosis (HCC) 04/25/2024   Suicidal ideation    Alcohol withdrawal without perceptual disturbances (HCC) 04/08/2021   Alcohol use disorder, severe, dependence (HCC) 04/08/2021   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

## 2024-07-12 NOTE — ED Notes (Signed)
 Pt asleep at this hour. No apparent distress. RR even and unlabored. Monitored for safety.

## 2024-07-12 NOTE — Discharge Planning (Addendum)
 LCSW met with patient to assess current mood, affect, physical state, and inquire about needs/goals while here in [facility] and after discharge. Patient reports he presented to [facility] due to suicidal ideations. Per EMR, patient had event recently when brother was incarcerated and patient refused to leave the jail and was IVC'd by the jail SW.   Patient reports he has been living with his cousin and can return back there again but is otherwise homeless. Patient denies having access to transportation, and reports having limited social support but has support of one cousin.  Patient has dx: schizophrenia, autism, and substance use. Patient reported drinking 8 shots of liquor daily. He stated he has not been taking his medications for months and does not have an outpatient mental health provider or a PCP and got his medications through the ED. Patient has Medicaid of [employer].   Patient reports his current goal is to seek residential placement for substance use. SW made him aware of the limitations and barriers to getting accepted into a residential treatment with mental health concerns and SI. He voiced understanding that SW would send out referrals also. SW discussed IOP and PHP with patient and he stated if he returns back out to the community he will drink again.   Patient denies any prior history of outpatient or inpatient substance abuse treatment. Patient currently denies any SI/HI/AVH. Patient aware that LCSW will send referrals out for review and will follow up to provide updates as received. Patient expressed understanding and appreciation of LCSW assistance. No other needs were reported at this time by patient.

## 2024-07-13 NOTE — ED Provider Notes (Signed)
 Behavioral Health Progress Note  Date and Time: 07/13/2024 3:17 PM Name: Aaron [name] MRN:  [id number]  ID: 48 year old male with psychiatric history of MDD with psychosis, Anxiety, malingering, alcohol use disorder, severe dependence, suicidal ideations, who presented voluntarily as a walk-in to [facility] with complaints of worsening depression, SI, with a plan to shoot himself and HI without plan/intent.   Subjective:  Patient well known to me from prior admission  at [facility] in 04/2024. He did well on combination of Zoloft  and Abilify  and is tolerating restarting this medications. Denies any AVH or paranoia today.Tolerating Librium  taper well with CIWA of 0 today. Patient reports SI without a plan and depressed mood, hopelessness and anhedonia. Reports improving sleep and appetite. Denies AVH. Had suicidal gestures of holding a gun to his head in the past but no longer has access to gun  Diagnosis:  Final diagnoses:  Alcohol abuse with intoxication, uncomplicated (HCC)  Suicidal ideations  Homelessness  Alcohol-induced mood disorder with depressive symptoms (HCC)  MDD (major depressive disorder), recurrent, severe, with psychosis (HCC)    Total Time spent with patient: 30 minutes  Past Psychiatric History:  Previous Psych Diagnoses:  alcohol use disorder, depression Prior inpatient treatment: reports 6 admissions throughout his life, was admitted to [facility] last year fo Current/prior outpatient treatment: poor outpatient followup Prior rehab hx: denies Psychotherapy hx: denies History of suicide: denies but had suicidal gestures of holding a gun to his head in the past but no longer has access to gun History of homicide: denies Psychiatric medication history: Zoloft , Abilify , naltrexone  Psychiatric medication compliance history: poor  Substance Abuse Hx: Alcohol: patient with 4 DUIS, states he has had blackouts previously, patient states for the past week he had been drinking 8  shots a day, multiple drinks at the bar Tobacco: reports 2-3 cigarettes a day, declines patch  Illicit drugs: denies Rx drug abuse:  denies Rehab hx:  denies   Past Medical History: Medical Diagnoses: DM2, HLD Head trauma, LOC, concussions, seizures: denies Allergies: NKDA     Family History: Psych: denies Psych Rx: denies SA/HA: denies  Social History: Patient has a daughter in [place], [place] . Patient's mother is in [place]  Additional Social History:                         Sleep: Poor  Appetite:  Poor  Current Medications:  Current Facility-Administered Medications  Medication Dose Route Frequency Provider Last Rate Last Admin   acetaminophen  (TYLENOL ) tablet 650 mg  650 mg Oral Q6H PRN Onuoha, Chinwendu V, NP   650 mg at 07/12/24 0208   alum & mag hydroxide-simeth (MAALOX/MYLANTA) 200-200-20 MG/5ML suspension 30 mL  30 mL Oral Q4H PRN Onuoha, Chinwendu V, NP       ARIPiprazole  (ABILIFY ) tablet 5 mg  5 mg Oral QHS Lamarco Gudiel, MD   5 mg at 07/12/24 2138   chlordiazePOXIDE  (LIBRIUM ) capsule 25 mg  25 mg Oral Q6H PRN Onuoha, Chinwendu V, NP       chlordiazePOXIDE  (LIBRIUM ) capsule 25 mg  25 mg Oral TID Onuoha, Chinwendu V, NP   25 mg at 07/13/24 9076   Followed by   NOREEN ON 07/14/2024] chlordiazePOXIDE  (LIBRIUM ) capsule 25 mg  25 mg Oral BH-qamhs Onuoha, Chinwendu V, NP       Followed by   NOREEN ON 07/15/2024] chlordiazePOXIDE  (LIBRIUM ) capsule 25 mg  25 mg Oral Daily Onuoha, Chinwendu V, NP  haloperidol  (HALDOL ) tablet 5 mg  5 mg Oral TID PRN Onuoha, Chinwendu V, NP       And   diphenhydrAMINE  (BENADRYL ) capsule 50 mg  50 mg Oral TID PRN Onuoha, Chinwendu V, NP       haloperidol  lactate (HALDOL ) injection 5 mg  5 mg Intramuscular TID PRN Onuoha, Chinwendu V, NP       And   diphenhydrAMINE  (BENADRYL ) injection 50 mg  50 mg Intramuscular TID PRN Onuoha, Chinwendu V, NP       And   LORazepam  (ATIVAN ) injection 2 mg  2 mg Intramuscular TID PRN  Onuoha, Chinwendu V, NP       haloperidol  lactate (HALDOL ) injection 10 mg  10 mg Intramuscular TID PRN Onuoha, Chinwendu V, NP       And   diphenhydrAMINE  (BENADRYL ) injection 50 mg  50 mg Intramuscular TID PRN Onuoha, Chinwendu V, NP       And   LORazepam  (ATIVAN ) injection 2 mg  2 mg Intramuscular TID PRN Onuoha, Chinwendu V, NP       hydrOXYzine  (ATARAX ) tablet 25 mg  25 mg Oral Q6H PRN Onuoha, Chinwendu V, NP   25 mg at 07/12/24 0208   loperamide  (IMODIUM ) capsule 2-4 mg  2-4 mg Oral PRN Onuoha, Chinwendu V, NP       magnesium  hydroxide (MILK OF MAGNESIA) suspension 30 mL  30 mL Oral Daily PRN Onuoha, Chinwendu V, NP       metFORMIN  (GLUCOPHAGE -XR) 24 hr tablet 500 mg  500 mg Oral Q breakfast Anthonee Gelin, MD   500 mg at 07/13/24 9076   multivitamin with minerals tablet 1 tablet  1 tablet Oral Daily Onuoha, Chinwendu V, NP   1 tablet at 07/13/24 9076   ondansetron  (ZOFRAN -ODT) disintegrating tablet 4 mg  4 mg Oral Q6H PRN Onuoha, Chinwendu V, NP       sertraline  (ZOLOFT ) tablet 50 mg  50 mg Oral QHS Alysah Carton, MD   50 mg at 07/12/24 2138   simvastatin  (ZOCOR ) tablet 10 mg  10 mg Oral QHS Tayana Shankle, MD   10 mg at 07/12/24 2137   traZODone  (DESYREL ) tablet 50 mg  50 mg Oral QHS Kayli Beal, MD   50 mg at 07/12/24 2138   Current Outpatient Medications  Medication Sig Dispense Refill   ARIPiprazole  (ABILIFY ) 15 MG tablet Take 1 tablet (15 mg total) by mouth daily. (Patient not taking: Reported on 07/11/2024) 30 tablet 0   doxycycline  (VIBRAMYCIN ) 100 MG capsule Take 1 capsule (100 mg total) by mouth 2 (two) times daily. (Patient not taking: Reported on 07/11/2024) 20 capsule 0   metFORMIN  (GLUCOPHAGE -XR) 500 MG 24 hr tablet Take 1 tablet (500 mg total) by mouth daily with breakfast. (Patient not taking: Reported on 07/11/2024) 30 tablet 0   naltrexone  (DEPADE) 50 MG tablet Take 1 tablet (50 mg total) by mouth daily. (Patient not taking: Reported on 07/11/2024) 30 tablet 0   sertraline   (ZOLOFT ) 100 MG tablet Take 1 tablet (100 mg total) by mouth at bedtime. (Patient not taking: Reported on 07/11/2024) 30 tablet 0   simvastatin  (ZOCOR ) 10 MG tablet Take 1 tablet (10 mg total) by mouth at bedtime. (Patient not taking: Reported on 07/11/2024) 30 tablet 0   traZODone  (DESYREL ) 100 MG tablet Take 2 tablets (200 mg total) by mouth at bedtime as needed for sleep (insomnia). (Patient not taking: Reported on 07/11/2024) 60 tablet 0    Labs  Lab Results:  Admission on  07/12/2024  Component Date Value Ref Range Status   Alcohol, Ethyl (B) 07/12/2024 143 (H)  <15 mg/dL Final   Comment: (NOTE) For medical purposes only. Performed at [facility], [address] [place], [place] [address]    POC Amphetamine UR 07/12/2024 None Detected  NONE DETECTED (Cut Off Level 1000 ng/mL) Final   POC Secobarbital (BAR) 07/12/2024 None Detected  NONE DETECTED (Cut Off Level 300 ng/mL) Final   POC Buprenorphine (BUP) 07/12/2024 None Detected  NONE DETECTED (Cut Off Level 10 ng/mL) Final   POC Oxazepam (BZO) 07/12/2024 None Detected  NONE DETECTED (Cut Off Level 300 ng/mL) Final   POC Cocaine UR 07/12/2024 None Detected  NONE DETECTED (Cut Off Level 300 ng/mL) Final   POC Methamphetamine UR 07/12/2024 None Detected  NONE DETECTED (Cut Off Level 1000 ng/mL) Final   POC Morphine 07/12/2024 None Detected  NONE DETECTED (Cut Off Level 300 ng/mL) Final   POC Methadone UR 07/12/2024 None Detected  NONE DETECTED (Cut Off Level 300 ng/mL) Final   POC Oxycodone UR 07/12/2024 None Detected  NONE DETECTED (Cut Off Level 100 ng/mL) Final   POC Marijuana UR 07/12/2024 None Detected  NONE DETECTED (Cut Off Level 50 ng/mL) Final  Admission on 07/10/2024, Discharged on 07/11/2024  Component Date Value Ref Range Status   Sodium 07/10/2024 140  135 - 145 mmol/L Final   Potassium 07/10/2024 3.6  3.5 - 5.1 mmol/L Final   Chloride 07/10/2024 105  98 - 111 mmol/L Final   CO2 07/10/2024 20 (L)  22 - 32 mmol/L Final    Glucose, Bld 07/10/2024 98  70 - 99 mg/dL Final   Glucose reference range applies only to samples taken after fasting for at least 8 hours.   BUN 07/10/2024 9  6 - 20 mg/dL Final   Creatinine, Ser 07/10/2024 0.63  0.61 - 1.24 mg/dL Final   Calcium  07/10/2024 8.9  8.9 - 10.3 mg/dL Final   Total Protein 91/79/7974 7.3  6.5 - 8.1 g/dL Final   Albumin 91/79/7974 3.7  3.5 - 5.0 g/dL Final   AST 91/79/7974 28  15 - 41 U/L Final   ALT 07/10/2024 49 (H)  0 - 44 U/L Final   Alkaline Phosphatase 07/10/2024 65  38 - 126 U/L Final   Total Bilirubin 07/10/2024 0.6  0.0 - 1.2 mg/dL Final   GFR, Estimated 07/10/2024 >60  >60 mL/min Final   Comment: (NOTE) Calculated using the CKD-EPI Creatinine Equation (2021)    Anion gap 07/10/2024 15  5 - 15 Final   Performed at [facility], [address], [place], [place] [address]   Alcohol, Ethyl (B) 07/10/2024 106 (H)  <15 mg/dL Final   Comment: (NOTE) For medical purposes only. Performed at [facility], [address] [place], [place] [address]    WBC 07/10/2024 6.4  4.0 - 10.5 K/uL Final   RBC 07/10/2024 4.85  4.22 - 5.81 MIL/uL Final   Hemoglobin 07/10/2024 14.1  13.0 - 17.0 g/dL Final   HCT 91/79/7974 43.2  39.0 - 52.0 % Final   MCV 07/10/2024 89.1  80.0 - 100.0 fL Final   MCH 07/10/2024 29.1  26.0 - 34.0 pg Final   MCHC 07/10/2024 32.6  30.0 - 36.0 g/dL Final   RDW 91/79/7974 14.0  11.5 - 15.5 % Final   Platelets 07/10/2024 368  150 - 400 K/uL Final   nRBC 07/10/2024 0.0  0.0 - 0.2 % Final   Performed at  [facility], [address], [place], [place] [address]   Opiates 07/10/2024 NONE DETECTED  NONE DETECTED Final   Cocaine 07/10/2024 NONE DETECTED  NONE DETECTED Final   Benzodiazepines 07/10/2024 NONE DETECTED  NONE DETECTED Final   Amphetamines 07/10/2024 NONE DETECTED  NONE DETECTED Final   Tetrahydrocannabinol 07/10/2024 NONE DETECTED  NONE DETECTED Final   Barbiturates 07/10/2024 NONE DETECTED  NONE DETECTED Final    Comment: (NOTE) DRUG SCREEN FOR MEDICAL PURPOSES ONLY.  IF CONFIRMATION IS NEEDED FOR ANY PURPOSE, NOTIFY LAB WITHIN 5 DAYS.  LOWEST DETECTABLE LIMITS FOR URINE DRUG SCREEN Drug Class                     Cutoff (ng/mL) Amphetamine and metabolites    1000 Barbiturate and metabolites    200 Benzodiazepine                 200 Opiates and metabolites        300 Cocaine and metabolites        300 THC                            50 Performed at [facility], [address], [place], [place] [address]   Admission on 04/25/2024, Discharged on 05/01/2024  Component Date Value Ref Range Status   Hgb A1c MFr Bld 04/27/2024 5.6  4.8 - 5.6 % Final   Comment: (NOTE)         Prediabetes: 5.7 - 6.4         Diabetes: >6.4         Glycemic control for adults with diabetes: <7.0    Mean Plasma Glucose 04/27/2024 114  mg/dL Final   Comment: (NOTE) Performed At: [facility] [address] [place], [place] [id number] Jennette Shorter MD Ey:[id number]    Cholesterol 04/27/2024 204 (H)  0 - 200 mg/dL Final   Triglycerides 93/92/7974 242 (H)  <150 mg/dL Final   HDL 93/92/7974 34 (L)  >40 mg/dL Final   Total CHOL/HDL Ratio 04/27/2024 6.0  RATIO Final   VLDL 04/27/2024 48 (H)  0 - 40 mg/dL Final   LDL Cholesterol 04/27/2024 122 (H)  0 - 99 mg/dL Final   Comment:        Total Cholesterol/HDL:CHD Risk Coronary Heart Disease Risk Table                     Men   Women  1/2 Average Risk   3.4   3.3  Average Risk       5.0   4.4  2 X Average Risk   9.6   7.1  3 X Average Risk  23.4   11.0        Use the calculated Patient Ratio above and the CHD Risk Table to determine the patient's CHD Risk.        ATP III CLASSIFICATION (LDL):  <100     mg/dL   Optimal  899-870  mg/dL   Near or Above                    Optimal  130-159  mg/dL   Borderline  839-810  mg/dL   High  >809     mg/dL   Very High Performed at [facility], [address] [place], [place] [address]     RPR Ser Ql 04/27/2024 NON REACTIVE  NON REACTIVE Final   Performed at [facility], [address], [place], [place] [address]   TSH 04/27/2024 1.247  0.350 - 4.500 uIU/mL Final   Comment: Performed by a 3rd Generation assay with a functional sensitivity of <=0.01 uIU/mL. Performed at [facility], [address], [place], [place] [address]    HIV Screen 4th Generation wRfx 04/27/2024 Non Reactive  Non Reactive Final   Performed at [facility], [address], [place], [place] [address]  Admission on 04/24/2024, Discharged on 04/25/2024  Component Date Value Ref Range Status   Sodium 04/24/2024 140  135 - 145 mmol/L Final   Potassium 04/24/2024 3.7  3.5 - 5.1 mmol/L Final   Chloride 04/24/2024 102  98 - 111 mmol/L Final   CO2 04/24/2024 23  22 - 32 mmol/L Final   Glucose, Bld 04/24/2024 94  70 - 99 mg/dL Final   Glucose reference range applies only to samples taken after fasting for at least 8 hours.   BUN 04/24/2024 15  6 - 20 mg/dL Final   Creatinine, Ser 04/24/2024 0.97  0.61 - 1.24 mg/dL Final   Calcium  04/24/2024 9.2  8.9 - 10.3 mg/dL Final   Total Protein 93/95/7974 7.6  6.5 - 8.1 g/dL Final   Albumin 93/95/7974 3.7  3.5 - 5.0 g/dL Final   AST 93/95/7974 36  15 - 41 U/L Final   ALT 04/24/2024 44  0 - 44 U/L Final   Alkaline Phosphatase 04/24/2024 56  38 - 126 U/L Final   Total Bilirubin 04/24/2024 0.6  0.0 - 1.2 mg/dL Final   GFR, Estimated 04/24/2024 >60  >60 mL/min Final   Comment: (NOTE) Calculated using the CKD-EPI Creatinine Equation (2021)    Anion gap 04/24/2024 15  5 - 15 Final   Performed at [facility], [address] [place], [place] [address]   Alcohol, Ethyl (B) 04/24/2024 179 (H)  <15 mg/dL Final   Comment: (NOTE) For medical purposes only. Performed at [facility], [address] [place], [place] [address]    WBC 04/24/2024 7.2  4.0 - 10.5 K/uL Final   RBC 04/24/2024 4.80  4.22 - 5.81 MIL/uL Final    Hemoglobin 04/24/2024 13.4  13.0 - 17.0 g/dL Final   HCT 93/95/7974 41.8  39.0 - 52.0 % Final   MCV 04/24/2024 87.1  80.0 - 100.0 fL Final   MCH 04/24/2024 27.9  26.0 - 34.0 pg Final   MCHC 04/24/2024 32.1  30.0 - 36.0 g/dL Final   RDW 93/95/7974 14.6  11.5 - 15.5 % Final   Platelets 04/24/2024 370  150 - 400 K/uL Final   nRBC 04/24/2024 0.0  0.0 - 0.2 % Final   Performed at [facility], [address], [place], [place] [address]  Admission on 04/20/2024, Discharged on 04/21/2024  Component Date Value Ref Range Status   Sodium 04/20/2024 137  135 - 145 mmol/L Final   Potassium 04/20/2024 3.2 (L)  3.5 - 5.1 mmol/L Final   Chloride 04/20/2024 101  98 - 111 mmol/L Final   CO2 04/20/2024 23  22 - 32 mmol/L Final   Glucose, Bld 04/20/2024 100 (H)  70 - 99 mg/dL Final   Glucose reference range applies only to samples taken after fasting for at least 8 hours.   BUN 04/20/2024 14  6 - 20 mg/dL Final   Creatinine, Ser 04/20/2024 1.08  0.61 - 1.24 mg/dL Final  Calcium  04/20/2024 8.6 (L)  8.9 - 10.3 mg/dL Final   GFR, Estimated 04/20/2024 >60  >60 mL/min Final   Comment: (NOTE) Calculated using the CKD-EPI Creatinine Equation (2021)    Anion gap 04/20/2024 13  5 - 15 Final   Performed at [facility], [address], [place], [place] [address]   WBC 04/20/2024 8.7  4.0 - 10.5 K/uL Final   RBC 04/20/2024 5.02  4.22 - 5.81 MIL/uL Final   Hemoglobin 04/20/2024 14.2  13.0 - 17.0 g/dL Final   HCT 94/68/7974 44.6  39.0 - 52.0 % Final   MCV 04/20/2024 88.8  80.0 - 100.0 fL Final   MCH 04/20/2024 28.3  26.0 - 34.0 pg Final   MCHC 04/20/2024 31.8  30.0 - 36.0 g/dL Final   RDW 94/68/7974 15.1  11.5 - 15.5 % Final   Platelets 04/20/2024 417 (H)  150 - 400 K/uL Final   nRBC 04/20/2024 0.0  0.0 - 0.2 % Final   Performed at [facility], [address] [place] [address] [place], [place] [address]   Opiates 04/20/2024 NONE DETECTED  NONE DETECTED Final   Cocaine 04/20/2024  NONE DETECTED  NONE DETECTED Final   Benzodiazepines 04/20/2024 NONE DETECTED  NONE DETECTED Final   Amphetamines 04/20/2024 NONE DETECTED  NONE DETECTED Final   Tetrahydrocannabinol 04/20/2024 NONE DETECTED  NONE DETECTED Final   Barbiturates 04/20/2024 NONE DETECTED  NONE DETECTED Final   Comment: (NOTE) DRUG SCREEN FOR MEDICAL PURPOSES ONLY.  IF CONFIRMATION IS NEEDED FOR ANY PURPOSE, NOTIFY LAB WITHIN 5 DAYS.  LOWEST DETECTABLE LIMITS FOR URINE DRUG SCREEN Drug Class                     Cutoff (ng/mL) Amphetamine and metabolites    1000 Barbiturate and metabolites    200 Benzodiazepine                 200 Opiates and metabolites        300 Cocaine and metabolites        300 THC                            50 Performed at [facility], [address] [place], [place] [address]    Alcohol, Ethyl (B) 04/20/2024 130 (H)  <15 mg/dL Final   Comment: (NOTE) For medical purposes only. Performed at [facility], [address] [place], [place] [address]     Blood Alcohol level:  Lab Results  Component Value Date   ETH 143 (H) 07/12/2024   ETH 106 (H) 07/10/2024    Metabolic Disorder Labs: Lab Results  Component Value Date   HGBA1C 5.6 04/27/2024   MPG 114 04/27/2024   MPG 122.63 10/10/2023   No results found for: PROLACTIN Lab Results  Component Value Date   CHOL 204 (H) 04/27/2024   TRIG 242 (H) 04/27/2024   HDL 34 (L) 04/27/2024   CHOLHDL 6.0 04/27/2024   VLDL 48 (H) 04/27/2024   LDLCALC 122 (H) 04/27/2024   LDLCALC 81 10/07/2023    Therapeutic Lab Levels: No results found for: LITHIUM No results found for: VALPROATE No results found for: CBMZ  Physical Findings   AIMS    Flowsheet Row [facility]) [facility]  AIMS Total Score 0 0   AUDIT  Flowsheet Row  ED from 07/12/2024 in [facility] Admission (Discharged) [facility] Admission (Discharged) [facility] Ad[facility] (Discharged) [facility]  Alcohol Use Disorder Identification Test Final Score (AUDIT) 17 20 30 27    PHQ2-9    Flowsheet Row ED from 07/12/2024 in [facility] ED from 10/07/2023 in [facility]  PHQ-2 Total Score 3 0  PHQ-9 Total Score 23 10   Flowsheet Row ED from 07/12/2024 in [facility] ED from 07/11/2024 in [facility] ED from 07/10/2024 in [facility] Emergency Department at [facility]  C-SSRS RISK CATEGORY High Risk High Risk High Risk     Musculoskeletal  Strength & Muscle Tone: within normal limits Gait & Station: normal Patient leans: N/A  Psychiatric Specialty Exam  Presentation  General Appearance:  Casual  Eye Contact: None  Speech: slow  Speech Volume: Decreased  Handedness: Right   Mood and Affect  Mood: Depressed  Affect: Congruent   Thought Process  Thought Processes: Coherent  Descriptions of Associations:Intact  Orientation:Full (Time, Place and Person)  Thought Content:WDL  Diagnosis of Schizophrenia or Schizoaffective disorder in past: No  Duration of Psychotic Symptoms: Less than six months   Hallucinations:Hallucinations: None  Ideas of Reference:None  Suicidal Thoughts: SI without a plan or intent  Homicidal Thoughts:Homicidal Thoughts: No   Sensorium  Memory: Immediate Fair  Judgment: Poor  Insight: Lacking   Executive Functions  Concentration: Poor  Attention Span: Poor  Recall: Poor  Fund of Knowledge: Poor  Language: Poor   Psychomotor Activity  Psychomotor Activity: Psychomotor  Activity: Normal   Assets  Assets: Communication Skills; Desire for Improvement   Sleep  Sleep: Sleep: Fair  Estimated Sleeping Duration (Last 24 Hours): 13.50-13.75 hours  Nutritional Assessment (For OBS and [facility]BC admissions only) Has the patient had a weight loss or gain of 10 pounds or more in the last 3 months?: No Has the patient had a decrease in food intake/or appetite?: No Does the patient have dental problems?: No Does the patient have eating habits or behaviors that may be indicators of an eating disorder including binging or inducing vomiting?: No Has the patient recently lost weight without trying?: 0 Has the patient been eating poorly because of a decreased appetite?: 0 Malnutrition Screening Tool Score: 0    Physical Exam  Physical exam: Please see exam on admit note. General: Well developed, well nourished.  Pupils: Normal at 3mm Respiratory: Breathing is unlabored.  Cardiovascular: No edema.  Language: No anomia, no aphasia Muscle strength and tone-pt moving all extremities.  Gait not assessed as pt remained in bed.  Neuro: Facial muscles are symmetric. Pt without tremor, no evidence of hyperarousal.  Review of Systems  Constitutional: Negative.   HENT: Negative.    Eyes: Negative.   Respiratory: Negative.    Cardiovascular: Negative.   Gastrointestinal: Negative.   Genitourinary: Negative.   Musculoskeletal: Negative.   Skin: Negative.   Neurological: Negative.   Endo/Heme/Allergies: Negative.   Psychiatric/Behavioral:  Positive for depression, substance abuse and suicidal ideas. The patient is nervous/anxious and has insomnia.    Blood pressure 104/65, pulse 73, temperature 97.7 F (36.5 C), resp. rate 17, SpO2 98%. There is no height or weight on file to calculate BMI.  Treatment Plan Summary: ASSESSMENT: Principal Problem:   MDD (major  depressive disorder), recurrent, severe, with psychosis (HCC) Active Problems:   Alcohol dependence (HCC)    Patient presenting with worsening depression and suicidal ideation. Previous suicidal gestures of holding a gun to his head. Reports thoughts of shooting himself the past week and reports SI with plan to shoot himself today but would like to get into inpatient rehab. Detoxing from alcohol on librium  taper currently. Denies access to gun.  Patient reports he started drinking since April due to multiple psychosocial stressors. Patient has recently been experiencing  AH of derogatory voices since April as well as recurring this past week. Previously responded well to Abilify  and is interested in restarting. Patient at high acute risk for suicide.  8/23: patient reports SI without a plan and depression. Improving sleep and appetite. Does appear significantly depressed. AH and paranoia are resolved. Patient is tolerating medications well today. Patient at high acute risk for suicide. History of suicidal gesture of holding a gun to his head. Interested in inpatient rehab and completing Librium  taper.    Treatment Plan Summary: Daily contact with patient to assess and evaluate symptoms and progress in treatment, Medication management, as below   Observation Level/Precautions:  15 minute checks  Laboratory:  CBC Chemistry Profile Folic Acid  HbAIC UDS UA Vitamin B-12  Psychotherapy:    Medications:  Zoloft , hydroxyzine , trazodone   Consultations:    Discharge Concerns:  rehab referrals  Estimated LOS:4-6 days  Other:      Safety and Monitoring: voluntarily admission to inpatient psychiatric unit for safety, stabilization and treatment Daily contact with patient to assess and evaluate symptoms and progress in treatment Patient's case to be discussed in multi-disciplinary team meeting Observation Level : q15 minute checks Vital signs: q12 hours Precautions: suicide, elopement, and assault   2. Psychiatric Problems #MDD, severe recurrent with psychotic features Cont sertraline  to 50 mg  qdaily -cont Abilify  5 mg qdaily for mood stabilization, agitation and reported auditory hallucinations  cont trazodone  50 mg at bedtime for insomnia   #alcohol use disorder severe without withdrawal - continue Librium  taper - maintain on CIWA protocol + PRN Librium  -will start Naltrexone  50 mg qdaily if above medications are well tolerated     3. Medical Management Covid negative CMP: wnl CBC: unremarkable EtOH: <10 UDS: wnl TSH: ordered AM draw A1C: ordered AM draw Lipids: ordered AM draw     #Dm2 Cont  metformin  500 mg qdaily   #HLD,  cont simvastatin  10 mg qdaily   #Dispo: inpatient rehab once patient has finished alcohol detox   Physician Treatment Plan for Primary Diagnosis: MDD (major depressive disorder), recurrent, severe, with psychosis (HCC) Long Term Goal(s): Improvement in symptoms so as ready for discharge   Short Term Goals: Ability to identify changes in lifestyle to reduce recurrence of condition will improve, Ability to verbalize feelings will improve, Ability to disclose and discuss suicidal ideas, Ability to demonstrate self-control will improve, Ability to identify and develop effective coping behaviors will improve, Ability to maintain clinical measurements within normal limits will improve, Compliance with prescribed medications will improve, and Ability to identify triggers associated with substance abuse/mental health issues will improve    I certify that inpatient services furnished can reasonably be expected to improve the patient's condition.      Makenzee Choudhry, MD 07/13/2024 3:17 PM

## 2024-07-13 NOTE — ED Notes (Signed)

## 2024-07-13 NOTE — ED Notes (Signed)
 Patient reported doing well. Alert X 4. Denied SI/HI and AVH. He eats adequate No sleep disturbances noted. Hygiene is maintained. No change to BM. Med complaint. Calm and quiet. No an issue noted during this shift.

## 2024-07-13 NOTE — ED Notes (Signed)
 Everything is quiet and patient is still sleeping

## 2024-07-13 NOTE — Group Note (Signed)
 Group Topic: Relaxation  Group Date: 07/13/2024 Start Time: 1315 End Time: 1400 Facilitators: Laneta Renea POUR, NT  Department: [facility]  Number of Participants: 1  Group Focus: psychiatric education and reminiscence Treatment Modality:  Psychoeducation Interventions utilized were patient education Purpose: increase insight  Name: Aaron [name] Date of Birth: 1976-05-13  MR: [id number]    Level of Participation: Did not attend Group Quality of Participation: none Interactions with others: none Mood/Affect: N/A Triggers (if applicable): N/A Cognition: N/A Progress: None Response: Encouraged to attend group Plan: patient will be encouraged to attend Group.   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

## 2024-07-13 NOTE — Group Note (Signed)
 Group Topic: Social Support  Group Date: 07/13/2024 Start Time: 1200 End Time: 1230 Facilitators: Javaria Knapke, Zane HERO, RN  Department: [facility]  Number of Participants: 1  Group Focus: check in Treatment Modality:  Individual Therapy Interventions utilized were patient education Purpose: social support Name: Aaron [name] Date of Birth: 1976-04-11  MR: [id number]    Level of Participation: minimal Quality of Participation: cooperative Interactions with others: minimal Mood/Affect: appropriate Triggers (if applicable): None identified Cognition: coherent/clear Progress: Gaining insight Response: Patient spoke with Clinical research associate regarding stay. Concerns and thoughts shared. Support provided. Plan: patient will be encouraged to continue to attend groups and programs on the unit.  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

## 2024-07-13 NOTE — ED Notes (Signed)
 Patient resting with eyes closed in no apparent acute distress. Respirations even and unlabored. Environment secured. Safety checks in place according to facility policy.

## 2024-07-13 NOTE — ED Notes (Signed)
 Patient is sleeping and the milieu is quiet.

## 2024-07-14 DIAGNOSIS — F333 Major depressive disorder, recurrent, severe with psychotic symptoms: Secondary | ICD-10-CM | POA: Diagnosis not present

## 2024-07-14 DIAGNOSIS — R45851 Suicidal ideations: Secondary | ICD-10-CM | POA: Diagnosis not present

## 2024-07-14 DIAGNOSIS — F10120 Alcohol abuse with intoxication, uncomplicated: Secondary | ICD-10-CM | POA: Diagnosis not present

## 2024-07-14 DIAGNOSIS — Z5900 Homelessness unspecified: Secondary | ICD-10-CM | POA: Diagnosis not present

## 2024-07-14 MED ORDER — NALTREXONE HCL 50 MG PO TABS
50.0000 mg | ORAL_TABLET | Freq: Every day | ORAL | Status: DC
  Administered 2024-07-14 – 2024-07-16 (×3): 50 mg via ORAL
  Filled 2024-07-14 (×3): qty 1
  Filled 2024-07-14: qty 7

## 2024-07-14 NOTE — ED Notes (Signed)
 Pt observed sitting in dayroom, talking with peers.  Behaviors appropriate.   Denied current SI plan and intent.  Denied HI and A/V hallucinations.   Pt denied current ETOh withdrawal symptoms. Q 15 minute observations for safety continue

## 2024-07-14 NOTE — ED Notes (Signed)
 Patient is in the bedroom composed and sleeping. NAD Respirations are even and unlabored. Will monitor for safety.

## 2024-07-14 NOTE — Group Note (Signed)
 Group Topic: Balance in Life  Group Date: 07/14/2024 Start Time: 1515 End Time: 1545 Facilitators: Carletha Iha, RN  Department: [facility]  Number of Participants: 9  Group Focus: coping skills Treatment Modality:  Psychoeducation Interventions utilized were patient education Purpose: enhance coping skills  Name: Aaron [name] Date of Birth: 14-Aug-1976  MR: [id number]    Level of Participation: did not attend Quality of Participation:  Interactions with others:  Mood/Affect:  Triggers (if applicable):  Cognition:  Progress:  Response:  Plan:   Patients Problems:  Patient Active Problem List   Diagnosis Date Noted   Alcohol abuse with intoxication, uncomplicated (HCC) 07/12/2024   Malingering 07/11/2024   MDD (major depressive disorder), recurrent severe, without psychosis (HCC) 04/25/2024   Suicidal ideation    Alcohol withdrawal without perceptual disturbances (HCC) 04/08/2021   Alcohol use disorder, severe, dependence (HCC) 04/08/2021   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

## 2024-07-14 NOTE — ED Notes (Signed)
 Pt observed lying in bed. Eyes closed respirations even and non labored. NAD q 15 minute observations continue for safety.

## 2024-07-14 NOTE — ED Notes (Signed)
 Pt here for ETOH detox. Pt is on Librium  taper being monitored via CIWA protocol. CIWA scores  have been 0.  No ETOH withdrawal symptoms noted or reported Q 15 minute observations for safety continue

## 2024-07-14 NOTE — ED Notes (Signed)
 Patient is in the bedroom calm and composed. NAD  Environment secured per policy. Will monitor for safety.

## 2024-07-14 NOTE — ED Provider Notes (Signed)
 Behavioral Health Progress Note  Date and Time: 07/14/2024 12:28 PM Name: Aaron [name] MRN:  [id number]  ID: 48 year old male with psychiatric history of MDD with psychosis, Anxiety, malingering, alcohol use disorder, severe dependence, suicidal ideations, who presented voluntarily as a walk-in to [facility] with complaints of worsening depression, SI, with a plan to shoot himself and HI without plan/intent.   Patient well known to me from prior admission  at [facility] in 04/2024. He did well on combination of Zoloft  and Abilify  and is tolerating restarting this medications.   Subjective: Patient continues to present with depressed mood, hopelessness, anxiety, poor concentration and negative ruminations. Patient continues to ruminate of lack of support outside the hospital and substance abuse. Reports feelings of guilt and states I'm just tired I don't know what to do anymore. Patient is interested in inpatient rehab referral or sober living. Patient agreeable to starting naltrexone  for alcohol cravings and alcohol use disorder.  Denies any AVH or paranoia today.Tolerating Librium  taper well with CIWA of 0 today. Denies SI today. Reports improving sleep and appetite. Denies AVH. Had suicidal gestures of holding a gun to his head in the past but no longer has access to gun  Diagnosis:  Final diagnoses:  Alcohol abuse with intoxication, uncomplicated (HCC)  Suicidal ideations  Homelessness  Alcohol-induced mood disorder with depressive symptoms (HCC)  MDD (major depressive disorder), recurrent, severe, with psychosis (HCC)    Total Time spent with patient: 30 minutes  Past Psychiatric History:  Previous Psych Diagnoses:  alcohol use disorder, depression Prior inpatient treatment: reports 6 admissions throughout his life, was admitted to [facility] last year fo Current/prior outpatient treatment: poor outpatient followup Prior rehab hx: denies Psychotherapy hx: denies History of suicide: denies  but had suicidal gestures of holding a gun to his head in the past but no longer has access to gun History of homicide: denies Psychiatric medication history: Zoloft , Abilify , naltrexone  Psychiatric medication compliance history: poor  Substance Abuse Hx: Alcohol: patient with 4 DUIS, states he has had blackouts previously, patient states for the past week he had been drinking 8 shots a day, multiple drinks at the bar Tobacco: reports 2-3 cigarettes a day, declines patch  Illicit drugs: denies Rx drug abuse:  denies Rehab hx:  denies   Past Medical History: Medical Diagnoses: DM2, HLD Head trauma, LOC, concussions, seizures: denies Allergies: NKDA     Family History: Psych: denies Psych Rx: denies SA/HA: denies  Social History: Patient has a daughter in [place], [place] . Patient's mother is in [place]  Additional Social History:                         Sleep: Poor  Appetite:  Poor  Current Medications:  Current Facility-Administered Medications  Medication Dose Route Frequency Provider Last Rate Last Admin   acetaminophen  (TYLENOL ) tablet 650 mg  650 mg Oral Q6H PRN Onuoha, Chinwendu V, NP   650 mg at 07/12/24 0208   alum & mag hydroxide-simeth (MAALOX/MYLANTA) 200-200-20 MG/5ML suspension 30 mL  30 mL Oral Q4H PRN Onuoha, Chinwendu V, NP       ARIPiprazole  (ABILIFY ) tablet 5 mg  5 mg Oral QHS Saima Monterroso, MD   5 mg at 07/13/24 2126   chlordiazePOXIDE  (LIBRIUM ) capsule 25 mg  25 mg Oral Q6H PRN Onuoha, Chinwendu V, NP       chlordiazePOXIDE  (LIBRIUM ) capsule 25 mg  25 mg Oral BH-qamhs Onuoha, Chinwendu V, NP   25  mg at 07/14/24 0945   Followed by   NOREEN ON 07/15/2024] chlordiazePOXIDE  (LIBRIUM ) capsule 25 mg  25 mg Oral Daily Onuoha, Chinwendu V, NP       haloperidol  (HALDOL ) tablet 5 mg  5 mg Oral TID PRN Onuoha, Chinwendu V, NP       And   diphenhydrAMINE  (BENADRYL ) capsule 50 mg  50 mg Oral TID PRN Onuoha, Chinwendu V, NP       haloperidol  lactate  (HALDOL ) injection 5 mg  5 mg Intramuscular TID PRN Onuoha, Chinwendu V, NP       And   diphenhydrAMINE  (BENADRYL ) injection 50 mg  50 mg Intramuscular TID PRN Onuoha, Chinwendu V, NP       And   LORazepam  (ATIVAN ) injection 2 mg  2 mg Intramuscular TID PRN Onuoha, Chinwendu V, NP       haloperidol  lactate (HALDOL ) injection 10 mg  10 mg Intramuscular TID PRN Onuoha, Chinwendu V, NP       And   diphenhydrAMINE  (BENADRYL ) injection 50 mg  50 mg Intramuscular TID PRN Onuoha, Chinwendu V, NP       And   LORazepam  (ATIVAN ) injection 2 mg  2 mg Intramuscular TID PRN Onuoha, Chinwendu V, NP       hydrOXYzine  (ATARAX ) tablet 25 mg  25 mg Oral Q6H PRN Onuoha, Chinwendu V, NP   25 mg at 07/12/24 0208   loperamide  (IMODIUM ) capsule 2-4 mg  2-4 mg Oral PRN Onuoha, Chinwendu V, NP       magnesium  hydroxide (MILK OF MAGNESIA) suspension 30 mL  30 mL Oral Daily PRN Onuoha, Chinwendu V, NP       metFORMIN  (GLUCOPHAGE -XR) 24 hr tablet 500 mg  500 mg Oral Q breakfast Stephene Alegria, MD   500 mg at 07/14/24 0900   multivitamin with minerals tablet 1 tablet  1 tablet Oral Daily Onuoha, Chinwendu V, NP   1 tablet at 07/14/24 9050   naltrexone  (DEPADE) tablet 50 mg  50 mg Oral Daily Damario Gillie, MD       ondansetron  (ZOFRAN -ODT) disintegrating tablet 4 mg  4 mg Oral Q6H PRN Onuoha, Chinwendu V, NP       sertraline  (ZOLOFT ) tablet 50 mg  50 mg Oral QHS Brave Dack, MD   50 mg at 07/13/24 2126   simvastatin  (ZOCOR ) tablet 10 mg  10 mg Oral QHS Lynnda Wiersma, MD   10 mg at 07/13/24 2126   traZODone  (DESYREL ) tablet 50 mg  50 mg Oral QHS Azjah Pardo, MD   50 mg at 07/13/24 2126   Current Outpatient Medications  Medication Sig Dispense Refill   ARIPiprazole  (ABILIFY ) 15 MG tablet Take 1 tablet (15 mg total) by mouth daily. (Patient not taking: Reported on 07/11/2024) 30 tablet 0   doxycycline  (VIBRAMYCIN ) 100 MG capsule Take 1 capsule (100 mg total) by mouth 2 (two) times daily. (Patient not taking: Reported on  07/11/2024) 20 capsule 0   metFORMIN  (GLUCOPHAGE -XR) 500 MG 24 hr tablet Take 1 tablet (500 mg total) by mouth daily with breakfast. (Patient not taking: Reported on 07/11/2024) 30 tablet 0   naltrexone  (DEPADE) 50 MG tablet Take 1 tablet (50 mg total) by mouth daily. (Patient not taking: Reported on 07/11/2024) 30 tablet 0   sertraline  (ZOLOFT ) 100 MG tablet Take 1 tablet (100 mg total) by mouth at bedtime. (Patient not taking: Reported on 07/11/2024) 30 tablet 0   simvastatin  (ZOCOR ) 10 MG tablet Take 1 tablet (10 mg total) by mouth  at bedtime. (Patient not taking: Reported on 07/11/2024) 30 tablet 0   traZODone  (DESYREL ) 100 MG tablet Take 2 tablets (200 mg total) by mouth at bedtime as needed for sleep (insomnia). (Patient not taking: Reported on 07/11/2024) 60 tablet 0    Labs  Lab Results:  Admission on 07/12/2024  Component Date Value Ref Range Status   Alcohol, Ethyl (B) 07/12/2024 143 (H)  <15 mg/dL Final   Comment: (NOTE) For medical purposes only. Performed at [facility], [address] [place], [place] [address]    POC Amphetamine UR 07/12/2024 None Detected  NONE DETECTED (Cut Off Level 1000 ng/mL) Final   POC Secobarbital (BAR) 07/12/2024 None Detected  NONE DETECTED (Cut Off Level 300 ng/mL) Final   POC Buprenorphine (BUP) 07/12/2024 None Detected  NONE DETECTED (Cut Off Level 10 ng/mL) Final   POC Oxazepam (BZO) 07/12/2024 None Detected  NONE DETECTED (Cut Off Level 300 ng/mL) Final   POC Cocaine UR 07/12/2024 None Detected  NONE DETECTED (Cut Off Level 300 ng/mL) Final   POC Methamphetamine UR 07/12/2024 None Detected  NONE DETECTED (Cut Off Level 1000 ng/mL) Final   POC Morphine 07/12/2024 None Detected  NONE DETECTED (Cut Off Level 300 ng/mL) Final   POC Methadone UR 07/12/2024 None Detected  NONE DETECTED (Cut Off Level 300 ng/mL) Final   POC Oxycodone UR 07/12/2024 None Detected  NONE DETECTED (Cut Off Level 100 ng/mL) Final   POC Marijuana UR 07/12/2024 None  Detected  NONE DETECTED (Cut Off Level 50 ng/mL) Final  Admission on 07/10/2024, Discharged on 07/11/2024  Component Date Value Ref Range Status   Sodium 07/10/2024 140  135 - 145 mmol/L Final   Potassium 07/10/2024 3.6  3.5 - 5.1 mmol/L Final   Chloride 07/10/2024 105  98 - 111 mmol/L Final   CO2 07/10/2024 20 (L)  22 - 32 mmol/L Final   Glucose, Bld 07/10/2024 98  70 - 99 mg/dL Final   Glucose reference range applies only to samples taken after fasting for at least 8 hours.   BUN 07/10/2024 9  6 - 20 mg/dL Final   Creatinine, Ser 07/10/2024 0.63  0.61 - 1.24 mg/dL Final   Calcium  07/10/2024 8.9  8.9 - 10.3 mg/dL Final   Total Protein 91/79/7974 7.3  6.5 - 8.1 g/dL Final   Albumin 91/79/7974 3.7  3.5 - 5.0 g/dL Final   AST 91/79/7974 28  15 - 41 U/L Final   ALT 07/10/2024 49 (H)  0 - 44 U/L Final   Alkaline Phosphatase 07/10/2024 65  38 - 126 U/L Final   Total Bilirubin 07/10/2024 0.6  0.0 - 1.2 mg/dL Final   GFR, Estimated 07/10/2024 >60  >60 mL/min Final   Comment: (NOTE) Calculated using the CKD-EPI Creatinine Equation (2021)    Anion gap 07/10/2024 15  5 - 15 Final   Performed at [facility], [address], [place], [place] [address]   Alcohol, Ethyl (B) 07/10/2024 106 (H)  <15 mg/dL Final   Comment: (NOTE) For medical purposes only. Performed at [facility], [address] [place], [place] [address]    WBC 07/10/2024 6.4  4.0 - 10.5 K/uL Final   RBC 07/10/2024 4.85  4.22 - 5.81 MIL/uL Final   Hemoglobin 07/10/2024 14.1  13.0 - 17.0 g/dL Final   HCT 91/79/7974 43.2  39.0 - 52.0 % Final   MCV 07/10/2024 89.1  80.0 - 100.0 fL Final   MCH 07/10/2024 29.1  26.0 -  34.0 pg Final   MCHC 07/10/2024 32.6  30.0 - 36.0 g/dL Final   RDW 91/79/7974 14.0  11.5 - 15.5 % Final   Platelets 07/10/2024 368  150 - 400 K/uL Final   nRBC 07/10/2024 0.0  0.0 - 0.2 % Final   Performed at [facility], [address], [place], [place] [address]   Opiates 07/10/2024 NONE  DETECTED  NONE DETECTED Final   Cocaine 07/10/2024 NONE DETECTED  NONE DETECTED Final   Benzodiazepines 07/10/2024 NONE DETECTED  NONE DETECTED Final   Amphetamines 07/10/2024 NONE DETECTED  NONE DETECTED Final   Tetrahydrocannabinol 07/10/2024 NONE DETECTED  NONE DETECTED Final   Barbiturates 07/10/2024 NONE DETECTED  NONE DETECTED Final   Comment: (NOTE) DRUG SCREEN FOR MEDICAL PURPOSES ONLY.  IF CONFIRMATION IS NEEDED FOR ANY PURPOSE, NOTIFY LAB WITHIN 5 DAYS.  LOWEST DETECTABLE LIMITS FOR URINE DRUG SCREEN Drug Class                     Cutoff (ng/mL) Amphetamine and metabolites    1000 Barbiturate and metabolites    200 Benzodiazepine                 200 Opiates and metabolites        300 Cocaine and metabolites        300 THC                            50 Performed at [facility], [address], [place], [place] [address]   Admission on 04/25/2024, Discharged on 05/01/2024  Component Date Value Ref Range Status   Hgb A1c MFr Bld 04/27/2024 5.6  4.8 - 5.6 % Final   Comment: (NOTE)         Prediabetes: 5.7 - 6.4         Diabetes: >6.4         Glycemic control for adults with diabetes: <7.0    Mean Plasma Glucose 04/27/2024 114  mg/dL Final   Comment: (NOTE) Performed At: [facility] [address] [place], [place] [id number] Jennette Shorter MD Ey:[id number]    Cholesterol 04/27/2024 204 (H)  0 - 200 mg/dL Final   Triglycerides 93/92/7974 242 (H)  <150 mg/dL Final   HDL 93/92/7974 34 (L)  >40 mg/dL Final   Total CHOL/HDL Ratio 04/27/2024 6.0  RATIO Final   VLDL 04/27/2024 48 (H)  0 - 40 mg/dL Final   LDL Cholesterol 04/27/2024 122 (H)  0 - 99 mg/dL Final   Comment:        Total Cholesterol/HDL:CHD Risk Coronary Heart Disease Risk Table                     Men   Women  1/2 Average Risk   3.4   3.3  Average Risk       5.0   4.4  2 X Average Risk   9.6   7.1  3 X Average Risk  23.4   11.0        Use the calculated Patient Ratio above and the CHD  Risk Table to determine the patient's CHD Risk.        ATP III CLASSIFICATION (LDL):  <100     mg/dL   Optimal  899-870  mg/dL   Near or Above  Optimal  130-159  mg/dL   Borderline  839-810  mg/dL   High  >809     mg/dL   Very High Performed at [facility], [address], [place], [place] [address]    RPR Ser Ql 04/27/2024 NON REACTIVE  NON REACTIVE Final   Performed at [facility], [address], [place], [place] [address]   TSH 04/27/2024 1.247  0.350 - 4.500 uIU/mL Final   Comment: Performed by a 3rd Generation assay with a functional sensitivity of <=0.01 uIU/mL. Performed at [facility], [address] [place], [place] [address]    HIV Screen 4th Generation wRfx 04/27/2024 Non Reactive  Non Reactive Final   Performed at [facility], [address] [place], [place] [address]  Admission on 04/24/2024, Discharged on 04/25/2024  Component Date Value Ref Range Status   Sodium 04/24/2024 140  135 - 145 mmol/L Final   Potassium 04/24/2024 3.7  3.5 - 5.1 mmol/L Final   Chloride 04/24/2024 102  98 - 111 mmol/L Final   CO2 04/24/2024 23  22 - 32 mmol/L Final   Glucose, Bld 04/24/2024 94  70 - 99 mg/dL Final   Glucose reference range applies only to samples taken after fasting for at least 8 hours.   BUN 04/24/2024 15  6 - 20 mg/dL Final   Creatinine, Ser 04/24/2024 0.97  0.61 - 1.24 mg/dL Final   Calcium  04/24/2024 9.2  8.9 - 10.3 mg/dL Final   Total Protein 93/95/7974 7.6  6.5 - 8.1 g/dL Final   Albumin 93/95/7974 3.7  3.5 - 5.0 g/dL Final   AST 93/95/7974 36  15 - 41 U/L Final   ALT 04/24/2024 44  0 - 44 U/L Final   Alkaline Phosphatase 04/24/2024 56  38 - 126 U/L Final   Total Bilirubin 04/24/2024 0.6  0.0 - 1.2 mg/dL Final   GFR, Estimated 04/24/2024 >60  >60 mL/min Final   Comment: (NOTE) Calculated using the CKD-EPI Creatinine Equation (2021)    Anion gap 04/24/2024 15  5 - 15 Final   Performed at  [facility], [address] [place], [place] [address]   Alcohol, Ethyl (B) 04/24/2024 179 (H)  <15 mg/dL Final   Comment: (NOTE) For medical purposes only. Performed at [facility], [address] [place], [place] [address]    WBC 04/24/2024 7.2  4.0 - 10.5 K/uL Final   RBC 04/24/2024 4.80  4.22 - 5.81 MIL/uL Final   Hemoglobin 04/24/2024 13.4  13.0 - 17.0 g/dL Final   HCT 93/95/7974 41.8  39.0 - 52.0 % Final   MCV 04/24/2024 87.1  80.0 - 100.0 fL Final   MCH 04/24/2024 27.9  26.0 - 34.0 pg Final   MCHC 04/24/2024 32.1  30.0 - 36.0 g/dL Final   RDW 93/95/7974 14.6  11.5 - 15.5 % Final   Platelets 04/24/2024 370  150 - 400 K/uL Final   nRBC 04/24/2024 0.0  0.0 - 0.2 % Final   Performed at [facility], [address], [place], [place] [address]  Admission on 04/20/2024, Discharged on 04/21/2024  Component Date Value Ref Range Status   Sodium 04/20/2024 137  135 - 145 mmol/L Final   Potassium 04/20/2024 3.2 (L)  3.5 - 5.1 mmol/L Final   Chloride 04/20/2024 101  98 - 111 mmol/L Final   CO2 04/20/2024 23  22 - 32 mmol/L Final   Glucose, Bld 04/20/2024 100 (  H)  70 - 99 mg/dL Final   Glucose reference range applies only to samples taken after fasting for at least 8 hours.   BUN 04/20/2024 14  6 - 20 mg/dL Final   Creatinine, Ser 04/20/2024 1.08  0.61 - 1.24 mg/dL Final   Calcium  04/20/2024 8.6 (L)  8.9 - 10.3 mg/dL Final   GFR, Estimated 04/20/2024 >60  >60 mL/min Final   Comment: (NOTE) Calculated using the CKD-EPI Creatinine Equation (2021)    Anion gap 04/20/2024 13  5 - 15 Final   Performed at [facility], [address] [place], [place] [address]   WBC 04/20/2024 8.7  4.0 - 10.5 K/uL Final   RBC 04/20/2024 5.02  4.22 - 5.81 MIL/uL Final   Hemoglobin 04/20/2024 14.2  13.0 - 17.0 g/dL Final   HCT 94/68/7974 44.6  39.0 - 52.0 % Final   MCV 04/20/2024 88.8  80.0 - 100.0 fL Final   MCH 04/20/2024 28.3  26.0 - 34.0 pg Final   MCHC  04/20/2024 31.8  30.0 - 36.0 g/dL Final   RDW 94/68/7974 15.1  11.5 - 15.5 % Final   Platelets 04/20/2024 417 (H)  150 - 400 K/uL Final   nRBC 04/20/2024 0.0  0.0 - 0.2 % Final   Performed at [facility], [address] [place], [place] [address]   Opiates 04/20/2024 NONE DETECTED  NONE DETECTED Final   Cocaine 04/20/2024 NONE DETECTED  NONE DETECTED Final   Benzodiazepines 04/20/2024 NONE DETECTED  NONE DETECTED Final   Amphetamines 04/20/2024 NONE DETECTED  NONE DETECTED Final   Tetrahydrocannabinol 04/20/2024 NONE DETECTED  NONE DETECTED Final   Barbiturates 04/20/2024 NONE DETECTED  NONE DETECTED Final   Comment: (NOTE) DRUG SCREEN FOR MEDICAL PURPOSES ONLY.  IF CONFIRMATION IS NEEDED FOR ANY PURPOSE, NOTIFY LAB WITHIN 5 DAYS.  LOWEST DETECTABLE LIMITS FOR URINE DRUG SCREEN Drug Class                     Cutoff (ng/mL) Amphetamine and metabolites    1000 Barbiturate and metabolites    200 Benzodiazepine                 200 Opiates and metabolites        300 Cocaine and metabolites        300 THC                            50 Performed at [facility], [address] [place], [place] [address]    Alcohol, Ethyl (B) 04/20/2024 130 (H)  <15 mg/dL Final   Comment: (NOTE) For medical purposes only. Performed at [facility], [address], [place], [place] [address]     Blood Alcohol level:  Lab Results  Component Value Date   ETH 143 (H) 07/12/2024   ETH 106 (H) 07/10/2024    Metabolic Disorder Labs: Lab Results  Component Value Date   HGBA1C 5.6 04/27/2024   MPG 114 04/27/2024   MPG 122.63 10/10/2023   No results found for: PROLACTIN Lab Results  Component Value Date   CHOL 204 (H) 04/27/2024   TRIG 242 (H) 04/27/2024   HDL 34 (L) 04/27/2024   CHOLHDL 6.0 04/27/2024   VLDL 48 (H) 04/27/2024   LDLCALC 122 (H) 04/27/2024   LDLCALC 81 10/07/2023    Therapeutic Lab Levels: No results found for:  LITHIUM No results found  for: VALPROATE No results found for: CBMZ  Physical Findings   AIMS    Flowsheet Row [facility]) [facility]) [facility]  AIMS Total Score 0 0   AUDIT    Flowsheet Row ED from 07/12/2024 in [facility] Admission (Discharged) [facility] Admission (Discharged) [facility] Ad[facility] (Discharged) [facility]  Alcohol Use Disorder Identification Test Final Score (AUDIT) 17 20 30 27    PHQ2-9    Flowsheet Row ED from 07/12/2024 in [facility] ED from 10/07/2023 in [facility]  PHQ-2 Total Score 3 0  PHQ-9 Total Score 23 10   Flowsheet Row ED from 07/12/2024 in [facility] ED from 07/11/2024 in [facility] ED from 07/10/2024 in [facility] Emergency Department at [facility]  C-SSRS RISK CATEGORY High Risk High Risk High Risk     Musculoskeletal  Strength & Muscle Tone: within normal limits Gait & Station: normal Patient leans: N/A  Psychiatric Specialty Exam  Presentation  General Appearance:  Casual  Eye Contact: None  Speech: slow  Speech Volume: Decreased  Handedness: Right   Mood and Affect  Mood: Depressed  Affect: Congruent   Thought Process  Thought Processes: Coherent  Descriptions of Associations:Intact  Orientation:Full (Time, Place and Person)  Thought Content:WDL  Diagnosis of Schizophrenia or Schizoaffective disorder in past: No  Duration of Psychotic Symptoms: Less than six months   Hallucinations:denies  Ideas of Reference:None  Suicidal Thoughts: SI without a plan or  intent  Homicidal Thoughts:denies   Sensorium  Memory: Immediate Fair  Judgment: Poor  Insight: Lacking   Executive Functions  Concentration: Poor  Attention Span: Poor  Recall: Poor  Fund of Knowledge: Poor  Language: Poor   Psychomotor Activity  Psychomotor Activity: slowed   Assets  Assets: Manufacturing systems engineer; Desire for Improvement   Sleep  Sleep: good   Physical Exam  Physical exam: Please see exam on admit note. General: Well developed, well nourished.  Pupils: Normal at 3mm Respiratory: Breathing is unlabored.  Cardiovascular: No edema.  Language: No anomia, no aphasia Muscle strength and tone-pt moving all extremities.  Gait not assessed as pt remained in bed.  Neuro: Facial muscles are symmetric. Pt without tremor, no evidence of hyperarousal.  Review of Systems  Constitutional: Negative.   HENT: Negative.    Eyes: Negative.   Respiratory: Negative.    Cardiovascular: Negative.   Gastrointestinal: Negative.   Genitourinary: Negative.   Musculoskeletal: Negative.   Skin: Negative.   Neurological: Negative.   Endo/Heme/Allergies: Negative.   Psychiatric/Behavioral:  Positive for depression, substance abuse and suicidal ideas. The patient is nervous/anxious and has insomnia.    Blood pressure 103/75, pulse 71, temperature 98.2 F (36.8 C), temperature source Oral, resp. rate 17, SpO2 97%. There is no height or weight on file to calculate BMI.  Treatment Plan Summary: ASSESSMENT: Principal Problem:   MDD (major depressive disorder), recurrent, severe, with psychosis (HCC) Active Problems:   Alcohol dependence (HCC)   Patient presenting with worsening depression and suicidal ideation. Previous suicidal gestures of holding a gun to his head. Reports thoughts of shooting himself the past week and reports SI with plan to shoot himself today but would like to get into  inpatient rehab. Detoxing from alcohol on librium  taper currently.  Denies access to gun.  Patient reports he started drinking since April due to multiple psychosocial stressors. Patient has recently been experiencing  AH of derogatory voices since April as well as recurring this past week. Previously responded well to Abilify  and is interested in restarting. Patient at high acute risk for suicide.  8/23: patient reports SI without a plan and depression. Improving sleep and appetite. Does appear significantly depressed. AH and paranoia are resolved. Patient is tolerating medications well today. Patient at high acute risk for suicide. History of suicidal gesture of holding a gun to his head. Interested in inpatient rehab and completing Librium  taper.  8/24: Patient presents significantly depressed with impaired concentration, anxiety and negative ruminations, hopelessness. Denies SI today and wants to get into inpatient rehab.Patient agreeable to starting naltrexone  for alcohol cravings and alcohol use disorder.  History of suicidal gesture of holding a gun to his head. Interested in inpatient rehab and completing Librium  taper. Denies access to a gun at this time.    Treatment Plan Summary: Daily contact with patient to assess and evaluate symptoms and progress in treatment, Medication management, as below   Observation Level/Precautions:  15 minute checks  Laboratory:  CBC Chemistry Profile Folic Acid  HbAIC UDS UA Vitamin B-12  Psychotherapy:    Medications:  Zoloft , hydroxyzine , trazodone   Consultations:    Discharge Concerns:  rehab referrals  Estimated LOS:4-6 days  Other:      Safety and Monitoring: voluntarily admission to inpatient psychiatric unit for safety, stabilization and treatment Daily contact with patient to assess and evaluate symptoms and progress in treatment Patient's case to be discussed in multi-disciplinary team meeting Observation Level : q15 minute checks Vital signs: q12 hours Precautions: suicide, elopement, and assault    2. Psychiatric Problems #MDD, severe recurrent with psychotic features Cont sertraline  to 50 mg qdaily -cont Abilify  5 mg qdaily for mood stabilization, agitation and reported auditory hallucinations  cont trazodone  50 mg at bedtime for insomnia   #alcohol use disorder severe - continue Librium  taper - maintain on CIWA protocol + PRN Librium  -restard Naltrexone  50 mg qdaily if above medications are well tolerated     3. Medical Management Covid negative CMP: wnl CBC: unremarkable EtOH: <10 UDS: wnl TSH: ordered AM draw A1C: ordered AM draw Lipids: ordered AM draw     #Dm2 Cont  metformin  500 mg qdaily   #HLD,  cont simvastatin  10 mg qdaily   #Dispo: inpatient rehab once patient has finished alcohol detox   Physician Treatment Plan for Primary Diagnosis: MDD (major depressive disorder), recurrent, severe, with psychosis (HCC) Long Term Goal(s): Improvement in symptoms so as ready for discharge   Short Term Goals: Ability to identify changes in lifestyle to reduce recurrence of condition will improve, Ability to verbalize feelings will improve, Ability to disclose and discuss suicidal ideas, Ability to demonstrate self-control will improve, Ability to identify and develop effective coping behaviors will improve, Ability to maintain clinical measurements within normal limits will improve, Compliance with prescribed medications will improve, and Ability to identify triggers associated with substance abuse/mental health issues will improve    I certify that inpatient services furnished can reasonably be expected to improve the patient's condition.      Dilana Mcphie, MD 07/14/2024 12:28 PM

## 2024-07-14 NOTE — Group Note (Signed)
 Group Topic: Recovery Basics  Group Date: 07/14/2024 Start Time: 2000 End Time: 2030 Facilitators: Verdon Jacqualyn BRAVO, NT  Department: [facility]  Number of Participants: 9  Group Focus: coping skills Treatment Modality:  Individual Therapy Interventions utilized were assignment Purpose: increase insight  Name: Delane Wessinger III Date of Birth: 1976-06-30  MR: [id number]    Level of Participation: active Quality of Participation: cooperative Interactions with others: gave feedback Mood/Affect: appropriate Triggers (if applicable): n/a Cognition: coherent/clear Progress: Moderate Response: My Recovery: relationship, isolation, not caring, being lazy are two warning signs I may be headed toward a relapse, two positive signs of recovery are not caring what people think,and not having a negative attitude towards things, two active steps, are going to get help, not caring about other peoples opinion Plan: follow-up needed  Patients Problems:  Patient Active Problem List   Diagnosis Date Noted   Alcohol abuse with intoxication, uncomplicated (HCC) 07/12/2024   Malingering 07/11/2024   MDD (major depressive disorder), recurrent severe, without psychosis (HCC) 04/25/2024   Suicidal ideation    Alcohol withdrawal without perceptual disturbances (HCC) 04/08/2021   Alcohol use disorder, severe, dependence (HCC) 04/08/2021   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

## 2024-07-15 DIAGNOSIS — F10229 Alcohol dependence with intoxication, unspecified: Secondary | ICD-10-CM | POA: Diagnosis not present

## 2024-07-15 LAB — COMPREHENSIVE METABOLIC PANEL WITH GFR
ALT: 38 U/L (ref 0–44)
AST: 32 U/L (ref 15–41)
Albumin: 2.8 g/dL — ABNORMAL LOW (ref 3.5–5.0)
Alkaline Phosphatase: 53 U/L (ref 38–126)
Anion gap: 7 (ref 5–15)
BUN: 10 mg/dL (ref 6–20)
CO2: 22 mmol/L (ref 22–32)
Calcium: 8.4 mg/dL — ABNORMAL LOW (ref 8.9–10.3)
Chloride: 108 mmol/L (ref 98–111)
Creatinine, Ser: 0.71 mg/dL (ref 0.61–1.24)
GFR, Estimated: >60 mL/min (ref >60)
Glucose, Bld: 90 mg/dL (ref 70–99)
Potassium: 4.7 mmol/L (ref 3.5–5.1)
Sodium: 137 mmol/L (ref 135–145)
Total Bilirubin: 0.4 mg/dL (ref 0.0–1.2)
Total Protein: 6.4 g/dL — ABNORMAL LOW (ref 6.5–8.1)

## 2024-07-15 NOTE — Discharge Planning (Signed)
 SW spoke with patient regarding need for pre screen phone call to be made to [facility] and provided the number and he called for pre screen and is in clinical review.   Patient spoke with SW about the [facility] in [place]  and stated he just called them and they would be coming to get him tomorrow. SW provided him with my number to contact me. He stated they were setting up [facility] or arranging pick up and would let him know in an hour a time for tomorrow. Patient will give SW the contact name to coordinate with. Will continue to follow.

## 2024-07-15 NOTE — ED Notes (Signed)
 Pt observed lying in bed. Eyes closed respirations even and non labored. NAD q 15 minute observations continue for safety.

## 2024-07-15 NOTE — ED Notes (Signed)
 Pt is excited that he will be going to residential treatment tomorrow.   Calm and cooperative.  Librium  taper continues. Q 15 minute observations continue

## 2024-07-15 NOTE — ED Notes (Signed)
 Patient is in the dayroom with other patients watching TV. NAD.  Respirations even. Denies SI/HI/AVH. Stated feeling much better today.

## 2024-07-15 NOTE — ED Notes (Signed)
Patient asleep, NAD 

## 2024-07-15 NOTE — ED Provider Notes (Signed)
 Behavioral Health Progress Note  Date and Time: 07/15/2024 3:38 PM Name: Aaron [name] MRN:  [id number]  HPI: Aaron [name] is a 48 y.o. male is a 48 year old male with psychiatric history of MDD with psychosis, Anxiety, malingering, alcohol use disorder, severe ETOHdependence, suicidal ideations, who presented voluntarily as a walk-in to [facility] with complaints of worsening depression, SI, with a plan to shoot himself and HI without plan/intent. Patient as admitted to the [facility] in 04/2024, and did well on combination of Zoloft  and Abilify  and has been tolerating these medications here at the [facility].   Patient assessment:  Pt presents today with a flat affect and depressed mood, his attention to personal hygiene and grooming is fair, eye contact is good, speech is clear & coherent. Thought contents are organized and logical, and pt currently denies SI/HI/AVH or paranoia. There is no evidence of delusional thoughts. There are no overt signs of psychosis, and he does not seem to be responding to any internal or external stimuli.   Patient repeatedly stated I need help during the entire assessment.  Verbalized his fear of what might happen to him if discharged prior to going to rehab, reported feeling fearful to his life of substance abuse, seems to be motivated to get help this time around.  He persistently talked about wanting to go to rehabilitation, stated that he wants to get better, wants to change his life around, etc. States that he went to rehab prior in [place]. Reports anxiety as 7, 10 being the worst, rates his depression as 7, 10 being worst.  Reports energy level to be normal, appetite is fair, denies being in any physical pain today, denies cravings for alcohol. CIWAs have been zero persistently since 8/23. Will discontinue them at this time. Librium  taper was completed earlier today morning. Patient denied any concerns regarding medications, denies side  effects.  Tentative discharge plan for patient is 8/27, as he seems to be motivated to get treatment and to going rehabilitation, and we will plan the next few days for CSW to work with him to enhance that plan. Continuing medications at this time with no changes.  Labs reviewed: No new orders placed.  Diagnosis:  Final diagnoses:  Alcohol abuse with intoxication, uncomplicated (HCC)  Suicidal ideations  Homelessness  Alcohol-induced mood disorder with depressive symptoms (HCC)  MDD (major depressive disorder), recurrent, severe, with psychosis (HCC)    Total Time spent with patient:45 minutes  Past Psychiatric History:  Previous Psych Diagnoses:  alcohol use disorder, depression Prior inpatient treatment: reports 6 admissions throughout his life, was admitted to [facility] last year fo Current/prior outpatient treatment: poor outpatient followup Prior rehab hx: denies Psychotherapy hx: denies History of suicide: denies but had suicidal gestures of holding a gun to his head in the past but no longer has access to gun History of homicide: denies Psychiatric medication history: Zoloft , Abilify , naltrexone  Psychiatric medication compliance history: poor  Substance Abuse Hx: Alcohol: patient with 4 DUIS, states he has had blackouts previously, patient states for the past week he had been drinking 8 shots a day, multiple drinks at the bar Tobacco: reports 2-3 cigarettes a day, declines patch  Illicit drugs: denies Rx drug abuse:  denies Rehab hx:  denies   Past Medical History: Medical Diagnoses: DM2, HLD Head trauma, LOC, concussions, seizures: denies Allergies: NKDA    Family History: Psych: denies Psych Rx: denies SA/HA: denies  Social History: Patient has a daughter in [place], [place] .  Patient's mother is in [place]  Additional Social History:   2.  Notes Sleep: Poor  Appetite:  Poor  Current Medications:  Current Facility-Administered Medications  Medication Dose  Route Frequency Provider Last Rate Last Admin   acetaminophen  (TYLENOL ) tablet 650 mg  650 mg Oral Q6H PRN Onuoha, Chinwendu V, NP   650 mg at 07/12/24 0208   alum & mag hydroxide-simeth (MAALOX/MYLANTA) 200-200-20 MG/5ML suspension 30 mL  30 mL Oral Q4H PRN Onuoha, Chinwendu V, NP       ARIPiprazole  (ABILIFY ) tablet 5 mg  5 mg Oral QHS Zouev, Dmitri, MD   5 mg at 07/14/24 2119   haloperidol  (HALDOL ) tablet 5 mg  5 mg Oral TID PRN Onuoha, Chinwendu V, NP       And   diphenhydrAMINE  (BENADRYL ) capsule 50 mg  50 mg Oral TID PRN Onuoha, Chinwendu V, NP       haloperidol  lactate (HALDOL ) injection 5 mg  5 mg Intramuscular TID PRN Onuoha, Chinwendu V, NP       And   diphenhydrAMINE  (BENADRYL ) injection 50 mg  50 mg Intramuscular TID PRN Onuoha, Chinwendu V, NP       And   LORazepam  (ATIVAN ) injection 2 mg  2 mg Intramuscular TID PRN Onuoha, Chinwendu V, NP       haloperidol  lactate (HALDOL ) injection 10 mg  10 mg Intramuscular TID PRN Onuoha, Chinwendu V, NP       And   diphenhydrAMINE  (BENADRYL ) injection 50 mg  50 mg Intramuscular TID PRN Onuoha, Chinwendu V, NP       And   LORazepam  (ATIVAN ) injection 2 mg  2 mg Intramuscular TID PRN Onuoha, Chinwendu V, NP       magnesium  hydroxide (MILK OF MAGNESIA) suspension 30 mL  30 mL Oral Daily PRN Onuoha, Chinwendu V, NP       metFORMIN  (GLUCOPHAGE -XR) 24 hr tablet 500 mg  500 mg Oral Q breakfast Zouev, Dmitri, MD   500 mg at 07/15/24 9062   multivitamin with minerals tablet 1 tablet  1 tablet Oral Daily Onuoha, Chinwendu V, NP   1 tablet at 07/15/24 9062   naltrexone  (DEPADE) tablet 50 mg  50 mg Oral Daily Zouev, Dmitri, MD   50 mg at 07/15/24 9062   sertraline  (ZOLOFT ) tablet 50 mg  50 mg Oral QHS Zouev, Dmitri, MD   50 mg at 07/14/24 2119   simvastatin  (ZOCOR ) tablet 10 mg  10 mg Oral QHS Zouev, Dmitri, MD   10 mg at 07/14/24 2119   traZODone  (DESYREL ) tablet 50 mg  50 mg Oral QHS Zouev, Dmitri, MD   50 mg at 07/14/24 2119   Current Outpatient  Medications  Medication Sig Dispense Refill   ARIPiprazole  (ABILIFY ) 15 MG tablet Take 1 tablet (15 mg total) by mouth daily. (Patient not taking: Reported on 07/11/2024) 30 tablet 0   doxycycline  (VIBRAMYCIN ) 100 MG capsule Take 1 capsule (100 mg total) by mouth 2 (two) times daily. (Patient not taking: Reported on 07/11/2024) 20 capsule 0   metFORMIN  (GLUCOPHAGE -XR) 500 MG 24 hr tablet Take 1 tablet (500 mg total) by mouth daily with breakfast. (Patient not taking: Reported on 07/11/2024) 30 tablet 0   naltrexone  (DEPADE) 50 MG tablet Take 1 tablet (50 mg total) by mouth daily. (Patient not taking: Reported on 07/11/2024) 30 tablet 0   sertraline  (ZOLOFT ) 100 MG tablet Take 1 tablet (100 mg total) by mouth at bedtime. (Patient not taking: Reported on 07/11/2024) 30 tablet 0  simvastatin  (ZOCOR ) 10 MG tablet Take 1 tablet (10 mg total) by mouth at bedtime. (Patient not taking: Reported on 07/11/2024) 30 tablet 0   traZODone  (DESYREL ) 100 MG tablet Take 2 tablets (200 mg total) by mouth at bedtime as needed for sleep (insomnia). (Patient not taking: Reported on 07/11/2024) 60 tablet 0    Labs  Lab Results:  Admission on 07/12/2024  Component Date Value Ref Range Status   Alcohol, Ethyl (B) 07/12/2024 143 (H)  <15 mg/dL Final   Comment: (NOTE) For medical purposes only. Performed at [facility], [address] [place], [place] [address]    POC Amphetamine UR 07/12/2024 None Detected  NONE DETECTED (Cut Off Level 1000 ng/mL) Final   POC Secobarbital (BAR) 07/12/2024 None Detected  NONE DETECTED (Cut Off Level 300 ng/mL) Final   POC Buprenorphine (BUP) 07/12/2024 None Detected  NONE DETECTED (Cut Off Level 10 ng/mL) Final   POC Oxazepam (BZO) 07/12/2024 None Detected  NONE DETECTED (Cut Off Level 300 ng/mL) Final   POC Cocaine UR 07/12/2024 None Detected  NONE DETECTED (Cut Off Level 300 ng/mL) Final   POC Methamphetamine UR 07/12/2024 None Detected  NONE DETECTED (Cut Off Level 1000 ng/mL)  Final   POC Morphine 07/12/2024 None Detected  NONE DETECTED (Cut Off Level 300 ng/mL) Final   POC Methadone UR 07/12/2024 None Detected  NONE DETECTED (Cut Off Level 300 ng/mL) Final   POC Oxycodone UR 07/12/2024 None Detected  NONE DETECTED (Cut Off Level 100 ng/mL) Final   POC Marijuana UR 07/12/2024 None Detected  NONE DETECTED (Cut Off Level 50 ng/mL) Final   Sodium 07/15/2024 137  135 - 145 mmol/L Final   Potassium 07/15/2024 4.7  3.5 - 5.1 mmol/L Final   Chloride 07/15/2024 108  98 - 111 mmol/L Final   CO2 07/15/2024 22  22 - 32 mmol/L Final   Glucose, Bld 07/15/2024 90  70 - 99 mg/dL Final   Glucose reference range applies only to samples taken after fasting for at least 8 hours.   BUN 07/15/2024 10  6 - 20 mg/dL Final   Creatinine, Ser 07/15/2024 0.71  0.61 - 1.24 mg/dL Final   Calcium  07/15/2024 8.4 (L)  8.9 - 10.3 mg/dL Final   Total Protein 91/74/7974 6.4 (L)  6.5 - 8.1 g/dL Final   Albumin 91/74/7974 2.8 (L)  3.5 - 5.0 g/dL Final   AST 91/74/7974 32  15 - 41 U/L Final   ALT 07/15/2024 38  0 - 44 U/L Final   Alkaline Phosphatase 07/15/2024 53  38 - 126 U/L Final   Total Bilirubin 07/15/2024 0.4  0.0 - 1.2 mg/dL Final   GFR, Estimated 07/15/2024 >60  >60 mL/min Final   Comment: (NOTE) Calculated using the CKD-EPI Creatinine Equation (2021)    Anion gap 07/15/2024 7  5 - 15 Final   Performed at [facility], [address] [place], [place] [address]  Admission on 07/10/2024, Discharged on 07/11/2024  Component Date Value Ref Range Status   Sodium 07/10/2024 140  135 - 145 mmol/L Final   Potassium 07/10/2024 3.6  3.5 - 5.1 mmol/L Final   Chloride 07/10/2024 105  98 - 111 mmol/L Final   CO2 07/10/2024 20 (L)  22 - 32 mmol/L Final   Glucose, Bld 07/10/2024 98  70 - 99 mg/dL Final   Glucose reference range applies only to samples taken after fasting for at least 8 hours.   BUN 07/10/2024 9  6 -  20 mg/dL Final   Creatinine, Ser 07/10/2024 0.63  0.61 - 1.24 mg/dL Final    Calcium  07/10/2024 8.9  8.9 - 10.3 mg/dL Final   Total Protein 91/79/7974 7.3  6.5 - 8.1 g/dL Final   Albumin 91/79/7974 3.7  3.5 - 5.0 g/dL Final   AST 91/79/7974 28  15 - 41 U/L Final   ALT 07/10/2024 49 (H)  0 - 44 U/L Final   Alkaline Phosphatase 07/10/2024 65  38 - 126 U/L Final   Total Bilirubin 07/10/2024 0.6  0.0 - 1.2 mg/dL Final   GFR, Estimated 07/10/2024 >60  >60 mL/min Final   Comment: (NOTE) Calculated using the CKD-EPI Creatinine Equation (2021)    Anion gap 07/10/2024 15  5 - 15 Final   Performed at [facility], [address] [place], [place] [address]   Alcohol, Ethyl (B) 07/10/2024 106 (H)  <15 mg/dL Final   Comment: (NOTE) For medical purposes only. Performed at [facility], [address], [place], [place] [address]    WBC 07/10/2024 6.4  4.0 - 10.5 K/uL Final   RBC 07/10/2024 4.85  4.22 - 5.81 MIL/uL Final   Hemoglobin 07/10/2024 14.1  13.0 - 17.0 g/dL Final   HCT 91/79/7974 43.2  39.0 - 52.0 % Final   MCV 07/10/2024 89.1  80.0 - 100.0 fL Final   MCH 07/10/2024 29.1  26.0 - 34.0 pg Final   MCHC 07/10/2024 32.6  30.0 - 36.0 g/dL Final   RDW 91/79/7974 14.0  11.5 - 15.5 % Final   Platelets 07/10/2024 368  150 - 400 K/uL Final   nRBC 07/10/2024 0.0  0.0 - 0.2 % Final   Performed at [facility], [address] [place], [place] [address]   Opiates 07/10/2024 NONE DETECTED  NONE DETECTED Final   Cocaine 07/10/2024 NONE DETECTED  NONE DETECTED Final   Benzodiazepines 07/10/2024 NONE DETECTED  NONE DETECTED Final   Amphetamines 07/10/2024 NONE DETECTED  NONE DETECTED Final   Tetrahydrocannabinol 07/10/2024 NONE DETECTED  NONE DETECTED Final   Barbiturates 07/10/2024 NONE DETECTED  NONE DETECTED Final   Comment: (NOTE) DRUG SCREEN FOR MEDICAL PURPOSES ONLY.  IF CONFIRMATION IS NEEDED FOR ANY PURPOSE, NOTIFY LAB WITHIN 5 DAYS.  LOWEST DETECTABLE LIMITS FOR URINE DRUG SCREEN Drug Class                     Cutoff (ng/mL) Amphetamine and  metabolites    1000 Barbiturate and metabolites    200 Benzodiazepine                 200 Opiates and metabolites        300 Cocaine and metabolites        300 THC                            50 Performed at [facility], [address] [place], [place] [address]   Admission on 04/25/2024, Discharged on 05/01/2024  Component Date Value Ref Range Status   Hgb A1c MFr Bld 04/27/2024 5.6  4.8 - 5.6 % Final   Comment: (NOTE)         Prediabetes: 5.7 - 6.4         Diabetes: >6.4         Glycemic control for adults with diabetes: <7.0    Mean Plasma Glucose 04/27/2024 114  mg/dL Final   Comment: (  NOTE) Performed At: [facility] [address] [place], [place] [id number] Jennette Shorter MD Ey:[id number]    Cholesterol 04/27/2024 204 (H)  0 - 200 mg/dL Final   Triglycerides 93/92/7974 242 (H)  <150 mg/dL Final   HDL 93/92/7974 34 (L)  >40 mg/dL Final   Total CHOL/HDL Ratio 04/27/2024 6.0  RATIO Final   VLDL 04/27/2024 48 (H)  0 - 40 mg/dL Final   LDL Cholesterol 04/27/2024 122 (H)  0 - 99 mg/dL Final   Comment:        Total Cholesterol/HDL:CHD Risk Coronary Heart Disease Risk Table                     Men   Women  1/2 Average Risk   3.4   3.3  Average Risk       5.0   4.4  2 X Average Risk   9.6   7.1  3 X Average Risk  23.4   11.0        Use the calculated Patient Ratio above and the CHD Risk Table to determine the patient's CHD Risk.        ATP [name] CLASSIFICATION (LDL):  <100     mg/dL   Optimal  899-870  mg/dL   Near or Above                    Optimal  130-159  mg/dL   Borderline  839-810  mg/dL   High  >809     mg/dL   Very High Performed at [facility], [address], [place], [place] [address]    RPR Ser Ql 04/27/2024 NON REACTIVE  NON REACTIVE Final   Performed at [facility], [address] [place], [place] [address]   TSH 04/27/2024 1.247  0.350 - 4.500 uIU/mL Final   Comment: Performed by a 3rd Generation assay with a  functional sensitivity of <=0.01 uIU/mL. Performed at [facility], [address], [place], [place] [address]    HIV Screen 4th Generation wRfx 04/27/2024 Non Reactive  Non Reactive Final   Performed at [facility], [address], [place], [place] [address]  Admission on 04/24/2024, Discharged on 04/25/2024  Component Date Value Ref Range Status   Sodium 04/24/2024 140  135 - 145 mmol/L Final   Potassium 04/24/2024 3.7  3.5 - 5.1 mmol/L Final   Chloride 04/24/2024 102  98 - 111 mmol/L Final   CO2 04/24/2024 23  22 - 32 mmol/L Final   Glucose, Bld 04/24/2024 94  70 - 99 mg/dL Final   Glucose reference range applies only to samples taken after fasting for at least 8 hours.   BUN 04/24/2024 15  6 - 20 mg/dL Final   Creatinine, Ser 04/24/2024 0.97  0.61 - 1.24 mg/dL Final   Calcium  04/24/2024 9.2  8.9 - 10.3 mg/dL Final   Total Protein 93/95/7974 7.6  6.5 - 8.1 g/dL Final   Albumin 93/95/7974 3.7  3.5 - 5.0 g/dL Final   AST 93/95/7974 36  15 - 41 U/L Final   ALT 04/24/2024 44  0 - 44 U/L Final   Alkaline Phosphatase 04/24/2024 56  38 - 126 U/L Final   Total Bilirubin 04/24/2024 0.6  0.0 - 1.2 mg/dL Final   GFR, Estimated 04/24/2024 >60  >60 mL/min Final   Comment: (NOTE) Calculated using the CKD-EPI Creatinine Equation (2021)    Anion gap 04/24/2024 15  5 - 15 Final   Performed at [facility], [address], [place], [place] [address]   Alcohol, Ethyl (B) 04/24/2024 179 (H)  <15 mg/dL Final   Comment: (NOTE) For medical purposes only. Performed at [facility], [address] [place], [place] [address]    WBC 04/24/2024 7.2  4.0 - 10.5 K/uL Final   RBC 04/24/2024 4.80  4.22 - 5.81 MIL/uL Final   Hemoglobin 04/24/2024 13.4  13.0 - 17.0 g/dL Final   HCT 93/95/7974 41.8  39.0 - 52.0 % Final   MCV 04/24/2024 87.1  80.0 - 100.0 fL Final   MCH 04/24/2024 27.9  26.0 - 34.0 pg Final   MCHC 04/24/2024 32.1  30.0 - 36.0 g/dL Final   RDW 93/95/7974  14.6  11.5 - 15.5 % Final   Platelets 04/24/2024 370  150 - 400 K/uL Final   nRBC 04/24/2024 0.0  0.0 - 0.2 % Final   Performed at [facility], [address] [place], [place] [address]  Admission on 04/20/2024, Discharged on 04/21/2024  Component Date Value Ref Range Status   Sodium 04/20/2024 137  135 - 145 mmol/L Final   Potassium 04/20/2024 3.2 (L)  3.5 - 5.1 mmol/L Final   Chloride 04/20/2024 101  98 - 111 mmol/L Final   CO2 04/20/2024 23  22 - 32 mmol/L Final   Glucose, Bld 04/20/2024 100 (H)  70 - 99 mg/dL Final   Glucose reference range applies only to samples taken after fasting for at least 8 hours.   BUN 04/20/2024 14  6 - 20 mg/dL Final   Creatinine, Ser 04/20/2024 1.08  0.61 - 1.24 mg/dL Final   Calcium  04/20/2024 8.6 (L)  8.9 - 10.3 mg/dL Final   GFR, Estimated 04/20/2024 >60  >60 mL/min Final   Comment: (NOTE) Calculated using the CKD-EPI Creatinine Equation (2021)    Anion gap 04/20/2024 13  5 - 15 Final   Performed at [facility], [address] [place], [place] [address]   WBC 04/20/2024 8.7  4.0 - 10.5 K/uL Final   RBC 04/20/2024 5.02  4.22 - 5.81 MIL/uL Final   Hemoglobin 04/20/2024 14.2  13.0 - 17.0 g/dL Final   HCT 94/68/7974 44.6  39.0 - 52.0 % Final   MCV 04/20/2024 88.8  80.0 - 100.0 fL Final   MCH 04/20/2024 28.3  26.0 - 34.0 pg Final   MCHC 04/20/2024 31.8  30.0 - 36.0 g/dL Final   RDW 94/68/7974 15.1  11.5 - 15.5 % Final   Platelets 04/20/2024 417 (H)  150 - 400 K/uL Final   nRBC 04/20/2024 0.0  0.0 - 0.2 % Final   Performed at [facility], [address], [place], [place] [address]   Opiates 04/20/2024 NONE DETECTED  NONE DETECTED Final   Cocaine 04/20/2024 NONE DETECTED  NONE DETECTED Final   Benzodiazepines 04/20/2024 NONE DETECTED  NONE DETECTED Final   Amphetamines 04/20/2024 NONE DETECTED  NONE DETECTED Final   Tetrahydrocannabinol 04/20/2024 NONE DETECTED  NONE DETECTED Final   Barbiturates  04/20/2024 NONE DETECTED  NONE DETECTED Final   Comment: (NOTE) DRUG SCREEN FOR MEDICAL PURPOSES ONLY.  IF CONFIRMATION IS NEEDED FOR ANY PURPOSE, NOTIFY LAB WITHIN 5 DAYS.  LOWEST DETECTABLE LIMITS FOR URINE DRUG SCREEN Drug Class                     Cutoff (ng/mL) Amphetamine and metabolites    1000  Barbiturate and metabolites    200 Benzodiazepine                 200 Opiates and metabolites        300 Cocaine and metabolites        300 THC                            50 Performed at [facility], [address] [place], [place] [address]    Alcohol, Ethyl (B) 04/20/2024 130 (H)  <15 mg/dL Final   Comment: (NOTE) For medical purposes only. Performed at [facility], [address], [place], [place] [address]     Blood Alcohol level:  Lab Results  Component Value Date   ETH 143 (H) 07/12/2024   ETH 106 (H) 07/10/2024    Metabolic Disorder Labs: Lab Results  Component Value Date   HGBA1C 5.6 04/27/2024   MPG 114 04/27/2024   MPG 122.63 10/10/2023   No results found for: PROLACTIN Lab Results  Component Value Date   CHOL 204 (H) 04/27/2024   TRIG 242 (H) 04/27/2024   HDL 34 (L) 04/27/2024   CHOLHDL 6.0 04/27/2024   VLDL 48 (H) 04/27/2024   LDLCALC 122 (H) 04/27/2024   LDLCALC 81 10/07/2023    Therapeutic Lab Levels: No results found for: LITHIUM No results found for: VALPROATE No results found for: CBMZ  Physical Findings   AIMS    Flowsheet Row [facility]) [facility]) [facility]  AIMS Total Score 0 0   AUDIT    Flowsheet Row ED from 07/12/2024 in [facility] Admission (Discharged) [facility] Admission (Discharged) [facility] Admission  (Discharged) [facility]  Alcohol Use Disorder Identification Test Final Score (AUDIT) 17 20 30 27    PHQ2-9    Flowsheet Row ED from 07/12/2024 in [facility] ED from 10/07/2023 in [facility]  PHQ-2 Total Score 4 0  PHQ-9 Total Score 18 10   Flowsheet Row ED from 07/12/2024 in [facility] ED from 07/11/2024 in [facility] ED from 07/10/2024 in [facility] Department at [facility]  C-SSRS RISK CATEGORY High Risk High Risk High Risk     Musculoskeletal  Strength & Muscle Tone: within normal limits Gait & Station: normal Patient leans: N/A  Psychiatric Specialty Exam  Presentation  General Appearance:  Appropriate for Environment; Fairly Groomed  Eye Contact: Fair  Speech: slow  Speech Volume: Normal  Handedness: Right   Mood and Affect  Mood: Depressed  Affect: Congruent   Thought Process  Thought Processes: Coherent  Descriptions of Associations:Intact  Orientation:Full (Time, Place and Person)  Thought Content:Logical  Diagnosis of Schizophrenia or Schizoaffective disorder in past: No  Duration of Psychotic Symptoms: Less than six months   Hallucinations:denies  Ideas of Reference:None  Suicidal Thoughts: SI without a plan or intent  Homicidal Thoughts:denies   Sensorium  Memory: Immediate Fair  Judgment: Fair  Insight: Fair   Art therapist  Concentration: Fair  Attention Span: Fair  Recall: [employer] of Knowledge: Fair  Language: Fair   Psychomotor Activity  Psychomotor Activity: slowed   Assets  Assets: Resilience   Sleep  Sleep: good   Physical Exam  Physical exam: Please see exam on admit note. General: Well developed, well nourished.  Pupils: Normal at 3mm Respiratory: Breathing is unlabored.  Cardiovascular: No  edema.  Language: No anomia, no aphasia Muscle strength and tone-pt moving all extremities.  Gait not assessed as pt remained in bed.  Neuro: Facial muscles are symmetric. Pt without tremor, no evidence of hyperarousal.  Review of Systems  Constitutional: Negative.   HENT: Negative.    Eyes: Negative.   Respiratory: Negative.    Cardiovascular: Negative.   Gastrointestinal: Negative.   Genitourinary: Negative.   Musculoskeletal: Negative.   Skin: Negative.   Neurological: Negative.   Endo/Heme/Allergies: Negative.   Psychiatric/Behavioral:  Positive for depression and substance abuse. Negative for hallucinations, memory loss and suicidal ideas. The patient has insomnia. The patient is not nervous/anxious.    Blood pressure 99/75, pulse 72, temperature 97.8 F (36.6 C), temperature source Oral, resp. rate 17, SpO2 99%. There is no height or weight on file to calculate BMI.   Treatment Plan Summary: Daily contact with patient to assess and evaluate symptoms and progress in treatment, Medication management, as below   Safety and Monitoring: voluntarily admission to inpatient psychiatric unit for safety, stabilization and treatment Daily contact with patient to assess and evaluate symptoms and progress in treatment Patient's case to be discussed in multi-disciplinary team meeting Observation Level : q15 minute checks Vital signs: q12 hours Precautions: suicide, elopement, and assault   2. Psychiatric Problems #MDD, severe recurrent with psychotic features Continue sertraline  to 50 mg qdaily -Continue Abilify  5 mg qdaily for mood stabilization, agitation and reported auditory hallucinations  -Continue Trazodone  50 mg at bedtime for insomnia   #alcohol use disorder severe - Librium  taper completed (08/25) -CIWA dc'd -Continue Naltrexone  50 mg daily for alcohol cravings    3. Medical Management Covid negative CMP: wnl CBC: unremarkable EtOH: <10 UDS: wnl TSH: ordered AM  draw A1C: ordered AM draw Lipids: ordered AM draw    #Dm2 Cont  metformin  500 mg qdaily   #HLD,  cont simvastatin  10 mg qdaily   #Dispo: inpatient rehab once patient has finished alcohol detox   Physician Treatment Plan for Primary Diagnosis: MDD (major depressive disorder), recurrent, severe, with psychosis (HCC) Long Term Goal(s): Improvement in symptoms so as ready for discharge   Short Term Goals: Ability to identify changes in lifestyle to reduce recurrence of condition will improve, Ability to verbalize feelings will improve, Ability to disclose and discuss suicidal ideas, Ability to demonstrate self-control will improve, Ability to identify and develop effective coping behaviors will improve, Ability to maintain clinical measurements within normal limits will improve, Compliance with prescribed medications will improve, and Ability to identify triggers associated with substance abuse/mental health issues will improve   I certify that inpatient services furnished can reasonably be expected to improve the patient's condition.   Total Time Spent in Direct Patient Care:  I personally spent 45 minutes on the unit in direct patient care. The direct patient care time included face-to-face time with the patient, reviewing the patient's chart, communicating with other professionals, and coordinating care. Greater than 50% of this time was spent in counseling or coordinating care with the patient regarding goals of hospitalization, psycho-education, and discharge planning needs.    Donia Snell, NP 07/15/2024 3:38 PM

## 2024-07-15 NOTE — Group Note (Signed)
 Group Topic: Relaxation  Group Date: 07/15/2024 Start Time: 1300 End Time: 1345 Facilitators: Andrew Royetta Pimenta  Department: [facility]  Number of Participants: 2  Group Focus: relaxation Treatment Modality:  Psychoeducation Interventions utilized were patient education Purpose: increase insight  Name: Aaron [name] Date of Birth: 1976/03/20  MR: [id number]    Level of Participation: Patient did not attend group Quality of Participation: N/A Interactions with others: N/A Mood/Affect: N/A Triggers (if applicable): N/A Cognition: N/A Progress: None Response: N/A Plan: follow-up needed  Patients Problems:  Patient Active Problem List   Diagnosis Date Noted   Alcohol abuse with intoxication, uncomplicated (HCC) 07/12/2024   Malingering 07/11/2024   MDD (major depressive disorder), recurrent severe, without psychosis (HCC) 04/25/2024   Suicidal ideation    Alcohol withdrawal without perceptual disturbances (HCC) 04/08/2021   Alcohol use disorder, severe, dependence (HCC) 04/08/2021   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

## 2024-07-15 NOTE — ED Notes (Signed)
 Patient was provided breakfast

## 2024-07-15 NOTE — ED Notes (Signed)
 Patient asleep at this time, CIWA assessments could not be completed.

## 2024-07-16 DIAGNOSIS — Z5900 Homelessness unspecified: Secondary | ICD-10-CM | POA: Diagnosis not present

## 2024-07-16 DIAGNOSIS — F10120 Alcohol abuse with intoxication, uncomplicated: Secondary | ICD-10-CM | POA: Diagnosis not present

## 2024-07-16 DIAGNOSIS — F333 Major depressive disorder, recurrent, severe with psychotic symptoms: Secondary | ICD-10-CM | POA: Diagnosis not present

## 2024-07-16 MED ORDER — ADULT MULTIVITAMIN W/MINERALS CH: 1.0000 | ORAL_TABLET | Freq: Every day | ORAL | 0 refills | Status: AC

## 2024-07-16 MED ORDER — ARIPIPRAZOLE 5 MG PO TABS: 5.0000 mg | ORAL_TABLET | Freq: Every day | ORAL | 0 refills | Status: AC

## 2024-07-16 MED ORDER — METFORMIN HCL ER 500 MG PO TB24: 500.0000 mg | ORAL_TABLET | Freq: Every day | ORAL | 0 refills | Status: AC

## 2024-07-16 MED ORDER — SERTRALINE HCL 50 MG PO TABS: 50.0000 mg | ORAL_TABLET | Freq: Every day | ORAL | 0 refills | Status: AC

## 2024-07-16 MED ORDER — NALTREXONE HCL 50 MG PO TABS: 50.0000 mg | ORAL_TABLET | Freq: Every day | ORAL | 0 refills | Status: AC

## 2024-07-16 MED ORDER — SIMVASTATIN 10 MG PO TABS: 10.0000 mg | ORAL_TABLET | Freq: Every day | ORAL | 0 refills | Status: AC

## 2024-07-16 MED ORDER — TRAZODONE HCL 50 MG PO TABS: 50.0000 mg | ORAL_TABLET | Freq: Every day | ORAL | 0 refills | Status: AC

## 2024-07-16 NOTE — Progress Notes (Signed)
   07/16/24 1603  Spiritual Encounters  Type of Visit Follow up  Care provided to: Patient  Referral source Chaplain assessment  Reason for visit Routine spiritual support   While rounding on unit, engaged Mr. Oehlert prior to discharge.  He shared hopes for sobriety and optimistic about change of location.,  This chaplain provided relational support and active listening. Offered words of encouragement, drawing upon Mr. Geiger own words from Friday group, focusing on possibilities.  Maiah Sinning L. Fredrica, M.Div

## 2024-07-16 NOTE — ED Notes (Signed)
 Patient asleep in bed at this time.  Patient scheduled for discharge later this morning.  He will [place] going to the store [facility] in [place] .  Transportation to be arranged by Child psychotherapist.  Will monitor and provide support as needed.  Patient without distress at this time.  Will monitor.

## 2024-07-16 NOTE — Group Note (Signed)
 Group Topic: Social Support  Group Date: 07/16/2024 Start Time: 1215 End Time: 1245 Facilitators: Lonzell Dwayne RAMAN, NT  Department: [facility]  Number of Participants: 5  Group Focus: affirmation Treatment Modality:  Cognitive Behavioral Therapy Interventions utilized were reminiscence Purpose: increase insight  Name: Aaron [name] Date of Birth: 1976/09/30  MR: [id number]    Level of Participation: Patient did attend group active Quality of Participation: attentive Interactions with others: gave feedback Mood/Affect: positive Triggers (if applicable): The Wrong Crowd Cognition: coherent/clear Progress: Moderate Response: Appropriate Plan: patient will be encouraged to continue to be positive and work on getting better, never give up  Patients Problems:  Patient Active Problem List   Diagnosis Date Noted   Homelessness 07/16/2024   Alcohol abuse with intoxication, uncomplicated (HCC) 07/12/2024   Malingering 07/11/2024   MDD (major depressive disorder), recurrent severe, without psychosis (HCC) 04/25/2024   Suicidal ideation    Alcohol withdrawal without perceptual disturbances (HCC) 04/08/2021   Alcohol use disorder, severe, dependence (HCC) 04/08/2021   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

## 2024-07-16 NOTE — ED Notes (Signed)
 Patient is in the bedroom calm and sleeping. NAD. Will continue to monitor for safety.

## 2024-07-16 NOTE — Discharge Instructions (Signed)
 You are scheduled to admit to this residential substance abuse and treatment program today via [employer] bus that was arranged by the facility. Cab has been arranged to transport you to the [employer] bus station at 4:30 pick up for 5:30 departure. You will be provided with 30 day scripts of medications and will need to keep your discharge paperwork should the facility need.    [facility]  [address] [place], [place], [place] , [address] [phone]  Contact liaison: Shanda Sharps [phone]

## 2024-07-16 NOTE — Discharge Planning (Signed)
 SW spoke with Shanda of the [facility] in [place]. [place] and patient will be discharging to today. She sent patients bus ticket for pick up at [employer] bus station at 5:30 on [address] He will need to arrive by 4:30. Patient will be provided 30 day scripts and 7 day med supply. He will need a cab arranged for pick up at 4:15.

## 2024-07-16 NOTE — ED Provider Notes (Signed)
 FBC/OBS ASAP Discharge Summary  Date and Time: 07/16/2024 11:40 AM  Name: Aaron [name]  MRN:  [id number]   Discharge Diagnoses:  Final diagnoses:  Alcohol abuse with intoxication, uncomplicated (HCC)  Homelessness  Alcohol-induced mood disorder with depressive symptoms (HCC)  MDD (major depressive disorder), recurrent, severe, with psychosis (HCC)   YEP:Aaron [name] is a 48 y.o. male with psychiatric history of MDD, Anxiety, alcohol use disorder, severe ETOH dependence, who presented voluntarily to the [facility] health center with complaints of worsening depressive symptoms & SI with a plan to shoot himself as well as HI without plan/intent. Patient was last admitted to the [facility] system prior to this encounter at the behavioral health hospital on [address] in 01/[id number].  Stay Summary: Patient [facility] for treatment and stabilization. He was restarted on medications for management of mental health as well as medical conditions, and worked with the Clinical social worker to be referred into substance abuse treatment facilities. During the patient's hospitalization, patient had extensive initial psychiatric evaluation, and follow-up psychiatric evaluations every day. Psychiatric diagnoses provided upon initial assessment: As listed above.  Patient's psychiatric medications were adjusted on admission:   Home medications continued -Zoloft  25 mg Po daily fro depressive and anxiety symptoms   Prn meds -Tylenol . Maalox, MOM,  -agitation Protocol medications  During the hospitalization, other adjustments were made to the patient's psychiatric medication regimen, with discharge medications as follows: - Abilify  5 mg nightly for mood stabilization - Zoloft  50 mg daily for depressive symptoms  -naltrexone  50 mg daily for alcohol cravings - Trazodone  50 mg nightly at bedtime  Home medications will continue  as follows: - Zocor  10 mg every evening hyperlipidemia - Multivitamins daily for nutritional supplementation - Metformin  500 mg daily with breakfast for diabetes mellitus Patient provided with 30-day supply of medications prescriptions via a script for above medications, as well as a 7 day supply of medications samples provided by the Behavioral health pharmacist at [facility].  Patient's care was discussed during the interdisciplinary team meeting every day during the hospitalization. The patient denies having side effects to prescribed psychiatric medication. The patient was evaluated each day by a clinical provider to ascertain response to treatment. Improvement was noted by the patient's report of decreasing symptoms, improved sleep and appetite, affect, medication tolerance, behavior, and participation in unit programming.  Symptoms were reported as significantly decreased or resolved completely by discharge.   On day of discharge, the patient reports that their mood is stable. The patient denied having suicidal thoughts for more than 48 hours prior to discharge.  Patient denies having homicidal thoughts.  Patient denies having auditory hallucinations.  Patient denies any visual hallucinations or other symptoms of psychosis. The patient was motivated to continue taking medication with a goal of continued improvement in mental health.   The patient reports their target psychiatric symptoms of depression, anxiety, insomnia, substance use withdrawal symptoms responded well to the psychiatric medications, and the patient reports overall benefit other psychiatric hospitalization. Supportive psychotherapy was provided to the patient. The patient also participated in regular group therapy while hospitalized. Coping skills, problem solving as well as relaxation therapies were also part of the unit programming.  Labs were reviewed with the patient, and abnormal results were discussed with the patient.   The  patient is able to verbalize their individual safety plan to this provider.  # It is recommended to the patient to continue psychiatric  medications as prescribed, after discharge from the hospital.    # It is recommended to the patient to follow up with your outpatient psychiatric provider and PCP.  # It was discussed with the patient, the impact of alcohol, drugs, tobacco have been there overall psychiatric and medical wellbeing, and total abstinence from substance use was recommended the patient.ed.  # Prescriptions provided directly to the patient at discharge. Patient agreeable to discharge plan. Given opportunity to ask questions. Verbalizes feeling comfortable with discharge plan.    # Agrees that In the event of worsening symptoms, the patient is instructed to call the crisis hotline, [phone] and or go to the nearest ED for appropriate evaluation and treatment of symptoms. To follow-up with primary care provider for other medical issues, concerns and or health care needs  # Patient was discharged with a plan to follow up as agreed with CSW.   Total Time spent with patient: 45 minutes  Tobacco Cessation:  A prescription for an FDA-approved tobacco cessation medication was offered at discharge and the patient refused  Current Medications:  Current Facility-Administered Medications  Medication Dose Route Frequency Provider Last Rate Last Admin   acetaminophen  (TYLENOL ) tablet 650 mg  650 mg Oral Q6H PRN Onuoha, Chinwendu V, NP   650 mg at 07/12/24 0208   alum & mag hydroxide-simeth (MAALOX/MYLANTA) 200-200-20 MG/5ML suspension 30 mL  30 mL Oral Q4H PRN Onuoha, Chinwendu V, NP       ARIPiprazole  (ABILIFY ) tablet 5 mg  5 mg Oral QHS Zouev, Dmitri, MD   5 mg at 07/15/24 2115   haloperidol  (HALDOL ) tablet 5 mg  5 mg Oral TID PRN Onuoha, Chinwendu V, NP       And   diphenhydrAMINE  (BENADRYL ) capsule 50 mg  50 mg Oral TID PRN Onuoha, Chinwendu V, NP       haloperidol  lactate (HALDOL )  injection 5 mg  5 mg Intramuscular TID PRN Onuoha, Chinwendu V, NP       And   diphenhydrAMINE  (BENADRYL ) injection 50 mg  50 mg Intramuscular TID PRN Onuoha, Chinwendu V, NP       And   LORazepam  (ATIVAN ) injection 2 mg  2 mg Intramuscular TID PRN Onuoha, Chinwendu V, NP       haloperidol  lactate (HALDOL ) injection 10 mg  10 mg Intramuscular TID PRN Onuoha, Chinwendu V, NP       And   diphenhydrAMINE  (BENADRYL ) injection 50 mg  50 mg Intramuscular TID PRN Onuoha, Chinwendu V, NP       And   LORazepam  (ATIVAN ) injection 2 mg  2 mg Intramuscular TID PRN Onuoha, Chinwendu V, NP       magnesium  hydroxide (MILK OF MAGNESIA) suspension 30 mL  30 mL Oral Daily PRN Onuoha, Chinwendu V, NP       metFORMIN  (GLUCOPHAGE -XR) 24 hr tablet 500 mg  500 mg Oral Q breakfast Zouev, Dmitri, MD   500 mg at 07/16/24 9095   multivitamin with minerals tablet 1 tablet  1 tablet Oral Daily Onuoha, Chinwendu V, NP   1 tablet at 07/16/24 9095   naltrexone  (DEPADE) tablet 50 mg  50 mg Oral Daily Zouev, Dmitri, MD   50 mg at 07/16/24 9095   sertraline  (ZOLOFT ) tablet 50 mg  50 mg Oral QHS Zouev, Dmitri, MD   50 mg at 07/15/24 2115   simvastatin  (ZOCOR ) tablet 10 mg  10 mg Oral QHS Zouev, Dmitri, MD   10 mg at 07/15/24 2115   traZODone  (DESYREL )  tablet 50 mg  50 mg Oral QHS Zouev, Dmitri, MD   50 mg at 07/15/24 2115   Current Outpatient Medications  Medication Sig Dispense Refill   ARIPiprazole  (ABILIFY ) 5 MG tablet Take 1 tablet (5 mg total) by mouth at bedtime. 30 tablet 0   [START ON 07/17/2024] metFORMIN  (GLUCOPHAGE -XR) 500 MG 24 hr tablet Take 1 tablet (500 mg total) by mouth daily with breakfast. 30 tablet 0   [START ON 07/17/2024] Multiple Vitamin (MULTIVITAMIN WITH MINERALS) TABS tablet Take 1 tablet by mouth daily. 30 tablet 0   naltrexone  (DEPADE) 50 MG tablet Take 1 tablet (50 mg total) by mouth daily. 30 tablet 0   sertraline  (ZOLOFT ) 50 MG tablet Take 1 tablet (50 mg total) by mouth at bedtime. 30 tablet 0    simvastatin  (ZOCOR ) 10 MG tablet Take 1 tablet (10 mg total) by mouth at bedtime. 30 tablet 0   traZODone  (DESYREL ) 50 MG tablet Take 1 tablet (50 mg total) by mouth at bedtime. 30 tablet 0    PTA Medications:  Facility Ordered Medications  Medication   acetaminophen  (TYLENOL ) tablet 650 mg   alum & mag hydroxide-simeth (MAALOX/MYLANTA) 200-200-20 MG/5ML suspension 30 mL   magnesium  hydroxide (MILK OF MAGNESIA) suspension 30 mL   multivitamin with minerals tablet 1 tablet   [EXPIRED] chlordiazePOXIDE  (LIBRIUM ) capsule 25 mg   [EXPIRED] hydrOXYzine  (ATARAX ) tablet 25 mg   [EXPIRED] loperamide  (IMODIUM ) capsule 2-4 mg   [EXPIRED] ondansetron  (ZOFRAN -ODT) disintegrating tablet 4 mg   haloperidol  (HALDOL ) tablet 5 mg   And   diphenhydrAMINE  (BENADRYL ) capsule 50 mg   haloperidol  lactate (HALDOL ) injection 5 mg   And   diphenhydrAMINE  (BENADRYL ) injection 50 mg   And   LORazepam  (ATIVAN ) injection 2 mg   haloperidol  lactate (HALDOL ) injection 10 mg   And   diphenhydrAMINE  (BENADRYL ) injection 50 mg   And   LORazepam  (ATIVAN ) injection 2 mg   [COMPLETED] chlordiazePOXIDE  (LIBRIUM ) capsule 25 mg   Followed by   [COMPLETED] chlordiazePOXIDE  (LIBRIUM ) capsule 25 mg   Followed by   [COMPLETED] chlordiazePOXIDE  (LIBRIUM ) capsule 25 mg   Followed by   [COMPLETED] chlordiazePOXIDE  (LIBRIUM ) capsule 25 mg   metFORMIN  (GLUCOPHAGE -XR) 24 hr tablet 500 mg   sertraline  (ZOLOFT ) tablet 50 mg   simvastatin  (ZOCOR ) tablet 10 mg   traZODone  (DESYREL ) tablet 50 mg   ARIPiprazole  (ABILIFY ) tablet 5 mg   naltrexone  (DEPADE) tablet 50 mg   PTA Medications  Medication Sig   ARIPiprazole  (ABILIFY ) 5 MG tablet Take 1 tablet (5 mg total) by mouth at bedtime.   [START ON 07/17/2024] Multiple Vitamin (MULTIVITAMIN WITH MINERALS) TABS tablet Take 1 tablet by mouth daily.   sertraline  (ZOLOFT ) 50 MG tablet Take 1 tablet (50 mg total) by mouth at bedtime.   [START ON 07/17/2024] metFORMIN  (GLUCOPHAGE -XR)  500 MG 24 hr tablet Take 1 tablet (500 mg total) by mouth daily with breakfast.   traZODone  (DESYREL ) 50 MG tablet Take 1 tablet (50 mg total) by mouth at bedtime.   simvastatin  (ZOCOR ) 10 MG tablet Take 1 tablet (10 mg total) by mouth at bedtime.   naltrexone  (DEPADE) 50 MG tablet Take 1 tablet (50 mg total) by mouth daily.       07/16/2024   11:06 AM 07/15/2024    2:59 PM 07/12/2024    2:39 AM  Depression screen PHQ 2/9  Decreased Interest 1 2 0  Down, Depressed, Hopeless 1 2 3   PHQ - 2 Score 2 4 3  Altered sleeping 1 2 2   Tired, decreased energy 1 2 3   Change in appetite 1 2 3   Feeling bad or failure about yourself  1 2 3   Trouble concentrating 1 2 3   Moving slowly or fidgety/restless 1 2 3   Suicidal thoughts 1 2 3   PHQ-9 Score 9 18 23   Difficult doing work/chores Somewhat difficult Very difficult Extremely dIfficult    Flowsheet Row ED from 07/12/2024 in [facility] ED from 07/11/2024 in [facility] ED from 07/10/2024 in [facility] Emergency Department at [facility]  C-SSRS RISK CATEGORY High Risk High Risk High Risk    Musculoskeletal  Strength & Muscle Tone: within normal limits Gait & Station: normal Patient leans: N/A  Psychiatric Specialty Exam  Presentation  General Appearance:  Appropriate for Environment  Eye Contact: Good  Speech: Clear and Coherent  Speech Volume: Normal  Handedness: Right   Mood and Affect  Mood: Euthymic  Affect: Congruent   Thought Process  Thought Processes: Coherent  Descriptions of Associations:Intact  Orientation:Full (Time, Place and Person)  Thought Content:Logical  Diagnosis of Schizophrenia or Schizoaffective disorder in past: No  Duration of Psychotic Symptoms: Less than six months   Hallucinations:Hallucinations: None  Ideas of Reference:None  Suicidal Thoughts:Suicidal Thoughts: No  Homicidal Thoughts:Homicidal Thoughts:  No   Sensorium  Memory: Immediate Good  Judgment: Good  Insight: Good   Executive Functions  Concentration: Good  Attention Span: Good  Recall: Good  Fund of Knowledge: Good  Language: Good   Psychomotor Activity  Psychomotor Activity: Psychomotor Activity: Normal   Assets  Assets: Communication Skills   Sleep  Sleep: Sleep: Good  Estimated Sleeping Duration (Last 24 Hours): 15.50-16.00 hours  Nutritional Assessment (For OBS and FBC admissions only) Has the patient had a weight loss or gain of 10 pounds or more in the last 3 months?: No Has the patient had a decrease in food intake/or appetite?: No Does the patient have dental problems?: No Does the patient have eating habits or behaviors that may be indicators of an eating disorder including binging or inducing vomiting?: No    Physical Exam  Physical Exam Constitutional:      Appearance: Normal appearance.  HENT:     Head: Normocephalic.  Musculoskeletal:     Cervical back: Normal range of motion.  Skin:    General: Skin is warm.  Neurological:     General: No focal deficit present.     Mental Status: He is alert and oriented to person, place, and time.  Psychiatric:        Mood and Affect: Mood normal.        Behavior: Behavior normal.        Thought Content: Thought content normal.        Judgment: Judgment normal.    Review of Systems  Psychiatric/Behavioral:  Positive for depression and substance abuse. Negative for hallucinations, memory loss and suicidal ideas. The patient is nervous/anxious. The patient does not have insomnia.   All other systems reviewed and are negative.  Blood pressure 102/71, pulse 80, temperature 97.9 F (36.6 C), temperature source Oral, resp. rate 18, SpO2 97%. There is no height or weight on file to calculate BMI.  Demographic Factors:  Male  Loss Factors: Financial problems/change in socioeconomic status  Historical Factors: Family history of  mental illness or substance abuse  Risk Reduction Factors:   Positive coping skills or problem solving skills  Continued Clinical Symptoms:  Alcohol/Substance Abuse/Dependencies Previous Psychiatric Diagnoses and Treatments  Cognitive Features That Contribute To Risk:  None    Suicide Risk:  Minimal: No identifiable suicidal ideation.  Patients presenting with minimal risk factors but with morbid ruminations; may be classified as minimal risk based on the severity of the depressive symptoms  Plan Of Care/Follow-up recommendations:  [facility] [place]. [place]  Transportation will be arranged by CSW. Donia Snell, NP 07/16/2024, 11:40 AM

## 2024-07-16 NOTE — Group Note (Signed)
 Group Topic: Decisional Balance/Substance Abuse  Group Date: 07/16/2024 Start Time: 1210 End Time: 1240 Facilitators: Stanly Stabile, RN  Department: [facility]  Number of Participants: 5  Group Focus: abuse issues, anxiety, chemical dependency education, chemical dependency issues, and coping skills Treatment Modality:  Behavior Modification Therapy Interventions utilized were clarification, patient education, problem solving, and reality testing Purpose: enhance coping skills, explore maladaptive thinking, express feelings, express irrational fears, regain self-worth, reinforce self-care, relapse prevention strategies, and trigger / craving management  Name: Aaron [name] Date of Birth: 09/22/1976  MR: [id number]    Level of Participation: did not attend Quality of Participation:  Interactions with others:  Mood/Affect:  Triggers (if applicable):  Cognition:  Progress:  Response:  Plan:   Patients Problems:  Patient Active Problem List   Diagnosis Date Noted   Homelessness 07/16/2024   Alcohol abuse with intoxication, uncomplicated (HCC) 07/12/2024   Malingering 07/11/2024   MDD (major depressive disorder), recurrent severe, without psychosis (HCC) 04/25/2024   Suicidal ideation    Alcohol withdrawal without perceptual disturbances (HCC) 04/08/2021   Alcohol use disorder, severe, dependence (HCC) 04/08/2021   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
